# Patient Record
Sex: Female | Born: 1980
Health system: Southern US, Community
[De-identification: ages and names within clinical notes are randomized; demographics above are authoritative.]

## PROBLEM LIST (undated history)

## (undated) ENCOUNTER — Inpatient Hospital Stay (HOSPITAL_COMMUNITY): Payer: Self-pay

## (undated) DIAGNOSIS — F419 Anxiety disorder, unspecified: Secondary | ICD-10-CM

## (undated) DIAGNOSIS — R112 Nausea with vomiting, unspecified: Secondary | ICD-10-CM

## (undated) DIAGNOSIS — E669 Obesity, unspecified: Secondary | ICD-10-CM

## (undated) DIAGNOSIS — I499 Cardiac arrhythmia, unspecified: Secondary | ICD-10-CM

## (undated) DIAGNOSIS — B019 Varicella without complication: Secondary | ICD-10-CM

## (undated) DIAGNOSIS — Z9889 Other specified postprocedural states: Secondary | ICD-10-CM

## (undated) DIAGNOSIS — E785 Hyperlipidemia, unspecified: Secondary | ICD-10-CM

## (undated) DIAGNOSIS — J45909 Unspecified asthma, uncomplicated: Secondary | ICD-10-CM

## (undated) DIAGNOSIS — K219 Gastro-esophageal reflux disease without esophagitis: Secondary | ICD-10-CM

## (undated) DIAGNOSIS — R87629 Unspecified abnormal cytological findings in specimens from vagina: Secondary | ICD-10-CM

## (undated) HISTORY — DX: Unspecified asthma, uncomplicated: J45.909

## (undated) HISTORY — DX: Anxiety disorder, unspecified: F41.9

## (undated) HISTORY — DX: Varicella without complication: B01.9

## (undated) HISTORY — DX: Hyperlipidemia, unspecified: E78.5

## (undated) HISTORY — DX: Unspecified abnormal cytological findings in specimens from vagina: R87.629

## (undated) HISTORY — DX: Obesity, unspecified: E66.9

---
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
## 2002-09-02 ENCOUNTER — Emergency Department (HOSPITAL_COMMUNITY): Admission: EM | Admit: 2002-09-02 | Discharge: 2002-09-02 | Payer: Self-pay | Admitting: Emergency Medicine

## 2004-07-28 ENCOUNTER — Ambulatory Visit (HOSPITAL_COMMUNITY): Admission: RE | Admit: 2004-07-28 | Discharge: 2004-07-28 | Payer: Self-pay | Admitting: Obstetrics & Gynecology

## 2004-12-12 ENCOUNTER — Ambulatory Visit (HOSPITAL_COMMUNITY): Admission: RE | Admit: 2004-12-12 | Discharge: 2004-12-12 | Payer: Self-pay | Admitting: Obstetrics & Gynecology

## 2004-12-13 ENCOUNTER — Inpatient Hospital Stay (HOSPITAL_COMMUNITY): Admission: RE | Admit: 2004-12-13 | Discharge: 2004-12-17 | Payer: Self-pay | Admitting: Obstetrics

## 2005-12-17 HISTORY — PX: CHOLECYSTECTOMY: SHX55

## 2006-01-26 ENCOUNTER — Observation Stay (HOSPITAL_COMMUNITY): Admission: EM | Admit: 2006-01-26 | Discharge: 2006-01-27 | Payer: Self-pay | Admitting: Emergency Medicine

## 2012-08-11 ENCOUNTER — Ambulatory Visit (INDEPENDENT_AMBULATORY_CARE_PROVIDER_SITE_OTHER): Payer: Federal, State, Local not specified - PPO | Admitting: Family Medicine

## 2012-08-11 ENCOUNTER — Encounter: Payer: Self-pay | Admitting: Family Medicine

## 2012-08-11 ENCOUNTER — Other Ambulatory Visit (HOSPITAL_COMMUNITY)
Admission: RE | Admit: 2012-08-11 | Discharge: 2012-08-11 | Disposition: A | Discharge status: Home / Self Care | Payer: Federal, State, Local not specified - PPO | Source: Ambulatory Visit | Attending: Family Medicine | Admitting: Family Medicine

## 2012-08-11 VITALS — BP 118/78 | HR 81 | Temp 99.2°F | Ht 63.25 in | Wt 200.6 lb

## 2012-08-11 DIAGNOSIS — L309 Dermatitis, unspecified: Secondary | ICD-10-CM | POA: Insufficient documentation

## 2012-08-11 DIAGNOSIS — Z01419 Encounter for gynecological examination (general) (routine) without abnormal findings: Secondary | ICD-10-CM

## 2012-08-11 DIAGNOSIS — Z6829 Body mass index (BMI) 29.0-29.9, adult: Secondary | ICD-10-CM | POA: Insufficient documentation

## 2012-08-11 DIAGNOSIS — L259 Unspecified contact dermatitis, unspecified cause: Secondary | ICD-10-CM

## 2012-08-11 DIAGNOSIS — E669 Obesity, unspecified: Secondary | ICD-10-CM

## 2012-08-11 DIAGNOSIS — N644 Mastodynia: Secondary | ICD-10-CM

## 2012-08-11 DIAGNOSIS — Z124 Encounter for screening for malignant neoplasm of cervix: Secondary | ICD-10-CM

## 2012-08-11 LAB — CBC WITH DIFFERENTIAL/PLATELET
Basophils Absolute: 0 10*3/uL (ref 0.0–0.1)
Basophils Relative: 0.7 % (ref 0.0–3.0)
Eosinophils Absolute: 0.1 10*3/uL (ref 0.0–0.7)
Eosinophils Relative: 1.6 % (ref 0.0–5.0)
HCT: 36.7 % (ref 36.0–46.0)
Hemoglobin: 11.8 g/dL — ABNORMAL LOW (ref 12.0–15.0)
Lymphocytes Relative: 27.9 % (ref 12.0–46.0)
Lymphs Abs: 1.5 10*3/uL (ref 0.7–4.0)
MCHC: 32.1 g/dL (ref 30.0–36.0)
MCV: 83.9 fl (ref 78.0–100.0)
Monocytes Absolute: 0.5 10*3/uL (ref 0.1–1.0)
Monocytes Relative: 10 % (ref 3.0–12.0)
Neutro Abs: 3.3 10*3/uL (ref 1.4–7.7)
Neutrophils Relative %: 59.8 % (ref 43.0–77.0)
Platelets: 292 10*3/uL (ref 150.0–400.0)
RBC: 4.37 Mil/uL (ref 3.87–5.11)
RDW: 13.3 % (ref 11.5–14.6)
WBC: 5.4 10*3/uL (ref 4.5–10.5)

## 2012-08-11 LAB — LIPID PANEL
Cholesterol: 195 mg/dL (ref 0–200)
HDL: 45.8 mg/dL (ref >39.00)
LDL Cholesterol: 141 mg/dL — ABNORMAL HIGH (ref 0–99)
Total CHOL/HDL Ratio: 4
Triglycerides: 42 mg/dL (ref 0.0–149.0)
VLDL: 8.4 mg/dL (ref 0.0–40.0)

## 2012-08-11 LAB — BASIC METABOLIC PANEL
BUN: 8 mg/dL (ref 6–23)
CO2: 22 mEq/L (ref 19–32)
Calcium: 9 mg/dL (ref 8.4–10.5)
Chloride: 107 mEq/L (ref 96–112)
Creatinine, Ser: 0.6 mg/dL (ref 0.4–1.2)
GFR: 141.28 mL/min (ref >60.00)
Glucose, Bld: 85 mg/dL (ref 70–99)
Potassium: 3.7 mEq/L (ref 3.5–5.1)
Sodium: 139 mEq/L (ref 135–145)

## 2012-08-11 LAB — HEPATIC FUNCTION PANEL
ALT: 28 U/L (ref 0–35)
AST: 24 U/L (ref 0–37)
Albumin: 3.7 g/dL (ref 3.5–5.2)
Alkaline Phosphatase: 92 U/L (ref 39–117)
Bilirubin, Direct: 0.1 mg/dL (ref 0.0–0.3)
Total Bilirubin: 0.4 mg/dL (ref 0.3–1.2)
Total Protein: 7 g/dL (ref 6.0–8.3)

## 2012-08-11 LAB — TSH: TSH: 1.82 u[IU]/mL (ref 0.35–5.50)

## 2012-08-11 MED ORDER — TRIAMCINOLONE ACETONIDE 0.1 % EX OINT: TOPICAL_OINTMENT | Freq: Two times a day (BID) | CUTANEOUS | Status: DC

## 2012-08-11 NOTE — Assessment & Plan Note (Signed)
New to provider.  Chronic for pt.  Encouraged healthy diet, regular eerxerc

## 2012-08-11 NOTE — Assessment & Plan Note (Signed)
New.  Start triamcinolone prn.

## 2012-08-11 NOTE — Progress Notes (Signed)
  Subjective:    Patient ID: Savannah Henry, female    DOB: 16-Aug-1981, 31 y.o.   MRN: [id number]  HPI New to establish.  Previous MD- [facility] [place], [place]  Wants CPE.  Reports breast tenderness over the nipples bilaterally.  No discharge, no lumps, no skin changes.  MGM and maternal aunts w/ breast cancer- dx'd in 40-50s.  Review of Systems Patient reports no vision/ hearing changes, adenopathy,fever, weight change,  persistant/recurrent hoarseness , swallowing issues, chest pain, palpitations, edema, persistant/recurrent cough, hemoptysis, dyspnea (rest/exertional/paroxysmal nocturnal), gastrointestinal bleeding (melena, rectal bleeding), abdominal pain, significant heartburn, bowel changes, GU symptoms (dysuria, hematuria, incontinence), Gyn symptoms (abnormal  bleeding, pain),  syncope, focal weakness, memory loss, numbness & tingling, skin/hair/nail changes, abnormal bruising or bleeding, anxiety, or depression.     Objective:   Physical Exam  General Appearance:    Alert, cooperative, no distress, appears stated age  Head:    Normocephalic, without obvious abnormality, atraumatic  Eyes:    PERRL, conjunctiva/corneas clear, EOM's intact, fundi    benign, both eyes  Ears:    Normal TM's and external ear canals, both ears  Nose:   Nares normal, septum midline, mucosa normal, no drainage    or sinus tenderness  Throat:   Lips, mucosa, and tongue normal; teeth and gums normal  Neck:   Supple, symmetrical, trachea midline, no adenopathy;    Thyroid: no enlargement/tenderness/nodules  Back:     Symmetric, no curvature, ROM normal, no CVA tenderness  Lungs:     Clear to auscultation bilaterally, respirations unlabored  Chest Wall:    No tenderness or deformity   Heart:    Regular rate and rhythm, S1 and S2 normal, no murmur, rub   or gallop  Breast Exam:    No tenderness, masses, or nipple abnormality  Abdomen:     Soft, non-tender, bowel sounds active all four quadrants,    no masses, no  organomegaly  Genitalia:    External genitalia normal, cervix normal in appearance, no CMT, uterus in normal size and position, adnexa w/out mass or tenderness, mucosa pink and moist, no lesions or discharge present  Rectal:    Normal external appearance  Extremities:   Extremities normal, atraumatic, no cyanosis or edema  Pulses:   2+ and symmetric all extremities  Skin:   Skin color, texture, turgor normal, eczematous patch on R lateral lower leg  Lymph nodes:   Cervical, supraclavicular, and axillary nodes normal  Neurologic:   CNII-XII intact, normal strength, sensation and reflexes    throughout          Assessment & Plan:

## 2012-08-11 NOTE — Patient Instructions (Addendum)
We'll notify you of your lab results and make any changes if needed Someone will call you with your mammogram and nutrition appts Try and get regular exercise and make healthy food choices Call with any questions or concerns Welcome!  We're glad to have you!

## 2012-08-11 NOTE — Assessment & Plan Note (Signed)
New.  No abnormalities on PE but given family hx of breast cancer at young age will get diagnostic mammo.  Pt expressed understanding and is in agreement w/ plan.

## 2012-08-13 LAB — VITAMIN D 1,25 DIHYDROXY
Vitamin D 1, 25 (OH)2 Total: 78 pg/mL — ABNORMAL HIGH (ref 18–72)
Vitamin D2 1, 25 (OH)2: <8 pg/mL
Vitamin D3 1, 25 (OH)2: 78 pg/mL

## 2012-08-15 ENCOUNTER — Ambulatory Visit
Admission: RE | Admit: 2012-08-15 | Discharge: 2012-08-15 | Disposition: A | Discharge status: Home / Self Care | Payer: Federal, State, Local not specified - PPO | Source: Ambulatory Visit | Attending: Family Medicine | Admitting: Family Medicine

## 2012-08-15 ENCOUNTER — Encounter: Payer: Self-pay | Admitting: *Deleted

## 2012-08-15 DIAGNOSIS — N644 Mastodynia: Secondary | ICD-10-CM

## 2012-08-24 NOTE — Assessment & Plan Note (Signed)
Pap collected. 

## 2012-08-24 NOTE — Assessment & Plan Note (Signed)
New.  Pt's PE WNL w/ exception of breast tenderness.  Check labs.  Anticipatory guidance provided.

## 2012-09-25 ENCOUNTER — Encounter: Payer: Self-pay | Admitting: *Deleted

## 2012-09-25 ENCOUNTER — Encounter: Payer: Federal, State, Local not specified - PPO | Attending: Family Medicine | Admitting: *Deleted

## 2012-09-25 VITALS — Ht 62.75 in | Wt 203.0 lb

## 2012-09-25 DIAGNOSIS — E669 Obesity, unspecified: Secondary | ICD-10-CM

## 2012-09-25 DIAGNOSIS — Z713 Dietary counseling and surveillance: Secondary | ICD-10-CM | POA: Insufficient documentation

## 2012-09-25 NOTE — Patient Instructions (Signed)
Eat meals that you prepare for your daughter  With your daughter

## 2012-09-25 NOTE — Progress Notes (Signed)
  Medical Nutrition Therapy:  Appt start time: 1730 end time:  1830.   Assessment:  Primary concerns today: obesity.   MEDICATIONS: see list   DIETARY INTAKE:  Usual eating pattern includes 0-3 meals and multiple snacks per day.  Everyday foods include energy-dense snacks.  Avoided foods include none.    24-hr recall:  B ( AM): may skip; [place] breakfast biscuit with egg, cheese, bacon and hash brown and sweet tea  Snk ( AM): may grab doughnut or danish or other pastry  L ( PM): chips with cucumber salad and gyro meat with water; or may skip Snk ( PM): vending machine chips or fast food D ( PM): snacks while cooking and does eat dinner  Or may nibble child's dinner Snk ( PM): none Beverages: tea or water or may not drink anything  Usual physical activity: none  Estimated energy needs: 1500 calories 170 g carbohydrates 112 g protein 42 g fat  Progress Towards Goal(s):  In progress.   Nutritional Diagnosis:  [place]-3.3 Overweight/obesity As related to irregular meal pattern and consumption of enery-dense foods.  As evidenced by BMI of 36.2.    Intervention:  Nutrition counseling provided.  Savannah Henry is here because she is obese.  She admits to gaining about 20 pounds in 10 months due to job change and other personal stresses.  She admits to emotional eating and making energy-dense food choices when she is upset or emotional.  She has tried various fad diets and intense exercise routines in the past, but gained the weight back.  Explained to her the effects of restrictive diets and intense exercise routines: decreased caloric intake so low that metabolic rate slows down and fat storage capacity increases so that when she does eat again- goes off diet or stops exercising- she gains more weight and more weight as fat. Savannah Henry's current meal pattern is reminiscent of starvation.  She skips at least 2 meals a day and then binges when she's so hungry she can't help it.  She admits to low  energy level.  She has a young daughter, whom she takes excellent care of.  She makes sure that her daughter has a nutritious breakfast, lunch, and dinner.  She makes sure her daughter stays properly hydrated.  She takes the time for her daughter, but doesn't do the same thing for herself.  She will watch her daughter eat the meal she prepares, but won't eat with her.  Challenged her to take care of herself too.  Told Savannah Henry that she is just as important as her daughter and that in order to take good care of her daughter, she needs to be able to take good care of herself.  Asked the daughter (who was at the appointment today) would she please be willing to help her mother prepare meals so that Savannah Henry might have time to eat herself?  Savannah Henry already is preparing 3 meals a day, encouraged her to double the portions so that she may eat as well.  Encouraged her to plan ahead and prepare meals in advance, if needed, so that she will have nutritious meals ready and wont' have to deny or delay eating until she's so hungry she binges on energy-dense, nutrient-poor foods.  Reassured her that she is worth the effort, empowered her to make healthy meal choices and solicited daughter's help in encouraging Savannah Henry to eat at meal times.    Monitoring/Evaluation:  Dietary intake, exercise, and body weight prn.

## 2012-10-09 ENCOUNTER — Encounter: Payer: Self-pay | Admitting: Family Medicine

## 2012-10-09 ENCOUNTER — Ambulatory Visit (INDEPENDENT_AMBULATORY_CARE_PROVIDER_SITE_OTHER): Payer: Federal, State, Local not specified - PPO | Admitting: Family Medicine

## 2012-10-09 VITALS — BP 135/96 | HR 86 | Temp 98.4°F | Ht 63.5 in | Wt 200.6 lb

## 2012-10-09 DIAGNOSIS — F418 Other specified anxiety disorders: Secondary | ICD-10-CM | POA: Insufficient documentation

## 2012-10-09 DIAGNOSIS — F341 Dysthymic disorder: Secondary | ICD-10-CM

## 2012-10-09 MED ORDER — ESCITALOPRAM OXALATE 10 MG PO TABS: 10.0000 mg | ORAL_TABLET | Freq: Every day | ORAL | Status: DC

## 2012-10-09 MED ORDER — ALPRAZOLAM 0.5 MG PO TABS: 0.5000 mg | ORAL_TABLET | Freq: Three times a day (TID) | ORAL | Status: DC | PRN

## 2012-10-09 NOTE — Patient Instructions (Addendum)
Follow up in 1 month to recheck mood Start Lexapro daily to control depression/anxiety Use the Xanax as needed for those panic moments- 1/2 or whole tab Continue to exercise as a stress outlet Call with any questions or concerns Hang in there!!!

## 2012-10-09 NOTE — Progress Notes (Signed)
  Subjective:    Patient ID: Savannah Henry, female    DOB: 23-Aug-1981, 31 y.o.   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  Pt was working w/out getting paid.  Was supposed to get back pay on Monday but did not get full amount.  Started having palpitations, severe HA- 'it's just too much'.  Is living 'check to check' and lack of pay is huge strain.  Work is expecting more from pt.  'i can't stop crying' including at work.  Is feeling unprofessional.  Typically sleeping well but lately has been waking between 1-2 am.  Able to go back to sleep but not having restful sleep.  Pt w/ hx of anxiety/depression.  'i don't want to be that person that needs meds'.   Review of Systems     Objective:   Physical Exam  Vitals reviewed. Constitutional: She is oriented to person, place, and time. She appears well-developed and well-nourished.       tearful  HENT:  Head: Normocephalic and atraumatic.  Neck: Normal range of motion. Neck supple. No thyromegaly present.  Cardiovascular: Normal rate, regular rhythm and normal heart sounds.   Pulmonary/Chest: Effort normal and breath sounds normal. No respiratory distress. She has no wheezes. She has no rales.  Musculoskeletal: She exhibits no edema.  Neurological: She is alert and oriented to person, place, and time.  Psychiatric:       Anxious, tearful          Assessment & Plan:

## 2012-10-09 NOTE — Assessment & Plan Note (Signed)
New.  Severe.  Keeping pt from functioning at work.  Will start daily controller med and add benzo prn.  Will follow closely.  Pt expressed understanding and is in agreement w/ plan.

## 2012-12-11 ENCOUNTER — Encounter: Payer: Self-pay | Admitting: Family Medicine

## 2012-12-11 ENCOUNTER — Ambulatory Visit (INDEPENDENT_AMBULATORY_CARE_PROVIDER_SITE_OTHER): Payer: Federal, State, Local not specified - PPO | Admitting: Family Medicine

## 2012-12-11 VITALS — BP 108/70 | HR 82 | Temp 98.6°F | Ht 63.75 in | Wt 206.6 lb

## 2012-12-11 DIAGNOSIS — H9209 Otalgia, unspecified ear: Secondary | ICD-10-CM

## 2012-12-11 DIAGNOSIS — N912 Amenorrhea, unspecified: Secondary | ICD-10-CM

## 2012-12-11 LAB — POCT URINE PREGNANCY: Preg Test, Ur: POSITIVE

## 2012-12-11 NOTE — Progress Notes (Signed)
  Subjective:    Patient ID: Savannah Henry, female    DOB: [birth year], 31 y.o.   MRN: [id number]  HPI Ear pain- R ear pain, sxs started 1 week ago.  Some decreased hearing, swelling externally.  Felt a bump in EAC but this resolved.  Difficulty lying on side b/c ear feels like it's filling w/ fluid.  No drainage from ear.  No fevers.  No increase in nasal congestion or sinus drainage.  + sick contacts- daughter w/ ear infxn.  Amenorrhea- pt's last period was 10/28/12.  Not currently on birth control.  'maybe we better check a test'.   Review of Systems For ROS see HPI     Objective:   Physical Exam  Vitals reviewed. Constitutional: She is oriented to person, place, and time. She appears well-developed and well-nourished. No distress.  HENT:  Head: Normocephalic and atraumatic.  Right Ear: Tympanic membrane normal.  Left Ear: Tympanic membrane normal.  Nose: Mucosal edema and rhinorrhea present. Right sinus exhibits no maxillary sinus tenderness and no frontal sinus tenderness. Left sinus exhibits no maxillary sinus tenderness and no frontal sinus tenderness.  Mouth/Throat: Mucous membranes are normal. Posterior oropharyngeal erythema (w/ PND) present.       R ear w/ cerumen impaction- TM WNL s/p irrigation and curetting L TM WNL  Eyes: Conjunctivae normal and EOM are normal. Pupils are equal, round, and reactive to light.  Neck: Normal range of motion. Neck supple.  Cardiovascular: Normal rate, regular rhythm and normal heart sounds.   Pulmonary/Chest: Effort normal and breath sounds normal. No respiratory distress. She has no wheezes. She has no rales.  Lymphadenopathy:    She has no cervical adenopathy.  Neurological: She is alert and oriented to person, place, and time.  Psychiatric: She has a normal mood and affect. Her behavior is normal. Thought content normal.          Assessment & Plan:

## 2012-12-11 NOTE — Patient Instructions (Addendum)
The hearing loss and ear pain was b/c the wax was preventing the ear drum from moving the way it was supposed and could not regulate the pressure Drink plenty of fluids Tylenol for pain We'll call you with your GYN appt CONGRATS!!! Happy New Year!!!

## 2012-12-12 NOTE — Assessment & Plan Note (Signed)
New.  Pt's discomfort and muffled hearing improved w/ irrigation and removal of cerumen impaction.  Reviewed supportive care and red flags that should prompt return.  Pt expressed understanding and is in agreement w/ plan.

## 2012-12-12 NOTE — Assessment & Plan Note (Signed)
New.  Pt is pregnant.  No local OB.  Will refer.  Encouraged her to start daily OTC PNV.  Pt is shocked but pleased.

## 2012-12-15 ENCOUNTER — Telehealth: Payer: Self-pay | Admitting: Family Medicine

## 2012-12-15 NOTE — Telephone Encounter (Signed)
Estimated due date- Aug 04, 2013

## 2012-12-15 NOTE — Telephone Encounter (Signed)
Patient states she has made appt w/Dr. Tamela Oddi at [facility]. The physician's office is also asking for an estimated due date. Call patient back at [phone]

## 2012-12-16 NOTE — Telephone Encounter (Signed)
Discuss with patient  

## 2012-12-30 ENCOUNTER — Ambulatory Visit (INDEPENDENT_AMBULATORY_CARE_PROVIDER_SITE_OTHER): Payer: Federal, State, Local not specified - PPO

## 2012-12-30 DIAGNOSIS — Z23 Encounter for immunization: Secondary | ICD-10-CM

## 2013-01-12 LAB — OB RESULTS CONSOLE ABO/RH: RH Type: POSITIVE

## 2013-01-12 LAB — OB RESULTS CONSOLE PLATELET COUNT: Platelets: 297 10*3/uL

## 2013-01-12 LAB — OB RESULTS CONSOLE HIV ANTIBODY (ROUTINE TESTING): HIV: NONREACTIVE

## 2013-01-12 LAB — OB RESULTS CONSOLE GC/CHLAMYDIA
Chlamydia: NEGATIVE
Gonorrhea: NEGATIVE

## 2013-01-12 LAB — OB RESULTS CONSOLE HGB/HCT, BLOOD: HCT: 38 %

## 2013-01-12 LAB — OB RESULTS CONSOLE RPR: RPR: NONREACTIVE

## 2013-01-12 LAB — OB RESULTS CONSOLE RUBELLA ANTIBODY, IGM: Rubella: IMMUNE

## 2013-01-12 LAB — OB RESULTS CONSOLE HEPATITIS B SURFACE ANTIGEN: Hepatitis B Surface Ag: NEGATIVE

## 2013-02-23 ENCOUNTER — Other Ambulatory Visit: Payer: Self-pay | Admitting: Obstetrics & Gynecology

## 2013-02-23 DIAGNOSIS — Z3480 Encounter for supervision of other normal pregnancy, unspecified trimester: Secondary | ICD-10-CM

## 2013-03-03 ENCOUNTER — Ambulatory Visit (INDEPENDENT_AMBULATORY_CARE_PROVIDER_SITE_OTHER): Payer: BC Managed Care – PPO

## 2013-03-03 DIAGNOSIS — Z3480 Encounter for supervision of other normal pregnancy, unspecified trimester: Secondary | ICD-10-CM

## 2013-03-03 LAB — US OB DETAIL + 14 WK

## 2013-03-06 ENCOUNTER — Encounter: Payer: Self-pay | Admitting: Obstetrics & Gynecology

## 2013-03-06 DIAGNOSIS — Z3482 Encounter for supervision of other normal pregnancy, second trimester: Secondary | ICD-10-CM

## 2013-03-20 ENCOUNTER — Encounter (HOSPITAL_COMMUNITY): Payer: Self-pay | Admitting: *Deleted

## 2013-03-20 ENCOUNTER — Inpatient Hospital Stay (HOSPITAL_COMMUNITY)
Admission: AD | Admit: 2013-03-20 | Discharge: 2013-03-20 | Disposition: A | Discharge status: Home / Self Care | Payer: Federal, State, Local not specified - PPO | Source: Ambulatory Visit | Attending: Obstetrics | Admitting: Obstetrics

## 2013-03-20 DIAGNOSIS — R109 Unspecified abdominal pain: Secondary | ICD-10-CM | POA: Insufficient documentation

## 2013-03-20 DIAGNOSIS — B9689 Other specified bacterial agents as the cause of diseases classified elsewhere: Secondary | ICD-10-CM | POA: Insufficient documentation

## 2013-03-20 DIAGNOSIS — O2390 Unspecified genitourinary tract infection in pregnancy, unspecified trimester: Secondary | ICD-10-CM | POA: Insufficient documentation

## 2013-03-20 DIAGNOSIS — N760 Acute vaginitis: Secondary | ICD-10-CM | POA: Insufficient documentation

## 2013-03-20 DIAGNOSIS — A499 Bacterial infection, unspecified: Secondary | ICD-10-CM | POA: Insufficient documentation

## 2013-03-20 LAB — URINALYSIS, ROUTINE W REFLEX MICROSCOPIC
Bilirubin Urine: NEGATIVE
Glucose, UA: NEGATIVE mg/dL
Hgb urine dipstick: NEGATIVE
Ketones, ur: 15 mg/dL — AB
Leukocytes, UA: NEGATIVE
Nitrite: NEGATIVE
Protein, ur: NEGATIVE mg/dL
Specific Gravity, Urine: >1.03 — ABNORMAL HIGH (ref 1.005–1.030)
Urobilinogen, UA: 0.2 mg/dL (ref 0.0–1.0)
pH: 5.5 (ref 5.0–8.0)

## 2013-03-20 LAB — WET PREP, GENITAL
Trich, Wet Prep: NONE SEEN
Yeast Wet Prep HPF POC: NONE SEEN

## 2013-03-20 MED ORDER — ACETAMINOPHEN 500 MG PO TABS
1000.0000 mg | ORAL_TABLET | Freq: Once | ORAL | Status: AC
  Administered 2013-03-20: 1000 mg via ORAL
  Filled 2013-03-20: qty 2

## 2013-03-20 MED ORDER — METRONIDAZOLE 500 MG PO TABS: 500.0000 mg | ORAL_TABLET | Freq: Two times a day (BID) | ORAL | Status: DC

## 2013-03-20 NOTE — MAU Provider Note (Signed)
History     CSN: [id number]  Arrival date and time: 03/20/13 2003   First Provider Initiated Contact with Patient 03/20/13 2043      Chief Complaint  Patient presents with  . Flank Pain  . Abdominal Pain   HPI  Savannah Henry is a 32 y.o. G3P1011 at [redacted]w[redacted]d who presents today with abdominal pain X 48 hours. She states she took Tylenol today, and it did not help. She denies LOF and VB.   Past Medical History  Diagnosis Date  . Asthma   . Chicken pox   . Obesity   . Hyperlipidemia   . Anxiety     Past Surgical History  Procedure Laterality Date  . Cholecystectomy  2007  . Cesarean section  2005    Family History  Problem Relation Age of Onset  . Arthritis Mother   . ALS Father   . Breast cancer Maternal Aunt   . Lung cancer Maternal Uncle   . Diabetes Paternal Aunt   . Arthritis Maternal Grandmother   . Breast cancer Maternal Grandmother   . Heart disease Paternal Grandmother   . Hypertension Paternal Grandmother   . Diabetes Paternal Grandfather     History  Substance Use Topics  . Smoking status: Never Smoker   . Smokeless tobacco: Never Used  . Alcohol Use: No    Allergies: No Known Allergies  Prescriptions prior to admission  Medication Sig Dispense Refill  . acetaminophen (TYLENOL) 500 MG tablet Take 1,000 mg by mouth every 6 (six) hours as needed for pain.      . Prenatal Vit-Fe Fumarate-FA (PRENATAL MULTIVITAMIN) TABS Take 1 tablet by mouth daily at 12 noon.        Review of Systems  Constitutional: Negative for fever and chills.  Eyes: Negative for blurred vision.  Gastrointestinal: Positive for abdominal pain and constipation (last BM was yesterday). Negative for nausea, vomiting and diarrhea.  Genitourinary: Negative for dysuria, urgency and frequency.  Musculoskeletal: Negative for myalgias.  Neurological: Negative for dizziness and headaches.   Physical Exam   Blood pressure 116/64, pulse 82, temperature 98.4 F (36.9 C), resp. rate 20,  height 5' 2.5" (1.588 m), weight 88.179 kg (194 lb 6.4 oz), last menstrual period 10/28/2012, SpO2 100.00%.  Physical Exam  Nursing note and vitals reviewed. Constitutional: She is oriented to person, place, and time. She appears well-developed and well-nourished.  Cardiovascular: Normal rate.   Respiratory: Effort normal.  GI: Soft.  Neurological: She is alert and oriented to person, place, and time.  Skin: Skin is warm and dry.  Psychiatric: She has a normal mood and affect.    MAU Course  Procedures  Results for orders placed during the hospital encounter of 03/20/13 (from the past 24 hour(s))  URINALYSIS, ROUTINE W REFLEX MICROSCOPIC     Status: Abnormal   Collection Time    03/20/13  8:20 PM      Result Value Range   Color, Urine YELLOW  YELLOW   APPearance CLEAR  CLEAR   Specific Gravity, Urine >1.030 (*) 1.005 - 1.030   pH 5.5  5.0 - 8.0   Glucose, UA NEGATIVE  NEGATIVE mg/dL   Hgb urine dipstick NEGATIVE  NEGATIVE   Bilirubin Urine NEGATIVE  NEGATIVE   Ketones, ur 15 (*) NEGATIVE mg/dL   Protein, ur NEGATIVE  NEGATIVE mg/dL   Urobilinogen, UA 0.2  0.0 - 1.0 mg/dL   Nitrite NEGATIVE  NEGATIVE   Leukocytes, UA NEGATIVE  NEGATIVE  WET PREP,  GENITAL     Status: Abnormal   Collection Time    03/20/13  9:00 PM      Result Value Range   Yeast Wet Prep HPF POC NONE SEEN  NONE SEEN   Trich, Wet Prep NONE SEEN  NONE SEEN   Clue Cells Wet Prep HPF POC MODERATE (*) NONE SEEN   WBC, Wet Prep HPF POC MODERATE (*) NONE SEEN   2223: Spoke with Dr. Clearance Coots, treat BV and DC patient home.   Assessment and Plan   1. BV (bacterial vaginosis)    RX flagyl 500mg  po bid x 7 days FU with Dr. Clearance Coots as scheduled  Tawnya Crook 03/20/2013, 8:44 PM

## 2013-03-20 NOTE — MAU Note (Signed)
Pain in R flank that wraps around to R lower abdomen. Tylenol and heat not helping. Started last night.

## 2013-03-21 LAB — GC/CHLAMYDIA PROBE AMP
CT Probe RNA: NEGATIVE
GC Probe RNA: NEGATIVE

## 2013-04-01 ENCOUNTER — Ambulatory Visit (INDEPENDENT_AMBULATORY_CARE_PROVIDER_SITE_OTHER): Payer: BC Managed Care – PPO

## 2013-04-01 DIAGNOSIS — Z3482 Encounter for supervision of other normal pregnancy, second trimester: Secondary | ICD-10-CM

## 2013-04-01 DIAGNOSIS — Z3480 Encounter for supervision of other normal pregnancy, unspecified trimester: Secondary | ICD-10-CM

## 2013-04-01 LAB — US OB DETAIL + 14 WK

## 2013-04-02 ENCOUNTER — Encounter: Payer: Self-pay | Admitting: Obstetrics & Gynecology

## 2013-04-09 ENCOUNTER — Ambulatory Visit (INDEPENDENT_AMBULATORY_CARE_PROVIDER_SITE_OTHER): Payer: BC Managed Care – PPO | Admitting: Obstetrics & Gynecology

## 2013-04-09 ENCOUNTER — Encounter: Payer: Self-pay | Admitting: Obstetrics & Gynecology

## 2013-04-09 VITALS — BP 106/70 | Temp 97.8°F | Wt 196.0 lb

## 2013-04-09 DIAGNOSIS — B373 Candidiasis of vulva and vagina: Secondary | ICD-10-CM

## 2013-04-09 DIAGNOSIS — Z3482 Encounter for supervision of other normal pregnancy, second trimester: Secondary | ICD-10-CM

## 2013-04-09 DIAGNOSIS — Z98891 History of uterine scar from previous surgery: Secondary | ICD-10-CM

## 2013-04-09 DIAGNOSIS — E559 Vitamin D deficiency, unspecified: Secondary | ICD-10-CM

## 2013-04-09 DIAGNOSIS — Z3480 Encounter for supervision of other normal pregnancy, unspecified trimester: Secondary | ICD-10-CM

## 2013-04-09 DIAGNOSIS — Z9889 Other specified postprocedural states: Secondary | ICD-10-CM

## 2013-04-09 LAB — POCT URINALYSIS DIPSTICK
Bilirubin, UA: NEGATIVE
Blood, UA: NEGATIVE
Glucose, UA: NEGATIVE
Leukocytes, UA: NEGATIVE
Nitrite, UA: NEGATIVE
Spec Grav, UA: 1.02
Urobilinogen, UA: NEGATIVE
pH, UA: 5

## 2013-04-09 MED ORDER — TERCONAZOLE 0.4 % VA CREA: 1.0000 | TOPICAL_CREAM | Freq: Every day | VAGINAL | Status: DC

## 2013-04-09 NOTE — Progress Notes (Signed)
Pulse: 90

## 2013-04-09 NOTE — Patient Instructions (Signed)
Glucose Tolerance Test This is a test to see how your body processes carbohydrates. This test is often done to check patients for diabetes or the possibility of developing it. PREPARATION FOR TEST You should have nothing to eat or drink 12 hours before the test. You will be given a form of sugar (glucose) and then blood samples will be drawn from your vein to determine the level of sugar in your blood. Alternatively, blood may be drawn from your finger for testing. You should not smoke or exercise during the test. NORMAL FINDINGS  Fasting: 70-115 mg/dL  30 minutes: less than 200 mg/dL  1 hour: less than 200 mg/dL  2 hours: less than 140 mg/dL  3 hours: 70-115 mg/dL  4 hours: 70-115 mg/dL Ranges for normal findings may vary among different laboratories and hospitals. You should always check with your doctor after having lab work or other tests done to discuss the meaning of your test results and whether your values are considered within normal limits. MEANING OF TEST Your caregiver will go over the test results with you and discuss the importance and meaning of your results, as well as treatment options and the need for additional tests. OBTAINING THE TEST RESULTS It is your responsibility to obtain your test results. Ask the lab or department performing the test when and how you will get your results. Document Released: 12/26/2004 Document Revised: 02/25/2012 Document Reviewed: 11/13/2008 ExitCare Patient Information 2013 [employer].  

## 2013-04-09 NOTE — Progress Notes (Signed)
Likely candida vulvovaginitis. 

## 2013-04-14 ENCOUNTER — Encounter: Payer: Self-pay | Admitting: *Deleted

## 2013-05-01 ENCOUNTER — Ambulatory Visit (INDEPENDENT_AMBULATORY_CARE_PROVIDER_SITE_OTHER): Payer: BC Managed Care – PPO | Admitting: Obstetrics & Gynecology

## 2013-05-01 ENCOUNTER — Other Ambulatory Visit: Payer: BC Managed Care – PPO

## 2013-05-01 ENCOUNTER — Encounter: Payer: Self-pay | Admitting: Obstetrics & Gynecology

## 2013-05-01 VITALS — BP 109/62 | Temp 97.1°F | Wt 196.0 lb

## 2013-05-01 DIAGNOSIS — Z3480 Encounter for supervision of other normal pregnancy, unspecified trimester: Secondary | ICD-10-CM

## 2013-05-01 DIAGNOSIS — Z3482 Encounter for supervision of other normal pregnancy, second trimester: Secondary | ICD-10-CM

## 2013-05-01 LAB — POCT URINALYSIS DIPSTICK
Glucose, UA: NEGATIVE
Leukocytes, UA: NEGATIVE
Urobilinogen, UA: NEGATIVE

## 2013-05-01 LAB — CBC
MCH: 27.2 pg (ref 26.0–34.0)
MCV: 81 fL (ref 78.0–100.0)
Platelets: 275 10*3/uL (ref 150–400)
RDW: 14.6 % (ref 11.5–15.5)

## 2013-05-01 NOTE — Progress Notes (Signed)
Doing well 

## 2013-05-01 NOTE — Progress Notes (Signed)
Pulse 89- patient states she has no concerns

## 2013-05-01 NOTE — Patient Instructions (Signed)
Vaginal Birth After Cesarean Delivery  Vaginal birth after Cesarean delivery (VBAC) is giving birth vaginally after previously delivering a baby by a cesarean. In the past, if a woman had a Cesarean delivery, all births afterwards would be done by Cesarean delivery. This is no longer true. It can be safe for the mother to try a vaginal delivery after having a Cesarean. The final decision to have a VBAC or repeat Cesarean delivery should be between the patient and her caregiver. The risks and benefits can be discussed relative to the reason for, and the type of the previous Cesarean delivery.  WOMEN WHO PLAN TO HAVE A VBAC SHOULD CHECK WITH THEIR DOCTOR TO BE SURE THAT:  · The previous Cesarean was done with a low transverse uterine incision (not a vertical classical incision).  · The birth canal is big enough for the baby.  · There were no other operations on the uterus.  · They will have an electronic fetal monitor (EFM) on at all times during labor.  · An operating room would be available and ready in case an emergency Cesarean is needed.  · A doctor and surgical nursing staff would be available at all times during labor to be ready to do an emergency Cesarean if necessary.  · An anesthesiologist would be present in case an emergency Cesarean is needed.  · The nursery is prepared and has adequate personnel and necessary equipment available to care for the baby in case of an emergency Cesarean.  BENEFITS OF VBAC:  · Shorter stay in the hospital.  · Lower delivery, nursery and hospital costs.  · Less blood loss and need for blood transfusions.  · Less fever and discomfort from major surgery.  · Lower risk of blood clots.  · Lower risk of infection.  · Shorter recovery after going home.  · Lower risk of other surgical complications, such as opening of the incision or hernia in the incision.  · Decreased risk of injury to other organs.  · Decreased risk for having to remove the uterus (hysterectomy).  · Decreased risk  for the placenta to completely or partially cover the opening of the uterus (placenta previa) with a future pregnancy.  · Ability to have a larger family if desired.  RISKS OF A VBAC:  · Rupture of the uterus.  · Having to remove the uterus (hysterectomy) if it ruptures.  · All the complications of major surgery and/or injury to other organs.  · Excessive bleeding, blood clots and infection.  · Lower Apgar scores (method to evaluate the newborn based on appearance, pulse, grimace, activity, and respiration) and more risks to the baby.  · There is a higher risk of uterine rupture if you induce or augment labor.  · There is a higher risk of uterine rupture if you use medications to ripen the cervix.  VBAC SHOULD NOT BE DONE IF:  · The previous Cesarean was done with a vertical (classical) or T-shaped incision, or you do not know what kind of an incision was made.  · You had a ruptured uterus.  · You had surgery on your uterus.  · You have medical or obstetrical problems.  · There are problems with the baby.  · There were two previous Cesarean deliveries and no vaginal deliveries.  OTHER FACTS TO KNOW ABOUT VBAC:  · It is safe to have an epidural anesthetic with VBAC.  · It is safe to turn the baby from a breech position (attempt an external   cephalic version).  · It is safe to try a VBAC with twins.  · Pregnancies later than 40 weeks have not been successful with VBAC.  · There is an increased failure rate of a VBAC in obese pregnant women.  · There is an increased failure rate with VABC if the baby weighs 8.8 pounds (4000 grams) or more.  · There is an increased failure rate if the time between the Cesarean and VBAC is less than 19 months.  · There is an increased failure rate if pre-eclampsia is present (high blood pressure, protein in the urine and swelling of face and extremities).  · VBAC is very successful if there was a previous vaginal birth.  · VBAC is very successful when the labor starts spontaneously before  the due date.  · Delivery of VBAC is similar to having a normal spontaneous vaginal delivery.  It is important to discuss VBAC with your caregiver early in the pregnancy so you can understand the risks, benefits and options. It will give you time to decide what is best in your particular case relevant to the reason for your previous Cesarean delivery. It should be understood that medical changes in the mother or pregnancy may occur during the pregnancy, which make it necessary to change you or your caregiver's initial decision. The counseling, concerns and decisions should be documented in the medical record and signed by all parties.  Document Released: 05/26/2007 Document Revised: 02/25/2012 Document Reviewed: 01/14/2009  ExitCare® Patient Information ©2013 [employer].

## 2013-05-02 LAB — GLUCOSE TOLERANCE, 2 HOURS W/ 1HR
Glucose, 1 hour: 131 mg/dL (ref 70–170)
Glucose, Fasting: 69 mg/dL — ABNORMAL LOW (ref 70–99)

## 2013-05-04 ENCOUNTER — Other Ambulatory Visit: Payer: BC Managed Care – PPO

## 2013-05-04 ENCOUNTER — Encounter: Payer: BC Managed Care – PPO | Admitting: Obstetrics & Gynecology

## 2013-05-14 ENCOUNTER — Ambulatory Visit (INDEPENDENT_AMBULATORY_CARE_PROVIDER_SITE_OTHER): Payer: BC Managed Care – PPO | Admitting: Obstetrics

## 2013-05-14 VITALS — BP 112/74 | Temp 97.5°F | Wt 197.0 lb

## 2013-05-14 DIAGNOSIS — Z3480 Encounter for supervision of other normal pregnancy, unspecified trimester: Secondary | ICD-10-CM

## 2013-05-14 DIAGNOSIS — Z3482 Encounter for supervision of other normal pregnancy, second trimester: Secondary | ICD-10-CM

## 2013-05-14 LAB — POCT URINALYSIS DIPSTICK
Blood, UA: NEGATIVE
Ketones, UA: NEGATIVE
Spec Grav, UA: 1.02
pH, UA: 5

## 2013-05-14 NOTE — Progress Notes (Signed)
Pulse-91 Pt c/o bilateral hip pain x 1 week worse when resting.

## 2013-05-15 ENCOUNTER — Encounter: Payer: Self-pay | Admitting: Obstetrics

## 2013-05-25 ENCOUNTER — Encounter: Payer: Self-pay | Admitting: Obstetrics & Gynecology

## 2013-05-25 ENCOUNTER — Encounter: Payer: Self-pay | Admitting: *Deleted

## 2013-05-25 ENCOUNTER — Ambulatory Visit (INDEPENDENT_AMBULATORY_CARE_PROVIDER_SITE_OTHER): Payer: BC Managed Care – PPO | Admitting: Obstetrics & Gynecology

## 2013-05-25 VITALS — BP 113/74 | Temp 98.9°F | Wt 198.0 lb

## 2013-05-25 DIAGNOSIS — Z3480 Encounter for supervision of other normal pregnancy, unspecified trimester: Secondary | ICD-10-CM

## 2013-05-25 DIAGNOSIS — K219 Gastro-esophageal reflux disease without esophagitis: Secondary | ICD-10-CM

## 2013-05-25 DIAGNOSIS — Z3482 Encounter for supervision of other normal pregnancy, second trimester: Secondary | ICD-10-CM

## 2013-05-25 LAB — POCT URINALYSIS DIPSTICK
Bilirubin, UA: NEGATIVE
Blood, UA: NEGATIVE
Spec Grav, UA: 1.025
pH, UA: 5

## 2013-05-25 NOTE — Progress Notes (Signed)
P 91 Patient called today- c/o headache on R side- consistent for 2 hours and abdominal pain across front starting today

## 2013-05-25 NOTE — Progress Notes (Signed)
C/O heartburn.  Dyspepsia.

## 2013-05-25 NOTE — Patient Instructions (Addendum)
Heartburn During Pregnancy  Heartburn is a burning sensation in the chest caused by stomach acid backing up into the esophagus. Heartburn (also known as "reflux") is common in pregnancy because a certain hormone (progesterone) changes. The progesterone hormone may relax the valve that separates the esophagus from the stomach. This allows acid to go up into the esophagus, causing heartburn. Heartburn may also happen in pregnancy because the enlarging uterus pushes up on the stomach, which pushes more acid into the esophagus. This is especially true in the later stages of pregnancy. Heartburn problems usually go away after giving birth. CAUSES   The progesterone hormone.  Changing hormone levels.  The growing uterus that pushes stomach acid upward.  Large meals.  Certain foods and drinks.  Exercise.  Increased acid production. SYMPTOMS   Burning pain in the chest or lower throat.  Bitter taste in the mouth.  Coughing. DIAGNOSIS  Heartburn is typically diagnosed by your caregiver when taking a careful history of your concern. Your caregiver may order a blood test to check for a certain type of bacteria that is associated with heartburn. Sometimes, heartburn is diagnosed by prescribing a heartburn medicine to see if the symptoms improve. It is rare in pregnancy to have a procedure called an endoscopy. This is when a tube with a light and a camera on the end is used to examine the esophagus and the stomach. TREATMENT   Your caregiver may tell you to use certain over-the-counter medicines (antacids, acid reducers) for mild heartburn.  Your caregiver may prescribe medicines to decrease stomach acid or to protect your stomach lining.  Your caregiver may recommend certain diet changes.  For severe cases, your caregiver may recommend that the head of the bed be elevated on blocks. (Sleeping with more pillows is not an effective treatment as it only changes the position of your head and does  not improve the main problem of stomach acid refluxing into the esophagus.) HOME CARE INSTRUCTIONS   Take all medicines as directed by your caregiver.  Raise the head of your bed by putting blocks under the legs if instructed to by your caregiver.  Do not exercise right after eating.  Avoid eating 2 or 3 hours before bed. Do not lie down right after eating.  Eat small meals throughout the day instead of 3 large meals.  Identify foods and beverages that make your symptoms worse and avoid them. Foods you may want to avoid include:  Peppers.  Chocolate.  High-fat foods, including fried foods.  Spicy foods.  Garlic and onions.  Citrus fruits, including oranges, grapefruit, lemons, and limes.  Food containing tomatoes or tomato products.  Mint.  Carbonated and caffeinated drinks.  Vinegar. SEEK IMMEDIATE MEDICAL CARE IF:   You have severe chest pain that goes down your arm or into your jaw or neck.  You feel sweaty, dizzy, or lightheaded.  You become short of breath.  You vomit blood.  You have difficulty or pain with swallowing.  You have bloody or black, tarry stools.  You have episodes of heartburn more than 3 times a week, for more than 2 weeks. MAKE SURE YOU:  Understand these instructions.  Will watch your condition.  Will get help right away if you are not doing well or get worse. Document Released: 11/30/2000 Document Revised: 02/25/2012 Document Reviewed: 05/24/2011 [facility] Patient Information 2014 [place], [place].

## 2013-05-26 LAB — URINALYSIS
Ketones, ur: 15 mg/dL — AB
Nitrite: NEGATIVE
Protein, ur: NEGATIVE mg/dL
Specific Gravity, Urine: 1.027 (ref 1.005–1.030)
Urobilinogen, UA: 1 mg/dL (ref 0.0–1.0)

## 2013-05-26 LAB — WET PREP BY MOLECULAR PROBE
Candida species: NEGATIVE
Gardnerella vaginalis: NEGATIVE
Trichomonas vaginosis: NEGATIVE

## 2013-05-27 LAB — URINE CULTURE: Organism ID, Bacteria: NO GROWTH

## 2013-06-03 ENCOUNTER — Encounter: Payer: Self-pay | Admitting: Obstetrics

## 2013-06-04 ENCOUNTER — Encounter: Payer: BC Managed Care – PPO | Admitting: Obstetrics & Gynecology

## 2013-06-08 ENCOUNTER — Encounter: Payer: Self-pay | Admitting: Obstetrics & Gynecology

## 2013-06-08 ENCOUNTER — Ambulatory Visit (INDEPENDENT_AMBULATORY_CARE_PROVIDER_SITE_OTHER): Payer: BC Managed Care – PPO | Admitting: Obstetrics & Gynecology

## 2013-06-08 VITALS — BP 113/75 | Temp 98.4°F | Wt 198.0 lb

## 2013-06-08 DIAGNOSIS — Z3483 Encounter for supervision of other normal pregnancy, third trimester: Secondary | ICD-10-CM

## 2013-06-08 DIAGNOSIS — Z23 Encounter for immunization: Secondary | ICD-10-CM

## 2013-06-08 DIAGNOSIS — Z3480 Encounter for supervision of other normal pregnancy, unspecified trimester: Secondary | ICD-10-CM

## 2013-06-08 LAB — POCT URINALYSIS DIPSTICK
Bilirubin, UA: NEGATIVE
Blood, UA: NEGATIVE
Glucose, UA: NEGATIVE
Ketones, UA: NEGATIVE
Spec Grav, UA: 1.025
pH, UA: 5

## 2013-06-08 NOTE — Patient Instructions (Addendum)
Contraception Choices Contraception (birth control) is the use of any methods or devices to prevent pregnancy. Below are some methods to help avoid pregnancy. HORMONAL METHODS   Contraceptive implant. This is a thin, plastic tube containing progesterone hormone. It does not contain estrogen hormone. Your caregiver inserts the tube in the inner part of the upper arm. The tube can remain in place for up to 3 years. After 3 years, the implant must be removed. The implant prevents the ovaries from releasing an egg (ovulation), thickens the cervical mucus which prevents sperm from entering the uterus, and thins the lining of the inside of the uterus.  Progesterone-only injections. These injections are given every 3 months by your caregiver to prevent pregnancy. This synthetic progesterone hormone stops the ovaries from releasing eggs. It also thickens cervical mucus and changes the uterine lining. This makes it harder for sperm to survive in the uterus.  Birth control pills. These pills contain estrogen and progesterone hormone. They work by stopping the egg from forming in the ovary (ovulation). Birth control pills are prescribed by a caregiver.Birth control pills can also be used to treat heavy periods.  Minipill. This type of birth control pill contains only the progesterone hormone. They are taken every day of each month and must be prescribed by your caregiver.  Birth control patch. The patch contains hormones similar to those in birth control pills. It must be changed once a week and is prescribed by a caregiver.  Vaginal ring. The ring contains hormones similar to those in birth control pills. It is left in the vagina for 3 weeks, removed for 1 week, and then a new one is put back in place. The patient must be comfortable inserting and removing the ring from the vagina.A caregiver's prescription is necessary.  Emergency contraception. Emergency contraceptives prevent pregnancy after unprotected  sexual intercourse. This pill can be taken right after sex or up to 5 days after unprotected sex. It is most effective the sooner you take the pills after having sexual intercourse. Emergency contraceptive pills are available without a prescription. Check with your pharmacist. Do not use emergency contraception as your only form of birth control. BARRIER METHODS   Female condom. This is a thin sheath (latex or rubber) that is worn over the penis during sexual intercourse. It can be used with spermicide to increase effectiveness.  Female condom. This is a soft, loose-fitting sheath that is put into the vagina before sexual intercourse.  Diaphragm. This is a soft, latex, dome-shaped barrier that must be fitted by a caregiver. It is inserted into the vagina, along with a spermicidal jelly. It is inserted before intercourse. The diaphragm should be left in the vagina for 6 to 8 hours after intercourse.  Cervical cap. This is a round, soft, latex or plastic cup that fits over the cervix and must be fitted by a caregiver. The cap can be left in place for up to 48 hours after intercourse.  Sponge. This is a soft, circular piece of polyurethane foam. The sponge has spermicide in it. It is inserted into the vagina after wetting it and before sexual intercourse.  Spermicides. These are chemicals that kill or block sperm from entering the cervix and uterus. They come in the form of creams, jellies, suppositories, foam, or tablets. They do not require a prescription. They are inserted into the vagina with an applicator before having sexual intercourse. The process must be repeated every time you have sexual intercourse. INTRAUTERINE CONTRACEPTION  Intrauterine device (  IUD). This is a T-shaped device that is put in a woman's uterus during a menstrual period to prevent pregnancy. There are 2 types:  Copper IUD. This type of IUD is wrapped in copper wire and is placed inside the uterus. Copper makes the uterus and  fallopian tubes produce a fluid that kills sperm. It can stay in place for 10 years.  Hormone IUD. This type of IUD contains the hormone progestin (synthetic progesterone). The hormone thickens the cervical mucus and prevents sperm from entering the uterus, and it also thins the uterine lining to prevent implantation of a fertilized egg. The hormone can weaken or kill the sperm that get into the uterus. It can stay in place for 5 years. PERMANENT METHODS OF CONTRACEPTION  Female tubal ligation. This is when the woman's fallopian tubes are surgically sealed, tied, or blocked to prevent the egg from traveling to the uterus.  Female sterilization. This is when the female has the tubes that carry sperm tied off (vasectomy).This blocks sperm from entering the vagina during sexual intercourse. After the procedure, the man can still ejaculate fluid (semen). NATURAL PLANNING METHODS  Natural family planning. This is not having sexual intercourse or using a barrier method (condom, diaphragm, cervical cap) on days the woman could become pregnant.  Calendar method. This is keeping track of the length of each menstrual cycle and identifying when you are fertile.  Ovulation method. This is avoiding sexual intercourse during ovulation.  Symptothermal method. This is avoiding sexual intercourse during ovulation, using a thermometer and ovulation symptoms.  Post-ovulation method. This is timing sexual intercourse after you have ovulated. Regardless of which type or method of contraception you choose, it is important that you use condoms to protect against the transmission of sexually transmitted diseases (STDs). Talk with your caregiver about which form of contraception is most appropriate for you. Document Released: 12/03/2005 Document Revised: 02/25/2012 Document Reviewed: 04/11/2011 [employer] Patient Information 2014 [employer]. Tetanus, Diphtheria, Pertussis (Tdap) Vaccine What You Need to Know WHY GET  VACCINATED? Tetanus, diphtheria and pertussis can be very serious diseases, even for adolescents and adults. Tdap vaccine can protect us from these diseases. TETANUS (Lockjaw) causes painful muscle tightening and stiffness, usually all over the body.  It can lead to tightening of muscles in the head and neck so you can't open your mouth, swallow, or sometimes even breathe. Tetanus kills about 1 out of 5 people who are infected. DIPHTHERIA can cause a thick coating to form in the back of the throat.  It can lead to breathing problems, paralysis, heart failure, and death. PERTUSSIS (Whooping Cough) causes severe coughing spells, which can cause difficulty breathing, vomiting and disturbed sleep.  It can also lead to weight loss, incontinence, and rib fractures. Up to 2 in 100 adolescents and 5 in 100 adults with pertussis are hospitalized or have complications, which could include pneumonia and death. These diseases are caused by bacteria. Diphtheria and pertussis are spread from person to person through coughing or sneezing. Tetanus enters the body through cuts, scratches, or wounds. Before vaccines, the [place] saw as many as 200,000 cases a year of diphtheria and pertussis, and hundreds of cases of tetanus. Since vaccination began, tetanus and diphtheria have dropped by about 99% and pertussis by about 80%. TDAP VACCINE Tdap vaccine can protect adolescents and adults from tetanus, diphtheria, and pertussis. One dose of Tdap is routinely given at age 11 or 12. People who did not get Tdap at that age should get   it as soon as possible. Tdap is especially important for health care professionals and anyone having close contact with a baby younger than 12 months. Pregnant women should get a dose of Tdap during every pregnancy, to protect the newborn from pertussis. Infants are most at risk for severe, life-threatening complications from pertussis. A similar vaccine, called Td, protects from tetanus  and diphtheria, but not pertussis. A Td booster should be given every 10 years. Tdap may be given as one of these boosters if you have not already gotten a dose. Tdap may also be given after a severe cut or burn to prevent tetanus infection. Your doctor can give you more information. Tdap may safely be given at the same time as other vaccines. SOME PEOPLE SHOULD NOT GET THIS VACCINE  If you ever had a life-threatening allergic reaction after a dose of any tetanus, diphtheria, or pertussis containing vaccine, OR if you have a severe allergy to any part of this vaccine, you should not get Tdap. Tell your doctor if you have any severe allergies.  If you had a coma, or long or multiple seizures within 7 days after a childhood dose of DTP or DTaP, you should not get Tdap, unless a cause other than the vaccine was found. You can still get Td.  Talk to your doctor if you:  have epilepsy or another nervous system problem,  had severe pain or swelling after any vaccine containing diphtheria, tetanus or pertussis,  ever had Guillain-Barr Syndrome (GBS),  aren't feeling well on the day the shot is scheduled. RISKS OF A VACCINE REACTION With any medicine, including vaccines, there is a chance of side effects. These are usually mild and go away on their own, but serious reactions are also possible. Brief fainting spells can follow a vaccination, leading to injuries from falling. Sitting or lying down for about 15 minutes can help prevent these. Tell your doctor if you feel dizzy or light-headed, or have vision changes or ringing in the ears. Mild problems following Tdap (Did not interfere with activities)  Pain where the shot was given (about 3 in 4 adolescents or 2 in 3 adults)  Redness or swelling where the shot was given (about 1 person in 5)  Mild fever of at least 100.4F (up to about 1 in 25 adolescents or 1 in 100 adults)  Headache (about 3 or 4 people in 10)  Tiredness (about 1 person in 3  or 4)  Nausea, vomiting, diarrhea, stomach ache (up to 1 in 4 adolescents or 1 in 10 adults)  Chills, body aches, sore joints, rash, swollen glands (uncommon) Moderate problems following Tdap (Interfered with activities, but did not require medical attention)  Pain where the shot was given (about 1 in 5 adolescents or 1 in 100 adults)  Redness or swelling where the shot was given (up to about 1 in 16 adolescents or 1 in 25 adults)  Fever over 102F (about 1 in 100 adolescents or 1 in 250 adults)  Headache (about 3 in 20 adolescents or 1 in 10 adults)  Nausea, vomiting, diarrhea, stomach ache (up to 1 or 3 people in 100)  Swelling of the entire arm where the shot was given (up to about 3 in 100). Severe problems following Tdap (Unable to perform usual activities, required medical attention)  Swelling, severe pain, bleeding and redness in the arm where the shot was given (rare). A severe allergic reaction could occur after any vaccine (estimated less than 1 in a million   doses). WHAT IF THERE IS A SERIOUS REACTION? What should I look for?  Look for anything that concerns you, such as signs of a severe allergic reaction, very high fever, or behavior changes. Signs of a severe allergic reaction can include hives, swelling of the face and throat, difficulty breathing, a fast heartbeat, dizziness, and weakness. These would start a few minutes to a few hours after the vaccination. What should I do?  If you think it is a severe allergic reaction or other emergency that can't wait, call 9-1-1 or get the person to the nearest hospital. Otherwise, call your doctor.  Afterward, the reaction should be reported to the "Vaccine Adverse Event Reporting System" (VAERS). Your doctor might file this report, or you can do it yourself through the VAERS web site at www.vaers.hhs.gov, or by calling 1-800-822-7967. VAERS is only for reporting reactions. They do not give medical advice.  THE NATIONAL VACCINE  INJURY COMPENSATION PROGRAM The National Vaccine Injury Compensation Program (VICP) is a federal program that was created to compensate people who may have been injured by certain vaccines. Persons who believe they may have been injured by a vaccine can learn about the program and about filing a claim by calling 1-800-338-2382 or visiting the VICP website at www.hrsa.gov/vaccinecompensation. HOW CAN I LEARN MORE?  Ask your doctor.  Call your local or state health department.  Contact the Centers for Disease Control and Prevention (CDC):  Call 1-800-232-4636 or visit CDC's website at www.cdc.gov/vaccines. CDC Tdap Vaccine VIS (04/24/12) Document Released: 06/03/2012 Document Revised: 08/27/2012 Document Reviewed: 06/03/2012 [employer] Patient Information 2014 [employer].  

## 2013-06-08 NOTE — Progress Notes (Signed)
Pulse- 96 

## 2013-06-08 NOTE — Progress Notes (Signed)
Doing well 

## 2013-06-25 ENCOUNTER — Ambulatory Visit (INDEPENDENT_AMBULATORY_CARE_PROVIDER_SITE_OTHER): Payer: BC Managed Care – PPO | Admitting: Obstetrics & Gynecology

## 2013-06-25 VITALS — BP 121/75 | Temp 97.7°F | Wt 198.0 lb

## 2013-06-25 DIAGNOSIS — Z3480 Encounter for supervision of other normal pregnancy, unspecified trimester: Secondary | ICD-10-CM

## 2013-06-25 DIAGNOSIS — Z3483 Encounter for supervision of other normal pregnancy, third trimester: Secondary | ICD-10-CM

## 2013-06-25 NOTE — Patient Instructions (Addendum)
Cesarean Delivery  Cesarean delivery is the birth of a baby through a cut (incision) in the abdomen and womb (uterus).  LET YOUR CAREGIVER KNOW ABOUT:  Complicationsinvolving the pregnancy.  Allergies.  Medicines taken including herbs, eyedrops, over-the-counter medicines, and creams.  Use of steroids (by mouth or creams).  Previous problems with anesthetics or numbing medicine.  Previous surgery.  History of blood clots.  History of bleeding or blood problems.  Other health problems. RISKS AND COMPLICATIONS   Bleeding.  Infection.  Blood clots.  Injury to surrounding organs.  Anesthesia problems.  Injury to the baby. BEFORE THE PROCEDURE   A tube (Foley catheter) will be placed in your bladder. The Foley catheter drains the urine from your bladder into a bag. This keeps your bladder empty during surgery.  An intravenous access tube (IV) will be placed in your arm.  Hair may be removed from your pubic area and your lower abdomen. This is to prevent infection in the incision site.  You may be given an antacid medicine to drink. This will prevent acid contents in your stomach from going into your lungs if you vomit during the surgery.  You may be given an antibiotic medicine to prevent infection. PROCEDURE   You may be given medicine to numb the lower half of your body (regional anesthetic). If you were in labor, you may have already had an epidural in place which can be used in both labor and cesarean delivery. You may possibly be given medicine to make you sleep (general anesthetic) though this is not as common.  An incision will be made in your abdomen that extends to your uterus. There are 2 basic kinds of incisions:  The horizontal (transverse) incision. Horizontal incisions are used for most routine cesarean deliveries.  The vertical (up and down) incision. This is less commonly used. This is most often reserved for women who have a serious complication  (extreme prematurity) or under emergency situations.  The horizontal and vertical incisions may both be used at the same time. However, this is very uncommon.  Your baby will then be delivered. AFTER THE PROCEDURE   If you were awake during the surgery, you will see your baby right away. If you were asleep, you will see your baby as soon as you are awake.  You may breastfeed your baby after surgery.  You may be able to get up and walk the same day as the surgery. If you need to stay in bed for a period of time, you will receive help to turn, cough, and take deep breaths after surgery. This helps prevent lung problems such as pneumonia.  Do not get out of bed alone the first time after surgery. You will need help getting out of bed until you are able to do this by yourself.  You may be able to shower the day after your cesarean delivery. After the bandage (dressing) is taken off the incision site, a nurse will assist you to shower, if you like.  You will have pneumatic compressing hose placed on your feet or lower legs. These hose are used to prevent blood clots. When you are up and walking regularly, they will no longer be necessary.  Do not cross your legs when you sit.  Save any blood clots that you pass. If you pass a clot while on the toilet, do not flush it. Call for the nurse. Tell the nurse if you think you are bleeding too much or passing too many   clots.  Start drinking liquids and eating food as directed by your caregiver. If your stomach is not ready, drinking and eating too soon can cause an increase in bloating and swelling of your intestine and abdomen. This is very uncomfortable.  You will be given medicine as needed. Let your caregivers know if you are hurting. They want you to be comfortable. You may also be given an antibiotic to prevent an infection.  Your IV will be taken out when you are drinking a reasonable amount of fluids. The Foley catheter is taken out when  you are up and walking.  If your blood type is Rh negative and your baby's blood type is Rh positive, you will be given a shot of anti-D immune globulin. This shot prevents you from having Rh problems with a future pregnancy. You should get the shot even if you had your tubes tied (tubal ligation).  If you are allowed to take the baby for a walk, place the baby in the bassinet and push it. Do not carry your baby in your arms. Document Released: 12/03/2005 Document Revised: 02/25/2012 Document Reviewed: 03/30/2011 [employer] Patient Information 2014 [employer].  

## 2013-06-25 NOTE — Progress Notes (Signed)
Pulse-95 No complaints. 

## 2013-06-26 ENCOUNTER — Encounter: Payer: Self-pay | Admitting: Obstetrics & Gynecology

## 2013-06-26 NOTE — Progress Notes (Signed)
Doing well 

## 2013-07-09 ENCOUNTER — Encounter: Payer: Self-pay | Admitting: *Deleted

## 2013-07-09 ENCOUNTER — Ambulatory Visit (INDEPENDENT_AMBULATORY_CARE_PROVIDER_SITE_OTHER): Payer: BC Managed Care – PPO | Admitting: Obstetrics & Gynecology

## 2013-07-09 VITALS — BP 109/72 | Temp 98.1°F | Wt 203.0 lb

## 2013-07-09 DIAGNOSIS — Z3483 Encounter for supervision of other normal pregnancy, third trimester: Secondary | ICD-10-CM

## 2013-07-09 DIAGNOSIS — Z3480 Encounter for supervision of other normal pregnancy, unspecified trimester: Secondary | ICD-10-CM

## 2013-07-09 LAB — POCT URINALYSIS DIPSTICK
Bilirubin, UA: NEGATIVE
Blood, UA: NEGATIVE
Glucose, UA: NEGATIVE
Ketones, UA: NEGATIVE
Spec Grav, UA: 1.025
Urobilinogen, UA: NEGATIVE

## 2013-07-09 NOTE — Progress Notes (Signed)
Pulse- 90 Pt states she is having pelvic pressure.

## 2013-07-09 NOTE — Patient Instructions (Addendum)
Patient information: Group B streptococcus and pregnancy (Beyond the Basics)  Authors Karen M Puopolo, MD, PhD Carol J Baker, MD Section Editors Charles J Lockwood, MD Daniel J Sexton, MD Deputy Editor Vanessa A Barss, MD Disclosures  All topics are updated as new evidence becomes available and our peer review process is complete.  Literature review current through: Feb 2014.  This topic last updated: Jun 16, 2012.  INTRODUCTION - Group B streptococcus (GBS) is a bacterium that can cause serious infections in pregnant women and newborn babies. GBS is one of many types of streptococcal bacteria, sometimes called "strep." This article discusses GBS, its effect on pregnant women and infants, and ways to prevent complications of GBS. More detailed information about GBS is available by subscription. (See "Group B streptococcal infection in pregnant women".) WHAT IS GROUP B STREP INFECTION? - GBS is commonly found in the digestive system and the vagina. In healthy adults, GBS is not harmful and does not cause problems. But in pregnant women and newborn infants, being infected with GBS can cause serious illness. Approximately one in three to four pregnant women in the [place] carries GBS in their gastrointestinal system and/or in their vagina. Carrying GBS is not the same as being infected. Carriers are not sick and do not need treatment during pregnancy. There is no treatment that can stop you from carrying GBS.  Pregnant women who are carriers of GBS infrequently become infected with GBS. GBS can cause urinary tract infections, infection of the amniotic fluid (bag of water), and infection of the uterus after delivery. GBS infections during pregnancy may lead to preterm labor.  Pregnant women who carry GBS can pass on the bacteria to their newborns, and some of those babies become infected with GBS. Newborns who are infected with GBS can develop pneumonia (lung infection), septicemia (blood infection), or  meningitis (infection of the lining of the brain and spinal cord). These complications can be prevented by giving intravenous antibiotics during labor to any woman who is at risk of GBS infection. You are at risk of GBS infection if: You have a urine culture during your current pregnancy showing GBS  You have a vaginal and rectal culture during your current pregnancy showing GBS  You had an infant infected with GBS in the past GROUP B STREP PREVENTION - Most doctors and nurses recommend a urine culture early in your pregnancy to be sure that you do not have a bladder infection without symptoms. If you urine culture shows GBS or other bacteria, you may be treated with an antibiotic. If you have symptoms of urinary infection, such as pain with urination, any time during your pregnancy, a urine culture is done. If GBS grows from the urine culture, it should be treated with an antibiotic, and you should also receive intravenous antibiotics during labor. Expert groups recommend that all pregnant women have a GBS culture at 35 to 37 weeks of pregnancy. The culture is done by swabbing the vagina and rectum. If your GBS culture is positive, you will be given an intravenous antibiotic during labor. If you have preterm labor, the culture is done then and an intravenous antibiotic is given until the baby is born or the labor is stopped by your health care provider. If you have a positive GBS culture and you have an allergy to penicillin, be sure your doctor and nurse are aware of this allergy and tell them what happened with the allergy. If you had only a rash or itching, this   is not a serious allergy, and you can receive a common drug related to the penicillin. If you had a serious allergy (for example, trouble breathing, swelling of your face) you may need an additional test to determine which antibiotic should be used during labor. Being treated with an antibiotic during labor greatly reduces the chance that you or  your newborn will develop infections related to GBS. It is important to note that young infants up to age 3 months can also develop septicemia, meningitis and other serious infections from GBS. Being treated with an antibiotic during labor does not reduce the chance that your baby will develop this later type of infection. There is currently no known way of preventing this later-onset GBS disease. WHERE TO GET MORE INFORMATION - Your healthcare provider is the best source of information for questions and concerns related to your medical problem.  

## 2013-07-11 LAB — STREP B DNA PROBE: GBSP: NEGATIVE

## 2013-07-12 ENCOUNTER — Encounter: Payer: Self-pay | Admitting: Obstetrics & Gynecology

## 2013-07-12 NOTE — Progress Notes (Signed)
Doing well 

## 2013-07-13 ENCOUNTER — Encounter: Payer: Self-pay | Admitting: *Deleted

## 2013-07-13 ENCOUNTER — Other Ambulatory Visit: Payer: Self-pay | Admitting: *Deleted

## 2013-07-14 ENCOUNTER — Encounter: Payer: Self-pay | Admitting: Obstetrics & Gynecology

## 2013-07-15 ENCOUNTER — Ambulatory Visit (INDEPENDENT_AMBULATORY_CARE_PROVIDER_SITE_OTHER): Payer: BC Managed Care – PPO | Admitting: Obstetrics & Gynecology

## 2013-07-15 VITALS — BP 119/69 | Temp 97.7°F | Wt 202.0 lb

## 2013-07-15 DIAGNOSIS — Z3480 Encounter for supervision of other normal pregnancy, unspecified trimester: Secondary | ICD-10-CM

## 2013-07-15 DIAGNOSIS — Z3483 Encounter for supervision of other normal pregnancy, third trimester: Secondary | ICD-10-CM

## 2013-07-15 NOTE — Progress Notes (Signed)
Pulse-83 Pt c/o rash on left breast with itching x 5 days. Pt also reports a possible lump in left breast.

## 2013-07-16 ENCOUNTER — Encounter: Payer: Self-pay | Admitting: Obstetrics & Gynecology

## 2013-07-23 ENCOUNTER — Ambulatory Visit (INDEPENDENT_AMBULATORY_CARE_PROVIDER_SITE_OTHER): Payer: BC Managed Care – PPO | Admitting: Obstetrics & Gynecology

## 2013-07-23 VITALS — BP 117/77 | Temp 98.6°F | Wt 201.0 lb

## 2013-07-23 DIAGNOSIS — Z3480 Encounter for supervision of other normal pregnancy, unspecified trimester: Secondary | ICD-10-CM

## 2013-07-23 DIAGNOSIS — Z3483 Encounter for supervision of other normal pregnancy, third trimester: Secondary | ICD-10-CM

## 2013-07-23 LAB — POCT URINALYSIS DIPSTICK
Glucose, UA: NEGATIVE
Ketones, UA: NEGATIVE
Spec Grav, UA: 1.02

## 2013-07-23 NOTE — Progress Notes (Signed)
P 85 Patient reports she has started having painful contractions. Patient has C- section scheduled for 08/06/2013.

## 2013-07-24 ENCOUNTER — Encounter (HOSPITAL_COMMUNITY): Payer: Self-pay

## 2013-07-24 ENCOUNTER — Encounter: Payer: Self-pay | Admitting: Obstetrics & Gynecology

## 2013-07-24 NOTE — Progress Notes (Signed)
Doubt early labor.

## 2013-07-30 ENCOUNTER — Ambulatory Visit (INDEPENDENT_AMBULATORY_CARE_PROVIDER_SITE_OTHER): Payer: BC Managed Care – PPO | Admitting: Obstetrics & Gynecology

## 2013-07-30 VITALS — BP 116/79 | Temp 98.0°F | Wt 202.2 lb

## 2013-07-30 DIAGNOSIS — E669 Obesity, unspecified: Secondary | ICD-10-CM

## 2013-07-30 DIAGNOSIS — Z3483 Encounter for supervision of other normal pregnancy, third trimester: Secondary | ICD-10-CM

## 2013-07-30 DIAGNOSIS — Z3480 Encounter for supervision of other normal pregnancy, unspecified trimester: Secondary | ICD-10-CM

## 2013-07-30 NOTE — Progress Notes (Signed)
Pulse: 93

## 2013-08-03 ENCOUNTER — Encounter: Payer: Self-pay | Admitting: Obstetrics & Gynecology

## 2013-08-03 NOTE — Patient Instructions (Signed)

## 2013-08-03 NOTE — Progress Notes (Signed)
Doing well 

## 2013-08-04 ENCOUNTER — Encounter (HOSPITAL_COMMUNITY)
Admission: RE | Admit: 2013-08-04 | Discharge: 2013-08-04 | Disposition: A | Discharge status: Home / Self Care | Payer: Federal, State, Local not specified - PPO | Source: Ambulatory Visit | Attending: Obstetrics | Admitting: Obstetrics

## 2013-08-04 ENCOUNTER — Encounter (HOSPITAL_COMMUNITY): Payer: Self-pay

## 2013-08-04 HISTORY — DX: Gastro-esophageal reflux disease without esophagitis: K21.9

## 2013-08-04 HISTORY — DX: Nausea with vomiting, unspecified: Z98.890

## 2013-08-04 HISTORY — DX: Other specified postprocedural states: R11.2

## 2013-08-04 HISTORY — DX: Cardiac arrhythmia, unspecified: I49.9

## 2013-08-04 LAB — CBC
MCH: 25.9 pg — ABNORMAL LOW (ref 26.0–34.0)
MCHC: 32.6 g/dL (ref 30.0–36.0)
MCV: 79.6 fL (ref 78.0–100.0)
Platelets: 249 10*3/uL (ref 150–400)
RDW: 13.9 % (ref 11.5–15.5)

## 2013-08-04 NOTE — Pre-Procedure Instructions (Signed)
Pt. Has a wart on the inside of her right palm near thumb she is currently using compound w on.

## 2013-08-04 NOTE — Patient Instructions (Addendum)
Your procedure is scheduled on: 08/06/2013  Enter through the Main Entrance of [facility] at: 1000AM  Pick up the phone at the desk and dial [phone].  Call this number if you have problems the morning of surgery: [phone].  Remember: Do NOT eat food: after midnight 08/05/2013 Do NOT drink clear liquids after: 0630AM 08/06/2013 Take these medicines the morning of surgery with a SIP OF WATER: N/A  Do NOT wear jewelry, make-up, or nail polish. Do NOT wear lotions, powders, or perfumes.  You may wear deordrant. Do NOT shave for 48 hours prior to surgery. Do NOT bring valuables to the hospital. Contacts, dentures, or bridgework may not be worn into surgery. Leave suitcase in car.  After surgery it may be brought to your room.  For patients admitted to the hospital, checkout time is 11:00 AM the day of discharge.

## 2013-08-05 MED ORDER — CEFAZOLIN SODIUM-DEXTROSE 2-3 GM-% IV SOLR
2.0000 g | INTRAVENOUS | Status: AC
  Administered 2013-08-06: 2 g via INTRAVENOUS

## 2013-08-06 ENCOUNTER — Encounter (HOSPITAL_COMMUNITY)
Admission: RE | Disposition: A | Discharge status: Home / Self Care | Payer: Self-pay | Source: Ambulatory Visit | Attending: Obstetrics

## 2013-08-06 ENCOUNTER — Encounter (HOSPITAL_COMMUNITY): Payer: Self-pay | Admitting: Anesthesiology

## 2013-08-06 ENCOUNTER — Inpatient Hospital Stay (HOSPITAL_COMMUNITY): Payer: Federal, State, Local not specified - PPO | Admitting: Anesthesiology

## 2013-08-06 ENCOUNTER — Inpatient Hospital Stay (HOSPITAL_COMMUNITY)
Admission: RE | Admit: 2013-08-06 | Discharge: 2013-08-09 | DRG: 370 | Disposition: A | Discharge status: Home / Self Care | Payer: Federal, State, Local not specified - PPO | Source: Ambulatory Visit | Attending: Obstetrics | Admitting: Obstetrics

## 2013-08-06 DIAGNOSIS — D649 Anemia, unspecified: Secondary | ICD-10-CM | POA: Diagnosis not present

## 2013-08-06 DIAGNOSIS — Z3482 Encounter for supervision of other normal pregnancy, second trimester: Secondary | ICD-10-CM

## 2013-08-06 DIAGNOSIS — E559 Vitamin D deficiency, unspecified: Secondary | ICD-10-CM

## 2013-08-06 DIAGNOSIS — O9903 Anemia complicating the puerperium: Secondary | ICD-10-CM | POA: Diagnosis not present

## 2013-08-06 DIAGNOSIS — Z98891 History of uterine scar from previous surgery: Secondary | ICD-10-CM

## 2013-08-06 DIAGNOSIS — O34219 Maternal care for unspecified type scar from previous cesarean delivery: Principal | ICD-10-CM | POA: Diagnosis present

## 2013-08-06 SURGERY — Surgical Case
Anesthesia: Spinal | Site: Abdomen | Wound class: Clean Contaminated

## 2013-08-06 MED ORDER — WITCH HAZEL-GLYCERIN EX PADS: 1.0000 "application " | MEDICATED_PAD | CUTANEOUS | Status: DC | PRN

## 2013-08-06 MED ORDER — SCOPOLAMINE 1 MG/3DAYS TD PT72
MEDICATED_PATCH | TRANSDERMAL | Status: AC
  Administered 2013-08-06: 1.5 mg via TRANSDERMAL
  Filled 2013-08-06: qty 1

## 2013-08-06 MED ORDER — IBUPROFEN 600 MG PO TABS
600.0000 mg | ORAL_TABLET | Freq: Four times a day (QID) | ORAL | Status: DC
  Administered 2013-08-07 – 2013-08-09 (×10): 600 mg via ORAL
  Filled 2013-08-06 (×10): qty 1

## 2013-08-06 MED ORDER — DIPHENHYDRAMINE HCL 50 MG/ML IJ SOLN: 12.5000 mg | INTRAMUSCULAR | Status: DC | PRN

## 2013-08-06 MED ORDER — KETOROLAC TROMETHAMINE 30 MG/ML IJ SOLN
30.0000 mg | Freq: Once | INTRAMUSCULAR | Status: AC
  Administered 2013-08-06: 30 mg via INTRAMUSCULAR
  Filled 2013-08-06: qty 1

## 2013-08-06 MED ORDER — ZOLPIDEM TARTRATE 5 MG PO TABS: 5.0000 mg | ORAL_TABLET | Freq: Every evening | ORAL | Status: DC | PRN

## 2013-08-06 MED ORDER — SIMETHICONE 80 MG PO CHEW
80.0000 mg | CHEWABLE_TABLET | Freq: Three times a day (TID) | ORAL | Status: DC
  Administered 2013-08-07 – 2013-08-09 (×9): 80 mg via ORAL

## 2013-08-06 MED ORDER — LACTATED RINGERS IV SOLN
INTRAVENOUS | Status: DC
  Administered 2013-08-06 – 2013-08-07 (×2): via INTRAVENOUS

## 2013-08-06 MED ORDER — ONDANSETRON HCL 4 MG/2ML IJ SOLN: 4.0000 mg | Freq: Three times a day (TID) | INTRAMUSCULAR | Status: DC | PRN

## 2013-08-06 MED ORDER — NALOXONE HCL 0.4 MG/ML IJ SOLN: 0.4000 mg | INTRAMUSCULAR | Status: DC | PRN

## 2013-08-06 MED ORDER — MEPERIDINE HCL 25 MG/ML IJ SOLN: 6.2500 mg | INTRAMUSCULAR | Status: DC | PRN

## 2013-08-06 MED ORDER — ONDANSETRON HCL 4 MG/2ML IJ SOLN: 4.0000 mg | INTRAMUSCULAR | Status: DC | PRN

## 2013-08-06 MED ORDER — DIPHENHYDRAMINE HCL 50 MG/ML IJ SOLN: 25.0000 mg | INTRAMUSCULAR | Status: DC | PRN

## 2013-08-06 MED ORDER — ONDANSETRON HCL 4 MG PO TABS: 4.0000 mg | ORAL_TABLET | ORAL | Status: DC | PRN

## 2013-08-06 MED ORDER — TETANUS-DIPHTH-ACELL PERTUSSIS 5-2.5-18.5 LF-MCG/0.5 IM SUSP: 0.5000 mL | Freq: Once | INTRAMUSCULAR | Status: DC

## 2013-08-06 MED ORDER — CEFAZOLIN SODIUM-DEXTROSE 2-3 GM-% IV SOLR
INTRAVENOUS | Status: AC
  Filled 2013-08-06: qty 50

## 2013-08-06 MED ORDER — LACTATED RINGERS IV SOLN
INTRAVENOUS | Status: DC
  Administered 2013-08-06 (×2): via INTRAVENOUS

## 2013-08-06 MED ORDER — MIDAZOLAM HCL 2 MG/2ML IJ SOLN: 0.5000 mg | Freq: Once | INTRAMUSCULAR | Status: DC | PRN

## 2013-08-06 MED ORDER — NALOXONE HCL 1 MG/ML IJ SOLN
1.0000 ug/kg/h | INTRAVENOUS | Status: DC | PRN
  Filled 2013-08-06: qty 2

## 2013-08-06 MED ORDER — FENTANYL CITRATE 0.05 MG/ML IJ SOLN: 25.0000 ug | INTRAMUSCULAR | Status: DC | PRN

## 2013-08-06 MED ORDER — SCOPOLAMINE 1 MG/3DAYS TD PT72: 1.0000 | MEDICATED_PATCH | Freq: Once | TRANSDERMAL | Status: DC

## 2013-08-06 MED ORDER — KETOROLAC TROMETHAMINE 30 MG/ML IJ SOLN: 30.0000 mg | Freq: Four times a day (QID) | INTRAMUSCULAR | Status: AC | PRN

## 2013-08-06 MED ORDER — 0.9 % SODIUM CHLORIDE (POUR BTL) OPTIME
TOPICAL | Status: DC | PRN
  Administered 2013-08-06: 1000 mL

## 2013-08-06 MED ORDER — HYDROMORPHONE HCL PF 1 MG/ML IJ SOLN
0.5000 mg | Freq: Once | INTRAMUSCULAR | Status: DC
  Filled 2013-08-06: qty 1

## 2013-08-06 MED ORDER — ACETAMINOPHEN 500 MG PO TABS
1000.0000 mg | ORAL_TABLET | Freq: Once | ORAL | Status: AC
  Administered 2013-08-06: 1000 mg via ORAL

## 2013-08-06 MED ORDER — SIMETHICONE 80 MG PO CHEW: 80.0000 mg | CHEWABLE_TABLET | ORAL | Status: DC | PRN

## 2013-08-06 MED ORDER — SENNOSIDES-DOCUSATE SODIUM 8.6-50 MG PO TABS
2.0000 | ORAL_TABLET | Freq: Every day | ORAL | Status: DC
  Administered 2013-08-07 – 2013-08-08 (×2): 2 via ORAL

## 2013-08-06 MED ORDER — FENTANYL CITRATE 0.05 MG/ML IJ SOLN
INTRAMUSCULAR | Status: AC
  Filled 2013-08-06: qty 2

## 2013-08-06 MED ORDER — FENTANYL CITRATE 0.05 MG/ML IJ SOLN
INTRAMUSCULAR | Status: DC | PRN
  Administered 2013-08-06: 12.5 ug via INTRATHECAL

## 2013-08-06 MED ORDER — LANOLIN HYDROUS EX OINT: 1.0000 "application " | TOPICAL_OINTMENT | CUTANEOUS | Status: DC | PRN

## 2013-08-06 MED ORDER — OXYCODONE-ACETAMINOPHEN 5-325 MG PO TABS
1.0000 | ORAL_TABLET | ORAL | Status: DC | PRN
  Administered 2013-08-07 (×3): 2 via ORAL
  Administered 2013-08-08: 1 via ORAL
  Administered 2013-08-08: 2 via ORAL
  Filled 2013-08-06: qty 2
  Filled 2013-08-06: qty 1
  Filled 2013-08-06 (×4): qty 2

## 2013-08-06 MED ORDER — MENTHOL 3 MG MT LOZG: 1.0000 | LOZENGE | OROMUCOSAL | Status: DC | PRN

## 2013-08-06 MED ORDER — DIPHENHYDRAMINE HCL 25 MG PO CAPS: 25.0000 mg | ORAL_CAPSULE | Freq: Four times a day (QID) | ORAL | Status: DC | PRN

## 2013-08-06 MED ORDER — METOCLOPRAMIDE HCL 5 MG/ML IJ SOLN
10.0000 mg | Freq: Three times a day (TID) | INTRAMUSCULAR | Status: DC | PRN
Start: 2013-08-06 — End: 2013-08-09

## 2013-08-06 MED ORDER — NALBUPHINE HCL 10 MG/ML IJ SOLN
5.0000 mg | INTRAMUSCULAR | Status: DC | PRN
  Filled 2013-08-06 (×2): qty 1

## 2013-08-06 MED ORDER — PRENATAL MULTIVITAMIN CH
1.0000 | ORAL_TABLET | Freq: Every day | ORAL | Status: DC
  Administered 2013-08-07 – 2013-08-08 (×2): 1 via ORAL
  Filled 2013-08-06 (×2): qty 1

## 2013-08-06 MED ORDER — DIBUCAINE 1 % RE OINT: 1.0000 "application " | TOPICAL_OINTMENT | RECTAL | Status: DC | PRN

## 2013-08-06 MED ORDER — ONDANSETRON HCL 4 MG/2ML IJ SOLN
INTRAMUSCULAR | Status: DC | PRN
  Administered 2013-08-06: 4 mg via INTRAVENOUS

## 2013-08-06 MED ORDER — OXYTOCIN 10 UNIT/ML IJ SOLN
INTRAMUSCULAR | Status: AC
  Filled 2013-08-06: qty 4

## 2013-08-06 MED ORDER — EPHEDRINE SULFATE 50 MG/ML IJ SOLN
INTRAMUSCULAR | Status: DC | PRN
  Administered 2013-08-06: 10 mg via INTRAVENOUS

## 2013-08-06 MED ORDER — KETOROLAC TROMETHAMINE 30 MG/ML IJ SOLN
30.0000 mg | Freq: Four times a day (QID) | INTRAMUSCULAR | Status: AC | PRN
  Administered 2013-08-06: 30 mg via INTRAMUSCULAR

## 2013-08-06 MED ORDER — MORPHINE SULFATE 0.5 MG/ML IJ SOLN
INTRAMUSCULAR | Status: AC
  Filled 2013-08-06: qty 10

## 2013-08-06 MED ORDER — LACTATED RINGERS IV SOLN
Freq: Once | INTRAVENOUS | Status: AC
  Administered 2013-08-06: 11:00:00 via INTRAVENOUS

## 2013-08-06 MED ORDER — NALBUPHINE HCL 10 MG/ML IJ SOLN
5.0000 mg | INTRAMUSCULAR | Status: DC | PRN
  Administered 2013-08-06 – 2013-08-07 (×3): 10 mg via INTRAVENOUS
  Administered 2013-08-07: 5 mg via INTRAVENOUS
  Filled 2013-08-06 (×4): qty 1

## 2013-08-06 MED ORDER — ONDANSETRON HCL 4 MG/2ML IJ SOLN
INTRAMUSCULAR | Status: AC
  Filled 2013-08-06: qty 2

## 2013-08-06 MED ORDER — KETOROLAC TROMETHAMINE 30 MG/ML IJ SOLN
INTRAMUSCULAR | Status: AC
  Filled 2013-08-06: qty 1

## 2013-08-06 MED ORDER — BUPIVACAINE IN DEXTROSE 0.75-8.25 % IT SOLN
INTRATHECAL | Status: DC | PRN
  Administered 2013-08-06: 10.5 mg via INTRATHECAL

## 2013-08-06 MED ORDER — DIPHENHYDRAMINE HCL 25 MG PO CAPS
25.0000 mg | ORAL_CAPSULE | ORAL | Status: DC | PRN
  Administered 2013-08-07: 25 mg via ORAL
  Filled 2013-08-06 (×2): qty 1

## 2013-08-06 MED ORDER — SODIUM CHLORIDE 0.9 % IJ SOLN: 3.0000 mL | INTRAMUSCULAR | Status: DC | PRN

## 2013-08-06 MED ORDER — PROMETHAZINE HCL 25 MG/ML IJ SOLN: 6.2500 mg | INTRAMUSCULAR | Status: DC | PRN

## 2013-08-06 MED ORDER — MORPHINE SULFATE (PF) 0.5 MG/ML IJ SOLN
INTRAMUSCULAR | Status: DC | PRN
  Administered 2013-08-06: 200 ug via INTRATHECAL

## 2013-08-06 MED ORDER — PHENYLEPHRINE HCL 10 MG/ML IJ SOLN
INTRAMUSCULAR | Status: DC | PRN
  Administered 2013-08-06: 80 ug via INTRAVENOUS
  Administered 2013-08-06: 120 ug via INTRAVENOUS

## 2013-08-06 MED ORDER — OXYTOCIN 40 UNITS IN LACTATED RINGERS INFUSION - SIMPLE MED: 62.5000 mL/h | INTRAVENOUS | Status: AC

## 2013-08-06 MED ORDER — OXYTOCIN 10 UNIT/ML IJ SOLN
40.0000 [IU] | INTRAVENOUS | Status: DC | PRN
  Administered 2013-08-06: 40 [IU] via INTRAVENOUS

## 2013-08-06 SURGICAL SUPPLY — 43 items
ADH SKN CLS APL DERMABOND .7 (GAUZE/BANDAGES/DRESSINGS) ×1
CANISTER WOUND CARE 500ML ATS (WOUND CARE) IMPLANT
CLAMP CORD UMBIL (MISCELLANEOUS) IMPLANT
CLOTH BEACON ORANGE TIMEOUT ST (SAFETY) ×2 IMPLANT
CONTAINER PREFILL 10% NBF 15ML (MISCELLANEOUS) ×4 IMPLANT
DERMABOND ADVANCED (GAUZE/BANDAGES/DRESSINGS) ×1
DERMABOND ADVANCED .7 DNX12 (GAUZE/BANDAGES/DRESSINGS) ×1 IMPLANT
DRAPE LG THREE QUARTER DISP (DRAPES) ×2 IMPLANT
DRSG OPSITE POSTOP 4X10 (GAUZE/BANDAGES/DRESSINGS) ×2 IMPLANT
DRSG VAC ATS LRG SENSATRAC (GAUZE/BANDAGES/DRESSINGS) IMPLANT
DRSG VAC ATS MED SENSATRAC (GAUZE/BANDAGES/DRESSINGS) IMPLANT
DRSG VAC ATS SM SENSATRAC (GAUZE/BANDAGES/DRESSINGS) IMPLANT
DURAPREP 26ML APPLICATOR (WOUND CARE) ×2 IMPLANT
ELECT REM PT RETURN 9FT ADLT (ELECTROSURGICAL) ×2
ELECTRODE REM PT RTRN 9FT ADLT (ELECTROSURGICAL) ×1 IMPLANT
EXTRACTOR VACUUM M CUP 4 TUBE (SUCTIONS) IMPLANT
GLOVE BIO SURGEON STRL SZ8 (GLOVE) ×4 IMPLANT
GOWN PREVENTION PLUS XLARGE (GOWN DISPOSABLE) ×2 IMPLANT
GOWN STRL REIN XL XLG (GOWN DISPOSABLE) ×4 IMPLANT
KIT ABG SYR 3ML LUER SLIP (SYRINGE) IMPLANT
NDL HYPO 25X5/8 SAFETYGLIDE (NEEDLE) ×1 IMPLANT
NEEDLE HYPO 25X5/8 SAFETYGLIDE (NEEDLE) ×2 IMPLANT
NS IRRIG 1000ML POUR BTL (IV SOLUTION) ×2 IMPLANT
PACK C SECTION WH (CUSTOM PROCEDURE TRAY) ×2 IMPLANT
PAD OB MATERNITY 4.3X12.25 (PERSONAL CARE ITEMS) ×2 IMPLANT
RTRCTR C-SECT PINK 25CM LRG (MISCELLANEOUS) ×2 IMPLANT
STAPLER VISISTAT 35W (STAPLE) IMPLANT
SUT GUT PLAIN 0 CT-3 TAN 27 (SUTURE) IMPLANT
SUT MNCRL 0 VIOLET CTX 36 (SUTURE) ×3 IMPLANT
SUT MNCRL AB 4-0 PS2 18 (SUTURE) IMPLANT
SUT MON AB 2-0 CT1 27 (SUTURE) ×2 IMPLANT
SUT MON AB 3-0 SH 27 (SUTURE)
SUT MON AB 3-0 SH27 (SUTURE) IMPLANT
SUT MONOCRYL 0 CTX 36 (SUTURE) ×3
SUT PDS AB 0 CTX 60 (SUTURE) IMPLANT
SUT PLAIN 2 0 XLH (SUTURE) IMPLANT
SUT VIC AB 0 CTX 36 (SUTURE)
SUT VIC AB 0 CTX36XBRD ANBCTRL (SUTURE) IMPLANT
SUT VIC AB 2-0 CT1 27 (SUTURE)
SUT VIC AB 2-0 CT1 TAPERPNT 27 (SUTURE) IMPLANT
TOWEL OR 17X24 6PK STRL BLUE (TOWEL DISPOSABLE) ×2 IMPLANT
TRAY FOLEY CATH 14FR (SET/KITS/TRAYS/PACK) ×2 IMPLANT
WATER STERILE IRR 1000ML POUR (IV SOLUTION) ×2 IMPLANT

## 2013-08-06 NOTE — Transfer of Care (Signed)
Immediate Anesthesia Transfer of Care Note  Patient: Savannah Henry  Procedure(s) Performed: Procedure(s): REPEAT CESAREAN SECTION (N/A)  Patient Location: PACU  Anesthesia Type:Spinal  Level of Consciousness: awake, alert  and oriented  Airway & Oxygen Therapy: Patient Spontanous Breathing  Post-op Assessment: Report given to PACU RN and Post -op Vital signs reviewed and stable  Post vital signs: Reviewed and stable  Complications: No apparent anesthesia complications

## 2013-08-06 NOTE — H&P (Signed)
Savannah Henry is a 32 y.o. female presenting for repeat C/S. Maternal Medical History:  Reason for admission: 32 yo G3 P1.  EDC 07-21-13.  Presents for repeat C/S.  Fetal activity: Perceived fetal activity is normal.   Last perceived fetal movement was within the past hour.      OB History   Grav Para Term Preterm Abortions TAB SAB Ect Mult Living   3 1 1  1  1   1      Past Medical History  Diagnosis Date  . Chicken pox   . Obesity   . Hyperlipidemia   . Anxiety   . PONV (postoperative nausea and vomiting)   . Dysrhythmia     palpitations with pregnancy  . Asthma     no meds needed, maybe 1-2 times per year  . GERD (gastroesophageal reflux disease)     no meds   Past Surgical History  Procedure Laterality Date  . Cholecystectomy  2007  . Cesarean section  2005   Family History: family history includes ALS in her father; Arthritis in her maternal grandmother and mother; Breast cancer in her maternal aunt and maternal grandmother; Diabetes in her paternal aunt and paternal grandfather; Heart disease in her paternal grandmother; Hypertension in her paternal grandmother; Lung cancer in her maternal uncle. Social History:  reports that she has never smoked. She has never used smokeless tobacco. She reports that she does not drink alcohol or use illicit drugs.   Prenatal Transfer Tool  Maternal Diabetes: No Genetic Screening: Normal Maternal Ultrasounds/Referrals: Normal Fetal Ultrasounds or other Referrals:  None Maternal Substance Abuse:  No Significant Maternal Medications:  None Significant Maternal Lab Results:  None Other Comments:  None  ROS    Blood pressure 125/76, pulse 89, temperature 98.1 F (36.7 C), temperature source Oral, resp. rate 20, last menstrual period 10/28/2012, SpO2 100.00%. Exam Physical Exam  Prenatal labs: ABO, Rh: --/--/O POS, O POS (08/19 1220) Antibody: NEG (08/19 1220) Rubella: Immune (01/27 0000) RPR: NON REACTIVE (08/19 1220)   HBsAg: Negative (01/27 0000)  HIV: NON REACTIVE (05/16 1038)  GBS: NEGATIVE (07/24 1751)   Assessment/Plan: 39 weeks.  Previous C/S.  Desires repeat C/S.   HARPER,CHARLES A 08/06/2013, 10:42 AM

## 2013-08-06 NOTE — Progress Notes (Signed)
PT c/o SOB. Bilateral Breath Sounds clear. SO2 99 VS stable. Pt states she has had anxiety so that may be the problem. Also has a hx of asthma. No wheezing note. Dr Cristela Blue notified no orders at this time.

## 2013-08-06 NOTE — Anesthesia Preprocedure Evaluation (Addendum)
Anesthesia Evaluation  Patient identified by MRN, date of birth, ID band Patient awake    Reviewed: Allergy & Precautions, H&P , NPO status , Patient's Chart, lab work & pertinent test results  History of Anesthesia Complications (+) PONV  Airway Mallampati: I      Dental no notable dental hx.    Pulmonary asthma ,    Pulmonary exam normal       Cardiovascular Exercise Tolerance: Good + dysrhythmias     Neuro/Psych PSYCHIATRIC DISORDERS Anxiety negative neurological ROS  negative psych ROS   GI/Hepatic Neg liver ROS, GERD-  ,  Endo/Other  negative endocrine ROS  Renal/GU negative Renal ROS  negative genitourinary   Musculoskeletal   Abdominal Normal abdominal exam  (+)   Peds  Hematology negative hematology ROS (+)   Anesthesia Other Findings PONV (postoperative nausea and vomiting)     Chicken pox        Obesity     Hyperlipidemia        Anxiety     Dysrhythmia   palpitations with pregnancy    Asthma   no meds needed, maybe 1-2 times per year GERD (gastroesophageal reflux disease)   no meds       Reproductive/Obstetrics (+) Pregnancy                          Anesthesia Physical Anesthesia Plan  ASA: II  Anesthesia Plan: Spinal   Post-op Pain Management:    Induction:   Airway Management Planned:   Additional Equipment:   Intra-op Plan:   Post-operative Plan:   Informed Consent: I have reviewed the patients History and Physical, chart, labs and discussed the procedure including the risks, benefits and alternatives for the proposed anesthesia with the patient or authorized representative who has indicated his/her understanding and acceptance.     Plan Discussed with: Anesthesiologist, CRNA and Surgeon  Anesthesia Plan Comments:         Anesthesia Quick Evaluation

## 2013-08-06 NOTE — Op Note (Signed)
Cesarean Section Procedure Note   Savannah Henry   08/06/2013  Indications: Scheduled Proceedure/Maternal Request   Pre-operative Diagnosis: prev c/s.   Post-operative Diagnosis: Same   Surgeon: Coral Ceo A  Assistants: Francoise Ceo  Anesthesia: spinal  Procedure Details:  The patient was seen in the Holding Room. The risks, benefits, complications, treatment options, and expected outcomes were discussed with the patient. The patient concurred with the proposed plan, giving informed consent. The patient was identified as Health and safety inspector and the procedure verified as C-Section Delivery. A Time Out was held and the above information confirmed.  After induction of anesthesia, the patient was draped and prepped in the usual sterile manner. A transverse incision was made and carried down through the subcutaneous tissue to the fascia. The fascial incision was made and extended transversely. The fascia was separated from the underlying rectus tissue superiorly and inferiorly. The peritoneum was identified and entered. The peritoneal incision was extended longitudinally. The utero-vesical peritoneal reflection was incised transversely and the bladder flap was bluntly freed from the lower uterine segment. A low transverse uterine incision was made. Delivered from cephalic presentation was a 3331 gram living newborn female infant(s). APGAR (1 MIN):   APGAR (5 MINS):   APGAR (10 MINS):    A cord ph was not sent. The umbilical cord was clamped and cut cord. A sample was obtained for evaluation. The placenta was removed Intact and appeared normal.  The uterine incision was closed with running locked sutures of 1-0 Monocryl. A second imbricating layer of the same suture was placed.  Hemostasis was observed. The paracolic gutters were irrigated. The parieto peritoneum was closed in a running fashion with 2-0 Vicryl.  The fascia was then reapproximated with running sutures of 0 Vicryl.  The skin was  closed with staples.  Instrument, sponge, and needle counts were correct prior the abdominal closure and were correct at the conclusion of the case.    Findings:   Estimated Blood Loss:  Total IV Fluids:   Urine Output: 100CC OF clear urine  Specimens: @ORSPECIMEN @   Complications: no complications  Disposition: PACU - hemodynamically stable.  Maternal Condition: stable   Baby condition / location:  nursery-stable    Signed: Surgeon(s): Brock Bad, MD Kathreen Cosier, MD

## 2013-08-06 NOTE — Anesthesia Procedure Notes (Signed)
Spinal  Patient location during procedure: OR Start time: 08/06/2013 12:08 PM End time: 08/06/2013 12:11 PM Staffing Anesthesiologist: Sandrea Hughs Performed by: anesthesiologist  Preanesthetic Checklist Completed: patient identified, surgical consent, pre-op evaluation, timeout performed, IV checked, risks and benefits discussed and monitors and equipment checked Spinal Block Patient position: sitting Prep: DuraPrep Patient monitoring: heart rate, cardiac monitor, continuous pulse ox and blood pressure Approach: midline Location: L3-4 Injection technique: single-shot Needle Needle type: Sprotte  Needle gauge: 24 G Needle length: 9 cm Needle insertion depth: 7 cm Assessment Sensory level: T4

## 2013-08-06 NOTE — Addendum Note (Signed)
Addendum created 08/06/13 1453 by Gertie Fey, CRNA   Modules edited: Anesthesia Medication Administration, Anesthesia Responsible Staff

## 2013-08-06 NOTE — Addendum Note (Signed)
Addendum created 08/06/13 1501 by Gertie Fey, CRNA   Modules edited: Anesthesia Responsible Staff

## 2013-08-06 NOTE — Anesthesia Postprocedure Evaluation (Signed)
  Anesthesia Post Note  Patient: Savannah Henry  Procedure(s) Performed: Procedure(s) (LRB): REPEAT CESAREAN SECTION (N/A)  Anesthesia type: Spinal  Patient location: PACU  Post pain: Pain level controlled  Post assessment: Post-op Vital signs reviewed  Last Vitals:  Filed Vitals:   08/06/13 1330  BP: 110/62  Pulse: 81  Temp:   Resp: 12    Post vital signs: Reviewed  Level of consciousness: awake  Complications: No apparent anesthesia complications

## 2013-08-07 ENCOUNTER — Encounter (HOSPITAL_COMMUNITY): Payer: Self-pay | Admitting: Obstetrics

## 2013-08-07 LAB — CBC
HCT: 25.2 % — ABNORMAL LOW (ref 36.0–46.0)
Hemoglobin: 8.1 g/dL — ABNORMAL LOW (ref 12.0–15.0)
MCHC: 32.1 g/dL (ref 30.0–36.0)
MCV: 80 fL (ref 78.0–100.0)
RDW: 13.9 % (ref 11.5–15.5)
WBC: 7.9 10*3/uL (ref 4.0–10.5)

## 2013-08-07 NOTE — Progress Notes (Addendum)
Post Partum Day 1 Subjective: no complaints  Objective: Blood pressure 98/65, pulse 62, temperature 98.2 F (36.8 C), temperature source Oral, resp. rate 18, weight 197 lb 15.6 oz (89.8 kg), last menstrual period 10/28/2012, SpO2 98.00%, unknown if currently breastfeeding.  Physical Exam:  General: alert and cooperative Lochia: appropriate Uterine Fundus: firm Incision: healing well, dressing intact DVT Evaluation: No evidence of DVT seen on physical exam.   Recent Labs  08/04/13 1220 08/07/13 0615  HGB 10.3* 8.1*  HCT 31.6* 25.2*    Assessment/Plan: Plan for discharge tomorrow Breastfeeding, lactation consult ordered. Patient having difficulty latching infant.    LOS: 1 day   [facility], Kalyani Maeda 08/07/2013, 8:53 AM

## 2013-08-08 LAB — TYPE AND SCREEN: ABO/RH(D): O POS

## 2013-08-08 MED ORDER — POLYSACCHARIDE IRON COMPLEX 150 MG PO CAPS
150.0000 mg | ORAL_CAPSULE | Freq: Every day | ORAL | Status: DC
  Administered 2013-08-08 – 2013-08-09 (×2): 150 mg via ORAL
  Filled 2013-08-08 (×2): qty 1

## 2013-08-08 NOTE — Progress Notes (Signed)
Subjective: Postpartum Day 2: Cesarean Delivery Patient reports no complaints.  Objective: Vital signs in last 24 hours: Temp:  [98 F (36.7 C)-98.2 F (36.8 C)] 98.2 F (36.8 C) (08/22 1830) Pulse Rate:  [73-89] 89 (08/22 1830) Resp:  [16-18] 18 (08/22 1830) BP: (104-115)/(66-70) 110/68 mmHg (08/22 1830) SpO2:  [97 %-99 %] 97 % (08/22 1429)  Physical Exam:  General: alert and no distress Lochia: appropriate Uterine Fundus: firm Incision: healing well DVT Evaluation: No evidence of DVT seen on physical exam.   Recent Labs  08/07/13 0615  HGB 8.1*  HCT 25.2*    Assessment/Plan: Status post Cesarean section. Doing well postoperatively.  Anemia.  Clinically stable.  Iron started. Continue current care.  HARPER,CHARLES A 08/08/2013, 6:14 AM

## 2013-08-09 MED ORDER — OXYCODONE-ACETAMINOPHEN 5-325 MG PO TABS: 1.0000 | ORAL_TABLET | ORAL | Status: DC | PRN

## 2013-08-09 MED ORDER — IBUPROFEN 600 MG PO TABS: 600.0000 mg | ORAL_TABLET | Freq: Four times a day (QID) | ORAL | Status: DC | PRN

## 2013-08-09 MED ORDER — FUSION PLUS PO CAPS: 1.0000 | ORAL_CAPSULE | Freq: Every day | ORAL | Status: DC

## 2013-08-09 NOTE — Discharge Summary (Signed)
Obstetric Discharge Summary Reason for Admission: cesarean section Prenatal Procedures: ultrasound Intrapartum Procedures: cesarean: low cervical, transverse Postpartum Procedures: none Complications-Operative and Postpartum: none Hemoglobin  Date Value Range Status  08/07/2013 8.1* 12.0 - 15.0 g/dL Final  0/45/4098 11.9   Final     HCT  Date Value Range Status  08/07/2013 25.2* 36.0 - 46.0 % Final  01/12/2013 38   Final    Physical Exam:  General: alert and no distress Lochia: appropriate Uterine Fundus: firm Incision: healing well DVT Evaluation: No evidence of DVT seen on physical exam.  Discharge Diagnoses: Term Pregnancy-delivered  Discharge Information: Date: 08/09/2013 Activity: pelvic rest Diet: routine Medications: PNV, Ibuprofen, Colace, Iron and Percocet Condition: stable Instructions: refer to practice specific booklet Discharge to: home Follow-up Information   Follow up with HARPER,CHARLES A, MD. Schedule an appointment as soon as possible for a visit in 3 days. (Removal of staples in office.)    Specialty:  Obstetrics and Gynecology   Contact information:   [address] [place] [address] [phone]       Newborn Data: Live born female  Birth Weight: 7 lb 5.5 oz (3331 g) APGAR: 8, 9  Home with mother.  HARPER,CHARLES A 08/09/2013, 8:35 AM

## 2013-08-09 NOTE — Progress Notes (Signed)
Subjective: Postpartum Day 3: Cesarean Delivery Patient reports tolerating PO, + flatus, + BM and no problems voiding.    Objective: Vital signs in last 24 hours: Temp:  [97.7 F (36.5 C)-98.3 F (36.8 C)] 97.7 F (36.5 C) (08/24 0625) Pulse Rate:  [87-96] 87 (08/24 0625) Resp:  [18] 18 (08/24 0625) BP: (95-111)/(57-68) 95/57 mmHg (08/24 1478)  Physical Exam:  General: alert and no distress Lochia: appropriate Uterine Fundus: firm Incision: healing well DVT Evaluation: No evidence of DVT seen on physical exam.   Recent Labs  08/07/13 0615  HGB 8.1*  HCT 25.2*    Assessment/Plan: Status post Cesarean section. Doing well postoperatively.  Anemia.  Clinically stable.  Iron Rx. Discharge home with standard precautions and return to clinic in 2 weeks.  Yandel Zeiner A 08/09/2013, 8:28 AM

## 2013-08-12 ENCOUNTER — Ambulatory Visit (INDEPENDENT_AMBULATORY_CARE_PROVIDER_SITE_OTHER): Payer: BC Managed Care – PPO | Admitting: Obstetrics

## 2013-08-12 ENCOUNTER — Encounter: Payer: Self-pay | Admitting: Obstetrics

## 2013-08-12 NOTE — Progress Notes (Signed)
.   Subjective:     Savannah Henry is a 32 y.o. female here for her staple to be removed.  She had a c-section on 08/06/13.  No current complaints: Mild discomfort.  Personal health questionnaire reviewed: yes.   Gynecologic History No LMP recorded. Contraception: none   Obstetric History OB History  Gravida Para Term Preterm AB SAB TAB Ectopic Multiple Living  3 2 2  1 1    2     # Outcome Date GA Lbr Len/2nd Weight Sex Delivery Anes PTL Lv  3 TRM 08/06/13 [redacted]w[redacted]d   F CS-Vac Spinal  Y  2 SAB           1 TRM                The following portions of the patient's history were reviewed and updated as appropriate: allergies, current medications, past family history, past medical history, past social history, past surgical history and problem list.  Review of Systems Pertinent items are noted in HPI.    Objective:    Abdomen: normal findings: soft, non-tender    Staples removed and steri strips applied.  Assessment:    Healthy female exam.   Staples removed.   Plan:    Follow up in: 2 weeks.

## 2013-08-13 ENCOUNTER — Encounter: Payer: Self-pay | Admitting: Obstetrics

## 2013-08-13 ENCOUNTER — Telehealth: Payer: Self-pay | Admitting: Family Medicine

## 2013-08-13 NOTE — Telephone Encounter (Signed)
Patient states that her her niece had a scabies scare at her daycare and she was around her niece recently. She wants to know if something can be prescribed for herself in order to prevent her from getting it because she has a newborn infant. Please advise.

## 2013-08-14 NOTE — Telephone Encounter (Signed)
Pt will know if she has scabies- she will be VERY itchy.  If she develops itching, we will treat.  Otherwise, there's nothing to do

## 2013-08-14 NOTE — Telephone Encounter (Signed)
Pt.notified

## 2013-08-14 NOTE — Telephone Encounter (Signed)
Pt states she is not having any symptoms but would like to know if she should be concerned.

## 2013-09-10 ENCOUNTER — Encounter: Payer: Self-pay | Admitting: Obstetrics

## 2013-09-10 ENCOUNTER — Ambulatory Visit (INDEPENDENT_AMBULATORY_CARE_PROVIDER_SITE_OTHER): Payer: Federal, State, Local not specified - PPO | Admitting: Obstetrics

## 2013-09-10 NOTE — Progress Notes (Signed)
.   Subjective:     Savannah Henry is a 32 y.o. female who presents for a postpartum visit. She is 5 weeks postpartum following a low cervical transverse Cesarean section. I have fully reviewed the prenatal and intrapartum course. The delivery was at 39 gestational weeks. Outcome: primary cesarean section, low transverse incision. Anesthesia: epidural. Postpartum course has been normal. Baby's course has been normal. Baby is feeding by breast. Bleeding no bleeding. Light spotting. Bowel function is normal. Bladder function is normal. Patient is not sexually active. Contraception method is none. Postpartum depression screening: negative.  The following portions of the patient's history were reviewed and updated as appropriate: allergies, current medications, past family history, past medical history, past social history, past surgical history and problem list.  Review of Systems Pertinent items are noted in HPI.   Objective:    Temp(Src) 98.2 F (36.8 C) (Oral)  Ht 5\' 3"  (1.6 m)  Wt 176 lb (79.833 kg)  BMI 31.18 kg/m2  Breastfeeding? Yes  General:  alert and no distress   Breasts:  inspection negative, no nipple discharge or bleeding, no masses or nodularity palpable Abdomen:  Soft, NT.  Incision C, D, I. Pelvic:  Uterus NSSC, NT.   Assessment:     Normal postpartum exam. Pap smear not done at today's visit.   Plan:    1. Contraception: condoms 2. Contraceptive options discussed. 3. Follow up in: a few months or as needed.

## 2013-09-18 ENCOUNTER — Encounter: Payer: Self-pay | Admitting: Family Medicine

## 2013-09-18 ENCOUNTER — Ambulatory Visit (INDEPENDENT_AMBULATORY_CARE_PROVIDER_SITE_OTHER): Payer: Federal, State, Local not specified - PPO | Admitting: Family Medicine

## 2013-09-18 ENCOUNTER — Encounter (HOSPITAL_BASED_OUTPATIENT_CLINIC_OR_DEPARTMENT_OTHER): Payer: Self-pay | Admitting: *Deleted

## 2013-09-18 ENCOUNTER — Other Ambulatory Visit: Payer: Self-pay | Admitting: Orthopedic Surgery

## 2013-09-18 VITALS — BP 120/76 | HR 87 | Temp 98.4°F | Resp 16 | Wt 177.2 lb

## 2013-09-18 DIAGNOSIS — L980 Pyogenic granuloma: Secondary | ICD-10-CM

## 2013-09-18 MED ORDER — CEPHALEXIN 500 MG PO CAPS: 500.0000 mg | ORAL_CAPSULE | Freq: Two times a day (BID) | ORAL | Status: AC

## 2013-09-18 NOTE — Progress Notes (Signed)
  Subjective:    Patient ID: Savannah Henry, female    DOB: 03/20/1981, 32 y.o.   MRN: [id number]  HPI Thumb mass- base of R thumb w/ large, pedunculated ulcerated mass.  Very TTP.  First noted 3 months ago and was told it was a wart and told to tx w/ Compound W.  Has been using regularly and despite this, area has been enlarging, is more painful, now oozing and has bad odor.  Also now w/ surrounding redness of palmar skin.   Review of Systems For ROS see HPI     Objective:   Physical Exam  Vitals reviewed. Constitutional: She appears well-developed and well-nourished. No distress.  Skin: Skin is warm and dry. There is erythema.  Pedunculated pyogenic granuloma at base of R thumb w/ ulceration, surrounding erythema and induration of palmar skin consistent w/ cellulitis.          Assessment & Plan:

## 2013-09-18 NOTE — Assessment & Plan Note (Signed)
New.  Infected.  Needs to be removed but due to size and location (base of thumb joint), will refer to Hand Specialist for definitive tx.  Start abx.  Reviewed supportive care and red flags that should prompt return.  Pt expressed understanding and is in agreement w/ plan.

## 2013-09-18 NOTE — Progress Notes (Signed)
Pt had c-sec 8/14-mass on hand started drg-on antibiotics now Breast feeding

## 2013-09-18 NOTE — Patient Instructions (Addendum)
Start the Keflex twice daily We'll notify you of your hand specialist appt CONGRATS ON BABY!

## 2013-09-21 ENCOUNTER — Ambulatory Visit (HOSPITAL_BASED_OUTPATIENT_CLINIC_OR_DEPARTMENT_OTHER)
Admission: RE | Admit: 2013-09-21 | Discharge: 2013-09-21 | Disposition: A | Discharge status: Home / Self Care | Payer: Federal, State, Local not specified - PPO | Source: Ambulatory Visit | Attending: Orthopedic Surgery | Admitting: Orthopedic Surgery

## 2013-09-21 ENCOUNTER — Encounter (HOSPITAL_BASED_OUTPATIENT_CLINIC_OR_DEPARTMENT_OTHER): Payer: Self-pay | Admitting: Certified Registered Nurse Anesthetist

## 2013-09-21 ENCOUNTER — Encounter (HOSPITAL_BASED_OUTPATIENT_CLINIC_OR_DEPARTMENT_OTHER): Payer: Self-pay

## 2013-09-21 ENCOUNTER — Encounter (HOSPITAL_BASED_OUTPATIENT_CLINIC_OR_DEPARTMENT_OTHER)
Admission: RE | Disposition: A | Discharge status: Home / Self Care | Payer: Self-pay | Source: Ambulatory Visit | Attending: Orthopedic Surgery

## 2013-09-21 ENCOUNTER — Ambulatory Visit (HOSPITAL_BASED_OUTPATIENT_CLINIC_OR_DEPARTMENT_OTHER): Payer: Federal, State, Local not specified - PPO | Admitting: Certified Registered Nurse Anesthetist

## 2013-09-21 DIAGNOSIS — K219 Gastro-esophageal reflux disease without esophagitis: Secondary | ICD-10-CM | POA: Insufficient documentation

## 2013-09-21 DIAGNOSIS — E785 Hyperlipidemia, unspecified: Secondary | ICD-10-CM | POA: Insufficient documentation

## 2013-09-21 DIAGNOSIS — D1801 Hemangioma of skin and subcutaneous tissue: Secondary | ICD-10-CM | POA: Insufficient documentation

## 2013-09-21 HISTORY — PX: EXCISION METACARPAL MASS: SHX6372

## 2013-09-21 LAB — POCT HEMOGLOBIN-HEMACUE: Hemoglobin: 10.6 g/dL — ABNORMAL LOW (ref 12.0–15.0)

## 2013-09-21 SURGERY — EXCISION METACARPAL MASS
Anesthesia: General | Site: Hand | Laterality: Right | Wound class: Clean

## 2013-09-21 MED ORDER — ONDANSETRON HCL 4 MG/2ML IJ SOLN
INTRAMUSCULAR | Status: DC | PRN
  Administered 2013-09-21: 4 mg via INTRAMUSCULAR

## 2013-09-21 MED ORDER — OXYCODONE HCL 5 MG/5ML PO SOLN: 5.0000 mg | Freq: Once | ORAL | Status: DC | PRN

## 2013-09-21 MED ORDER — OXYCODONE-ACETAMINOPHEN 5-325 MG PO TABS: ORAL_TABLET | ORAL | Status: DC

## 2013-09-21 MED ORDER — PROPOFOL 10 MG/ML IV BOLUS
INTRAVENOUS | Status: DC | PRN
  Administered 2013-09-21: 200 mg via INTRAVENOUS

## 2013-09-21 MED ORDER — DEXAMETHASONE SODIUM PHOSPHATE 10 MG/ML IJ SOLN
INTRAMUSCULAR | Status: DC | PRN
  Administered 2013-09-21: 4 mg via INTRAVENOUS

## 2013-09-21 MED ORDER — CHLORHEXIDINE GLUCONATE 4 % EX LIQD: 60.0000 mL | Freq: Once | CUTANEOUS | Status: DC

## 2013-09-21 MED ORDER — HYDROMORPHONE HCL PF 1 MG/ML IJ SOLN
0.2500 mg | INTRAMUSCULAR | Status: DC | PRN
  Administered 2013-09-21 (×3): 0.5 mg via INTRAVENOUS

## 2013-09-21 MED ORDER — LACTATED RINGERS IV SOLN
INTRAVENOUS | Status: DC
  Administered 2013-09-21: 20 mL/h via INTRAVENOUS
  Administered 2013-09-21: 10:00:00 via INTRAVENOUS

## 2013-09-21 MED ORDER — BUPIVACAINE HCL (PF) 0.25 % IJ SOLN
INTRAMUSCULAR | Status: DC | PRN
  Administered 2013-09-21: 6 mL

## 2013-09-21 MED ORDER — PROMETHAZINE HCL 25 MG/ML IJ SOLN: 6.2500 mg | INTRAMUSCULAR | Status: DC | PRN

## 2013-09-21 MED ORDER — FENTANYL CITRATE 0.05 MG/ML IJ SOLN: 50.0000 ug | INTRAMUSCULAR | Status: DC | PRN

## 2013-09-21 MED ORDER — CEFAZOLIN SODIUM-DEXTROSE 2-3 GM-% IV SOLR
2.0000 g | INTRAVENOUS | Status: AC
  Administered 2013-09-21: 2 g via INTRAVENOUS

## 2013-09-21 MED ORDER — FENTANYL CITRATE 0.05 MG/ML IJ SOLN
INTRAMUSCULAR | Status: DC | PRN
  Administered 2013-09-21: 25 ug via INTRAVENOUS
  Administered 2013-09-21: 50 ug via INTRAVENOUS

## 2013-09-21 MED ORDER — LIDOCAINE HCL (CARDIAC) 20 MG/ML IV SOLN
INTRAVENOUS | Status: DC | PRN
  Administered 2013-09-21: 30 mg via INTRAVENOUS

## 2013-09-21 MED ORDER — MIDAZOLAM HCL 2 MG/2ML IJ SOLN: 1.0000 mg | INTRAMUSCULAR | Status: DC | PRN

## 2013-09-21 MED ORDER — OXYCODONE HCL 5 MG PO TABS: 5.0000 mg | ORAL_TABLET | Freq: Once | ORAL | Status: DC | PRN

## 2013-09-21 SURGICAL SUPPLY — 53 items
APL SKNCLS STERI-STRIP NONHPOA (GAUZE/BANDAGES/DRESSINGS)
BANDAGE COBAN STERILE 2 (GAUZE/BANDAGES/DRESSINGS) IMPLANT
BANDAGE CONFORM 2  STR LF (GAUZE/BANDAGES/DRESSINGS) IMPLANT
BANDAGE ELASTIC 3 VELCRO ST LF (GAUZE/BANDAGES/DRESSINGS) IMPLANT
BANDAGE GAUZE ELAST BULKY 4 IN (GAUZE/BANDAGES/DRESSINGS) IMPLANT
BANDAGE GAUZE STRT 1 STR LF (GAUZE/BANDAGES/DRESSINGS) IMPLANT
BENZOIN TINCTURE PRP APPL 2/3 (GAUZE/BANDAGES/DRESSINGS) IMPLANT
BLADE MINI RND TIP GREEN BEAV (BLADE) IMPLANT
BLADE SURG 15 STRL LF DISP TIS (BLADE) ×2 IMPLANT
BLADE SURG 15 STRL SS (BLADE) ×4
BNDG CMPR 9X4 STRL LF SNTH (GAUZE/BANDAGES/DRESSINGS)
BNDG CMPR MD 5X2 ELC HKLP STRL (GAUZE/BANDAGES/DRESSINGS)
BNDG COHESIVE 1X5 TAN STRL LF (GAUZE/BANDAGES/DRESSINGS) IMPLANT
BNDG ELASTIC 2 VLCR STRL LF (GAUZE/BANDAGES/DRESSINGS) IMPLANT
BNDG ESMARK 4X9 LF (GAUZE/BANDAGES/DRESSINGS) IMPLANT
BNDG PLASTER X FAST 3X3 WHT LF (CAST SUPPLIES) IMPLANT
BNDG PLSTR 9X3 FST ST WHT (CAST SUPPLIES)
CHLORAPREP W/TINT 26ML (MISCELLANEOUS) ×2 IMPLANT
CLOTH BEACON ORANGE TIMEOUT ST (SAFETY) ×2 IMPLANT
CORDS BIPOLAR (ELECTRODE) ×2 IMPLANT
COVER MAYO STAND STRL (DRAPES) ×2 IMPLANT
COVER TABLE BACK 60X90 (DRAPES) ×2 IMPLANT
CUFF TOURNIQUET SINGLE 18IN (TOURNIQUET CUFF) ×2 IMPLANT
DRAPE EXTREMITY T 121X128X90 (DRAPE) ×2 IMPLANT
DRAPE SURG 17X23 STRL (DRAPES) ×2 IMPLANT
GAUZE XEROFORM 1X8 LF (GAUZE/BANDAGES/DRESSINGS) ×2 IMPLANT
GLOVE BIO SURGEON STRL SZ 6.5 (GLOVE) ×1 IMPLANT
GLOVE BIO SURGEON STRL SZ7.5 (GLOVE) ×2 IMPLANT
GLOVE BIOGEL PI IND STRL 7.0 (GLOVE) IMPLANT
GLOVE BIOGEL PI IND STRL 8 (GLOVE) ×1 IMPLANT
GLOVE BIOGEL PI INDICATOR 7.0 (GLOVE) ×2
GLOVE BIOGEL PI INDICATOR 8 (GLOVE) ×1
GOWN BRE IMP PREV XXLGXLNG (GOWN DISPOSABLE) ×2 IMPLANT
GOWN PREVENTION PLUS XLARGE (GOWN DISPOSABLE) ×2 IMPLANT
NDL HYPO 25X1 1.5 SAFETY (NEEDLE) ×1 IMPLANT
NEEDLE HYPO 25X1 1.5 SAFETY (NEEDLE) ×2 IMPLANT
NS IRRIG 1000ML POUR BTL (IV SOLUTION) ×2 IMPLANT
PACK BASIN DAY SURGERY FS (CUSTOM PROCEDURE TRAY) ×2 IMPLANT
PAD CAST 3X4 CTTN HI CHSV (CAST SUPPLIES) IMPLANT
PAD CAST 4YDX4 CTTN HI CHSV (CAST SUPPLIES) IMPLANT
PADDING CAST ABS 4INX4YD NS (CAST SUPPLIES) ×1
PADDING CAST ABS COTTON 4X4 ST (CAST SUPPLIES) ×1 IMPLANT
PADDING CAST COTTON 3X4 STRL (CAST SUPPLIES)
PADDING CAST COTTON 4X4 STRL (CAST SUPPLIES)
SPONGE GAUZE 4X4 12PLY (GAUZE/BANDAGES/DRESSINGS) ×2 IMPLANT
STOCKINETTE 4X48 STRL (DRAPES) ×2 IMPLANT
STRIP CLOSURE SKIN 1/2X4 (GAUZE/BANDAGES/DRESSINGS) IMPLANT
SUT ETHILON 3 0 PS 1 (SUTURE) IMPLANT
SUT ETHILON 4 0 PS 2 18 (SUTURE) ×2 IMPLANT
SYR BULB 3OZ (MISCELLANEOUS) ×2 IMPLANT
SYR CONTROL 10ML LL (SYRINGE) ×2 IMPLANT
TOWEL OR 17X24 6PK STRL BLUE (TOWEL DISPOSABLE) ×4 IMPLANT
UNDERPAD 30X30 INCONTINENT (UNDERPADS AND DIAPERS) ×2 IMPLANT

## 2013-09-21 NOTE — Anesthesia Procedure Notes (Signed)
Procedure Name: LMA Insertion Date/Time: 09/21/2013 11:03 AM Performed by: Jovonta Levit D Pre-anesthesia Checklist: Patient identified, Emergency Drugs available, Suction available and Patient being monitored Patient Re-evaluated:Patient Re-evaluated prior to inductionOxygen Delivery Method: Circle System Utilized Preoxygenation: Pre-oxygenation with 100% oxygen Intubation Type: IV induction Ventilation: Mask ventilation without difficulty LMA: LMA inserted LMA Size: 4.0 Number of attempts: 1 Airway Equipment and Method: bite block Placement Confirmation: positive ETCO2 Tube secured with: Tape Dental Injury: Teeth and Oropharynx as per pre-operative assessment

## 2013-09-21 NOTE — Op Note (Signed)
[id number] 

## 2013-09-21 NOTE — Anesthesia Postprocedure Evaluation (Signed)
Anesthesia Post Note  Patient: Health and safety inspector  Procedure(s) Performed: Procedure(s) (LRB): RIGHT PALM EXCISION MASS (Right)  Anesthesia type: general  Patient location: PACU  Post pain: Pain level controlled  Post assessment: Patient's Cardiovascular Status Stable  Last Vitals:  Filed Vitals:   09/21/13 1245  BP: 128/87  Pulse: 66  Temp:   Resp: 18    Post vital signs: Reviewed and stable  Level of consciousness: sedated  Complications: No apparent anesthesia complications

## 2013-09-21 NOTE — Anesthesia Preprocedure Evaluation (Addendum)
Anesthesia Evaluation   Patient awake    Reviewed: Allergy & Precautions, H&P , NPO status , Patient's Chart, lab work & pertinent test results  History of Anesthesia Complications (+) PONV  Airway       Dental   Pulmonary asthma ,          Cardiovascular negative cardio ROS      Neuro/Psych PSYCHIATRIC DISORDERS Anxiety negative neurological ROS     GI/Hepatic Neg liver ROS, GERD-  Medicated,  Endo/Other  negative endocrine ROS  Renal/GU negative Renal ROS     Musculoskeletal   Abdominal   Peds  Hematology negative hematology ROS (+)   Anesthesia Other Findings   Reproductive/Obstetrics                          Anesthesia Physical Anesthesia Plan  ASA: II  Anesthesia Plan: General   Post-op Pain Management:    Induction: Intravenous  Airway Management Planned: LMA  Additional Equipment:   Intra-op Plan:   Post-operative Plan: Extubation in OR  Informed Consent: I have reviewed the patients History and Physical, chart, labs and discussed the procedure including the risks, benefits and alternatives for the proposed anesthesia with the patient or authorized representative who has indicated his/her understanding and acceptance.   Dental advisory given  Plan Discussed with: CRNA, Anesthesiologist and Surgeon  Anesthesia Plan Comments:        Anesthesia Quick Evaluation

## 2013-09-21 NOTE — Brief Op Note (Signed)
09/21/2013  11:36 AM  PATIENT:  Savannah Henry  32 y.o. female  PRE-OPERATIVE DIAGNOSIS:  RIGHT PALM PYOGENIC GRANULOMA  POST-OPERATIVE DIAGNOSIS:  right palm pyogenic granuloma  PROCEDURE:  Procedure(s): RIGHT PALM EXCISION MASS  SURGEON:  Surgeon(s): Tami Ribas, MD  PHYSICIAN ASSISTANT:   ASSISTANTS: none   ANESTHESIA:   general  EBL:     DRAINS: none   LOCAL MEDICATIONS USED:  MARCAINE     SPECIMEN:  Source of Specimen:  right palm  DISPOSITION OF SPECIMEN:  PATHOLOGY  COUNTS:  YES  TOURNIQUET:   Total Tourniquet Time Documented: Upper Arm (Right) - 23 minutes Total: Upper Arm (Right) - 23 minutes   DICTATION: .Other Dictation: Dictation Number [id number]  PLAN OF CARE: Discharge to home after PACU

## 2013-09-21 NOTE — H&P (Signed)
Savannah Henry is an 32 y.o. female.   Chief Complaint: right hand mass HPI: 32 yo female with growth on right thenar eminence x 3-4 months.  She states it has enlarged over that time.  Has been treating with compound w.  Notes no injury to area and no previous problems.  No fevers, chills, night sweats.    Past Medical History  Diagnosis Date  . Chicken pox   . Obesity   . Hyperlipidemia   . Anxiety   . PONV (postoperative nausea and vomiting)   . Dysrhythmia     palpitations with pregnancy  . Asthma     no meds needed, maybe 1-2 times per year  . GERD (gastroesophageal reflux disease)     no meds    Past Surgical History  Procedure Laterality Date  . Cholecystectomy  2007  . Cesarean section  2005  . Cesarean section N/A 08/06/2013    Procedure: REPEAT CESAREAN SECTION;  Surgeon: Brock Bad, MD;  Location: [facility];  Service: Obstetrics;  Laterality: N/A;    Family History  Problem Relation Age of Onset  . Arthritis Mother   . ALS Father   . Breast cancer Maternal Aunt   . Lung cancer Maternal Uncle   . Diabetes Paternal Aunt   . Arthritis Maternal Grandmother   . Breast cancer Maternal Grandmother   . Heart disease Paternal Grandmother   . Hypertension Paternal Grandmother   . Diabetes Paternal Grandfather    Social History:  reports that she has never smoked. She has never used smokeless tobacco. She reports that she does not drink alcohol or use illicit drugs.  Allergies: No Known Allergies  Medications Prior to Admission  Medication Sig Dispense Refill  . cephALEXin (KEFLEX) 500 MG capsule Take 1 capsule (500 mg total) by mouth 2 (two) times daily.  14 capsule  0  . ibuprofen (ADVIL,MOTRIN) 600 MG tablet Take 1 tablet (600 mg total) by mouth every 6 (six) hours as needed for pain.  30 tablet  5  . Misc. Devices (AMEDA PURELY YOURS BREAST PUMP) MISC       . Prenatal Vit-Fe Fumarate-FA (PRENATAL MULTIVITAMIN) TABS Take 1 tablet by mouth daily at 12 noon.         No results found for this or any previous visit (from the past 48 hour(s)).  No results found.   A comprehensive review of systems was negative except for: Ears, nose, mouth, throat, and face: positive for hearing loss and tinnitus Respiratory: positive for asthma  Height 5\' 3"  (1.6 m), weight 177 lb (80.287 kg), last menstrual period 10/19/2012.  General appearance: alert, cooperative and appears stated age Head: Normocephalic, without obvious abnormality, atraumatic Neck: supple, symmetrical, trachea midline Resp: clear to auscultation bilaterally Cardio: regular rate and rhythm GI: non tender Extremities: intact sensation and capillary refill all digits. +epl/fpl/io.  ttp at mass.  reddened growth coming through skin at right thenar eminence.  no fluctuance or erythema. Pulses: 2+ and symmetric Skin: as above Neurologic: Grossly normal Incision/Wound: As above  Assessment/Plan Right hand pyogenic granuloma.  Non operative and operative treatment options were discussed with the patient and patient wishes to proceed with operative treatment. Risks, benefits, and alternatives of surgery were discussed and the patient agrees with the plan of care.   Josue Falconi R 09/21/2013, 9:16 AM

## 2013-09-21 NOTE — Transfer of Care (Signed)
Immediate Anesthesia Transfer of Care Note  Patient: Savannah Henry  Procedure(s) Performed: Procedure(s): RIGHT PALM EXCISION MASS (Right)  Patient Location: PACU  Anesthesia Type:General  Level of Consciousness: awake, alert , oriented and patient cooperative  Airway & Oxygen Therapy: Patient Spontanous Breathing and Patient connected to face mask oxygen  Post-op Assessment: Report given to PACU RN and Post -op Vital signs reviewed and stable  Post vital signs: Reviewed and stable  Complications: No apparent anesthesia complications

## 2013-09-22 ENCOUNTER — Encounter (HOSPITAL_BASED_OUTPATIENT_CLINIC_OR_DEPARTMENT_OTHER): Payer: Self-pay | Admitting: Orthopedic Surgery

## 2013-09-22 NOTE — Op Note (Signed)
Savannah Henry, FULLAM              ACCOUNT NO.:  [id number]  MEDICAL RECORD NO.:  [id number]  LOCATION:                                 FACILITY:  PHYSICIAN:  Betha Loa, MD        DATE OF BIRTH:  Apr 25, 1981  DATE OF PROCEDURE:  09/21/2013 DATE OF DISCHARGE:                              OPERATIVE REPORT   PREOPERATIVE DIAGNOSIS:  Right palm pyogenic granuloma.  POSTOPERATIVE DIAGNOSIS:  Right palm pyogenic granuloma.  PROCEDURE:  Excision of mass, right palm, 1 cm in size with 1-mm margins.  SURGEON:  Betha Loa, M.D.  ASSISTANT:  None.  ANESTHESIA:  General.  IV FLUIDS:  Per Anesthesia flow sheet.  ESTIMATED BLOOD LOSS:  Minimal.  COMPLICATIONS:  None.  SPECIMENS:  Right palm mass to Pathology.  TOURNIQUET TIME:  23 minutes.  DISPOSITION:  Stable to PACU.  INDICATIONS:  Ms. [name] is a 32 year old female who has noted 3 to 4 months of mass on her right palm.  She has been treating this with compound W.  She was referred to me last week.  It is painful and bothersome to her.  She wished to have it excised.  Risks, benefits, and alternatives of surgery were discussed including the risk of blood loss; infection; damage to nerves, vessels, tendons, ligaments, bone; failure of surgery; need for additional surgery; complications with wound healing; recurrence of mass.  She voiced understanding of these risks and elected to proceed.  OPERATIVE COURSE:  After being identified preoperatively by myself, the patient and I agreed upon the procedure and site of the procedure. Surgical site was marked.  Risks, benefits, and alternatives of surgery were reviewed and she wished to proceed.  Surgical consent had been signed.  She was transferred to the operating room and placed on the operating room table in supine position with the right upper extremity on an arm board.  General anesthesia was induced by anesthesiologist. Antibiotics were held for potential cultures.  Right  upper extremity was prepped and draped in normal sterile orthopedic fashion.  Surgical pause was performed between surgeons, Anesthesia, operating staff, and all were in agreement as to the patient, procedure, and site of procedure. Tourniquet at the proximal aspect of the extremity was inflated to 250 mmHg after exsanguination of the hand by grabbing the forearm by Esmarch bandage.  A small incision was made proximal to the mass.  This was carried into subcutaneous tissues by spreading technique.  The mass was then easily freed up from a surrounding skin attachments.  A 1-mm cuff of margin was taken.  There was no deep adherence.  There was no purulence.  There was no further mass in the subcutaneous tissues.  The radial and ulnar digital nerve to the thumb were identified and were intact.  The wound was copiously irrigated with sterile saline.  It was closed with 4-0 nylon in a horizontal mattress fashion.  The wound was injected with 6 mL of 0.25% plain Marcaine to aid in postoperative analgesia.  The mass was sent to Pathology for examination.  The wound was then dressed with sterile Xeroform, 4x4s, and wrapped with a Kerlix and a Coban dressing lightly.  Tourniquet was deflated at 23 minutes. Fingertips were pink with brisk capillary refill after deflation of tourniquet.  The operative drapes were broken down.  The patient was awoken from anesthesia safely.  She was transferred back to the stretcher and taken to the PACU in stable condition.  I will see her back in the office in 1 week for postoperative followup.  I will give her Percocet 5/325, 1 to 2 p.o. q.6 hours p.r.n. pain, dispensed #30. She is going to bottle feed if she is requiring any narcotic pain medication.  If she is able to tolerate her discomfort with ibuprofen only, that will be adequate.     Betha Loa, MD     KK/MEDQ  D:  09/21/2013  T:  09/22/2013  Job:  [id number]

## 2013-10-22 ENCOUNTER — Encounter: Payer: Self-pay | Admitting: Obstetrics

## 2013-10-22 ENCOUNTER — Other Ambulatory Visit: Payer: Self-pay

## 2013-10-22 ENCOUNTER — Ambulatory Visit (INDEPENDENT_AMBULATORY_CARE_PROVIDER_SITE_OTHER): Payer: Federal, State, Local not specified - PPO | Admitting: Obstetrics

## 2013-10-22 VITALS — BP 130/85 | HR 77 | Temp 99.0°F | Ht 63.0 in | Wt 185.0 lb

## 2013-10-22 DIAGNOSIS — Z01419 Encounter for gynecological examination (general) (routine) without abnormal findings: Secondary | ICD-10-CM

## 2013-10-22 DIAGNOSIS — A499 Bacterial infection, unspecified: Secondary | ICD-10-CM

## 2013-10-22 DIAGNOSIS — B373 Candidiasis of vulva and vagina: Secondary | ICD-10-CM

## 2013-10-22 DIAGNOSIS — N760 Acute vaginitis: Secondary | ICD-10-CM

## 2013-10-22 DIAGNOSIS — B9689 Other specified bacterial agents as the cause of diseases classified elsewhere: Secondary | ICD-10-CM

## 2013-10-22 MED ORDER — METRONIDAZOLE 500 MG PO TABS: 500.0000 mg | ORAL_TABLET | Freq: Two times a day (BID) | ORAL | Status: DC

## 2013-10-22 NOTE — Progress Notes (Signed)
Subjective:     Savannah Henry is a 32 y.o. female who presents for an annual exam and pap smear. She is 11 weeks postpartum following a cesarean section. I have fully reviewed the prenatal and intrapartum course. The delivery was at 39 gestational weeks. Outcome: cesarean section. Anesthesia: epidural. Postpartum course has been nromal. Baby's course has been normal. Baby is feeding by breast feeding and bottle feeding with breast milk. Bleeding no bleeding. Bowel function is still having some constipation. Bladder function is normal. Patient is not sexually active. Contraception method is abstinence. Postpartum depression screening: negative. States she is having vaginal irritation with greyish discharge denies odor.  The following portions of the patient's history were reviewed and updated as appropriate: allergies, current medications, past family history, past medical history, past social history, past surgical history and problem list.  Review of Systems Pertinent items are noted in HPI.   Objective:    BP 130/85  Pulse 77  Temp(Src) 99 F (37.2 C)  Ht 5\' 3"  (1.6 m)  Wt 185 lb (83.915 kg)  BMI 32.78 kg/m2  LMP 10/15/2013  Breastfeeding? Yes  General:  alert and no distress   Breasts:  inspection negative, no nipple discharge or bleeding, no masses or nodularity palpable Abdomen - soft, NT.  Incision C, D, I. Pelvic:  NEFG.  Vagina and cervix normal.              Uterus NSSC, NT.  Adnexa negative.   Assessment:    Annual exam.  Doing well.  Plan:    1. Contraception: oral progesterone-only contraceptive 2. Flagyl and Diflucan Rx 3. Follow up in: 1 year or as needed.

## 2013-10-23 ENCOUNTER — Encounter: Payer: Self-pay | Admitting: Obstetrics

## 2013-10-23 DIAGNOSIS — B9689 Other specified bacterial agents as the cause of diseases classified elsewhere: Secondary | ICD-10-CM | POA: Insufficient documentation

## 2013-10-23 DIAGNOSIS — B373 Candidiasis of vulva and vagina: Secondary | ICD-10-CM | POA: Insufficient documentation

## 2013-10-24 LAB — WET PREP BY MOLECULAR PROBE: Trichomonas vaginosis: NEGATIVE

## 2013-10-24 LAB — GC/CHLAMYDIA PROBE AMP
CT Probe RNA: NEGATIVE
GC Probe RNA: NEGATIVE

## 2013-10-26 LAB — PAP IG W/ RFLX HPV ASCU

## 2013-12-31 ENCOUNTER — Telehealth: Payer: Self-pay | Admitting: *Deleted

## 2013-12-31 MED ORDER — SERTRALINE HCL 50 MG PO TABS: ORAL_TABLET | ORAL | Status: DC

## 2013-12-31 NOTE — Telephone Encounter (Signed)
Patient called and stated that she would like something for anxiety that is safe with breast feeding. Patient states that she was aon something for anxiety before but had to quit because she got pregnant. Please advise. SW

## 2013-12-31 NOTE — Telephone Encounter (Signed)
Rx filled and follow up appointment scheduled

## 2013-12-31 NOTE — Telephone Encounter (Signed)
Zoloft is the only medication approved for use while breastfeeding.  Zoloft 50mg  tabs, start w/ 1/2 tab x2 weeks and then increase to 1 tab daily.  #30, 1 refill.  Needs OV in 1 month to make sure meds are working

## 2014-02-15 ENCOUNTER — Ambulatory Visit: Payer: Federal, State, Local not specified - PPO | Admitting: Family Medicine

## 2014-02-26 ENCOUNTER — Encounter: Payer: Self-pay | Admitting: Family Medicine

## 2014-02-26 ENCOUNTER — Ambulatory Visit (INDEPENDENT_AMBULATORY_CARE_PROVIDER_SITE_OTHER): Payer: Federal, State, Local not specified - PPO | Admitting: Family Medicine

## 2014-02-26 VITALS — BP 112/78 | HR 105 | Temp 98.4°F | Resp 16 | Wt 194.0 lb

## 2014-02-26 DIAGNOSIS — F341 Dysthymic disorder: Secondary | ICD-10-CM

## 2014-02-26 DIAGNOSIS — F418 Other specified anxiety disorders: Secondary | ICD-10-CM

## 2014-02-26 NOTE — Patient Instructions (Signed)
Follow up in 4-6 weeks to recheck mood Start taking the Zoloft every day- it will help See if you can get another counselor If push comes to shove- decide what's best for you Call with any questions or concerns Hang in there!!!

## 2014-02-26 NOTE — Progress Notes (Signed)
   Subjective:    Patient ID: Savannah Henry, female    DOB: [birth year], 33 y.o.   MRN: [id number]  HPI Depression- was doing very well post-partum and then returned to work and 'there was drama'.  Was started on Zoloft in January but pt is not taking regularly.  Pt has met w/ EAP but doesn't feel this has been helpful.  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  Pt has been missing work and taking time w/o pay b/c she has no days remaining after maternity leave.   Review of Systems For ROS see HPI     Objective:   Physical Exam  Vitals reviewed. Constitutional: She is oriented to person, place, and time. She appears well-developed and well-nourished. No distress.  Neurological: She is alert and oriented to person, place, and time.  Skin: Skin is warm and dry.  Psychiatric: She has a normal mood and affect. Her behavior is normal. Thought content normal.          Assessment & Plan:

## 2014-02-26 NOTE — Progress Notes (Signed)
Pre visit review using our clinic review tool, if applicable. No additional management support is needed unless otherwise documented below in the visit note. 

## 2014-02-27 NOTE — Assessment & Plan Note (Signed)
Deteriorated.  Started on Zoloft in Jan but has not been taking regularly.  Encouraged daily use.  Pt to ask for different EAP counselor as her's is not a good fit.  Will follow closely.

## 2014-03-01 ENCOUNTER — Telehealth: Payer: Self-pay | Admitting: Family Medicine
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
## 2014-03-01 NOTE — Telephone Encounter (Signed)
Ok to write pt out of work for the week.  Now that pt has physical proof of work place Education officer, [employer], she needs to hold onto the note and consider consulting an attorney for work place harrassment/intimidation

## 2014-03-01 NOTE — Telephone Encounter (Signed)
Patient called and stated that she was seen on Friday by dr Birdie Riddle regarding anxiety. Patient stated that an incident happened at work this morning and patient wanted to discuss it with a nurse or Dr Birdie Riddle.  Please advise

## 2014-03-01 NOTE — Telephone Encounter (Signed)
Letter printed and placed at front desk, ready for pick up.  Patient made aware.

## 2014-03-04 ENCOUNTER — Telehealth: Payer: Self-pay | Admitting: Family Medicine

## 2014-03-04 NOTE — Telephone Encounter (Signed)
Patient called and stated that her note for work needed to be changed. Patient states that it needs to have on it due to work related stress and anxiety patient will be out of work from 02/26/2014-03/08/2014. Please advise

## 2014-03-04 NOTE — Telephone Encounter (Signed)
Letter has been corrected as requested.  Letter printed and placed up front for pick up.  Patient made aware.

## 2014-03-12 NOTE — Telephone Encounter (Signed)
Patient continues to experience work related stress and anxiety.  States that after speaking with her supervisor, she was told that she needs a letter that will allow her to be out of work periodically.  Patient was informed that she may need intermittent FMLA to cover her and advised her to obtain these forms from HR and have them sent here.  An appt was also scheduled for 03/18/14 @ 2pm.

## 2014-03-12 NOTE — Telephone Encounter (Addendum)
Patient called and stated that yesterday she spoke with someone about her letter for work but no one returned her phone call. Patient would like for a nurse to call her back so she can explain what her letter needs to say. Please advise.

## 2014-03-18 ENCOUNTER — Encounter: Payer: Self-pay | Admitting: Family Medicine

## 2014-03-18 ENCOUNTER — Other Ambulatory Visit: Payer: Self-pay | Admitting: General Practice

## 2014-03-18 ENCOUNTER — Ambulatory Visit (INDEPENDENT_AMBULATORY_CARE_PROVIDER_SITE_OTHER): Payer: Federal, State, Local not specified - PPO | Admitting: Family Medicine

## 2014-03-18 VITALS — BP 124/80 | HR 85 | Temp 98.0°F | Resp 16 | Wt 194.0 lb

## 2014-03-18 DIAGNOSIS — F418 Other specified anxiety disorders: Secondary | ICD-10-CM

## 2014-03-18 DIAGNOSIS — F341 Dysthymic disorder: Secondary | ICD-10-CM

## 2014-03-18 MED ORDER — SERTRALINE HCL 50 MG PO TABS
50.0000 mg | ORAL_TABLET | Freq: Every day | ORAL | Status: DC
Start: 2014-03-18 — End: 2015-01-10

## 2014-03-18 NOTE — Assessment & Plan Note (Signed)
Deteriorated.  Pt now leaving work early and inability to attend work at all due to stressful work environment.  Note provided for pt to intermittently miss work.  Names and #s of therapists provided.  Pt STRONGLY encouraged to seek counseling b/c I will not be able to continue to write her out of work w/o augmenting her current tx plan.  Will follow closely.
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
## 2014-03-18 NOTE — Patient Instructions (Signed)
Follow up in 3-4 weeks to recheck mood Please call and schedule counseling ASAP Continue the Zoloft daily Call with any questions or concerns Hang in there!

## 2014-03-18 NOTE — Progress Notes (Signed)
Pre visit review using our clinic review tool, if applicable. No additional management support is needed unless otherwise documented below in the visit note. 

## 2014-10-18 ENCOUNTER — Encounter: Payer: Self-pay | Admitting: Family Medicine

## 2015-01-10 ENCOUNTER — Ambulatory Visit (INDEPENDENT_AMBULATORY_CARE_PROVIDER_SITE_OTHER): Payer: Federal, State, Local not specified - PPO | Admitting: Family Medicine

## 2015-01-10 ENCOUNTER — Encounter: Payer: Self-pay | Admitting: Family Medicine

## 2015-01-10 VITALS — BP 124/84 | HR 95 | Temp 98.1°F | Resp 16 | Wt 203.5 lb

## 2015-01-10 DIAGNOSIS — F418 Other specified anxiety disorders: Secondary | ICD-10-CM

## 2015-01-10 MED ORDER — SERTRALINE HCL 50 MG PO TABS: 50.0000 mg | ORAL_TABLET | Freq: Every day | ORAL | Status: DC

## 2015-01-10 MED ORDER — ALPRAZOLAM 0.5 MG PO TABS: 0.5000 mg | ORAL_TABLET | Freq: Two times a day (BID) | ORAL | Status: DC | PRN

## 2015-01-10 NOTE — Progress Notes (Signed)
Pre visit review using our clinic review tool, if applicable. No additional management support is needed unless otherwise documented below in the visit note. 

## 2015-01-10 NOTE — Patient Instructions (Signed)
Follow up in 1 month to recheck mood Restart the Zoloft daily Please speak to an HR rep or hire an employment lawyer Call and schedule an appt w/ counselor- this is very important We cannot do Xanax b/c you are breastfeeding Call with any questions or concerns Hang in there!!!
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
## 2015-01-25 ENCOUNTER — Telehealth: Payer: Self-pay | Admitting: Family Medicine

## 2015-01-25 NOTE — Telephone Encounter (Signed)
Yes, can provide note for missed work when she arrives for appt tomorrow

## 2015-01-25 NOTE — Telephone Encounter (Signed)
Letter typed and printed for provider to ok and sign.

## 2015-01-25 NOTE — Telephone Encounter (Signed)
Caller name: Macil Relation to pt: self Call back number: [phone] Pharmacy:  Reason for call:   Patient doesn't think anxiety meds are working. Appointment scheduled for 01/27/15. Patient wants to know if Dr. Birdie Riddle will write her a work note for today?

## 2015-01-27 ENCOUNTER — Ambulatory Visit (INDEPENDENT_AMBULATORY_CARE_PROVIDER_SITE_OTHER): Payer: Federal, State, Local not specified - PPO | Admitting: Family Medicine

## 2015-01-27 ENCOUNTER — Encounter: Payer: Self-pay | Admitting: Family Medicine

## 2015-01-27 VITALS — BP 134/86 | HR 77 | Temp 98.3°F | Resp 18 | Wt 201.6 lb

## 2015-01-27 DIAGNOSIS — F418 Other specified anxiety disorders: Secondary | ICD-10-CM

## 2015-01-27 DIAGNOSIS — N912 Amenorrhea, unspecified: Secondary | ICD-10-CM

## 2015-01-27 LAB — POCT URINE PREGNANCY: PREG TEST UR: NEGATIVE

## 2015-01-27 MED ORDER — SERTRALINE HCL 100 MG PO TABS: 100.0000 mg | ORAL_TABLET | Freq: Every day | ORAL | Status: DC
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
## 2015-01-27 NOTE — Assessment & Plan Note (Signed)
New.  Pt reports she has not had her usual period.  Upreg is negative.  Suspect her period is late due to extreme emotional stress.  Discussed w/ pt.

## 2015-01-27 NOTE — Progress Notes (Signed)
   Subjective:    Patient ID: Savannah Henry, female    DOB: [birth year], 34 y.o.   MRN: [id number]  HPI Depression/anxiety- 'i'm just having a hard time'.  Difficulty going to work.  Pt is going through an internal investigation at work.  Pt has a binder full of information regarding her harrassment at work.  Pt is seeing EAP later today.  Has appt w/ private counselor next week.  Amenorrhea- pt has not had period x1 month.  Fearful of pregnancy.    Review of Systems For ROS see HPI     Objective:   Physical Exam  Constitutional: She is oriented to person, place, and time. She appears well-developed and well-nourished. No distress.  Neurological: She is alert and oriented to person, place, and time.  Skin: Skin is warm and dry.  Psychiatric:  Anxious, psychomotor agitation  Vitals reviewed.         Assessment & Plan:

## 2015-01-27 NOTE — Patient Instructions (Signed)
Follow up in 4-6 weeks to recheck mood Increase the Zoloft to 100mg  daily- 2 tabs of your current meds, 1 of the new prescription YOU ARE NOT PREGNANT Call with any questions or concerns Hang in there!! Happy Early Rudene Anda!!!

## 2015-01-27 NOTE — Progress Notes (Signed)
Pre visit review using our clinic review tool, if applicable. No additional management support is needed unless otherwise documented below in the visit note. 

## 2015-02-04 ENCOUNTER — Ambulatory Visit: Payer: Federal, State, Local not specified - PPO | Admitting: Psychology

## 2015-02-11 ENCOUNTER — Ambulatory Visit: Payer: Federal, State, Local not specified - PPO | Admitting: Family Medicine

## 2015-02-25 ENCOUNTER — Ambulatory Visit (INDEPENDENT_AMBULATORY_CARE_PROVIDER_SITE_OTHER): Payer: Federal, State, Local not specified - PPO | Admitting: Psychology

## 2015-02-25 DIAGNOSIS — F4323 Adjustment disorder with mixed anxiety and depressed mood: Secondary | ICD-10-CM

## 2015-03-18 ENCOUNTER — Ambulatory Visit (INDEPENDENT_AMBULATORY_CARE_PROVIDER_SITE_OTHER): Payer: Federal, State, Local not specified - PPO | Admitting: Family Medicine

## 2015-03-18 ENCOUNTER — Encounter: Payer: Self-pay | Admitting: Family Medicine

## 2015-03-18 VITALS — BP 126/80 | HR 107 | Temp 98.3°F | Resp 16 | Wt 202.5 lb

## 2015-03-18 DIAGNOSIS — F418 Other specified anxiety disorders: Secondary | ICD-10-CM

## 2015-03-18 DIAGNOSIS — H9192 Unspecified hearing loss, left ear: Secondary | ICD-10-CM

## 2015-03-18 MED ORDER — CITALOPRAM HYDROBROMIDE 40 MG PO TABS: 40.0000 mg | ORAL_TABLET | Freq: Every day | ORAL | Status: DC

## 2015-03-18 NOTE — Progress Notes (Signed)
Pre visit review using our clinic review tool, if applicable. No additional management support is needed unless otherwise documented below in the visit note. 

## 2015-03-18 NOTE — Assessment & Plan Note (Signed)
Improved.  Pt's mood and anxiety are better but the Zoloft is causing GI side effects- cramping, bloating, diarrhea.  Based on this, will switch to Celexa and see if this is better tolerated.  Pt expressed understanding and is in agreement w/ plan.

## 2015-03-18 NOTE — Patient Instructions (Addendum)
Schedule your complete physical in 3-4 months STOP the Zoloft START the Celexa daily Keep up the good work on the counseling Call with any questions or concerns Hang in there!!!

## 2015-03-18 NOTE — Progress Notes (Signed)
   Subjective:    Patient ID: Savannah Henry, female    DOB: 1981/04/02, 34 y.o.   MRN: [id number]  HPI Depression/anxiety- pt has been off work for a week while on vacation and reports feeling 'fine'.  Things are not better at work.  There is now a harrassment investigation on pt's behalf.  Supervisor has been transferred to [place].  Pt reports Zoloft is helping but causing diarrhea.  Pt is now seeing Terri.  Had GI sxs at both 50 and 100mg  doses.     Review of Systems For ROS see HPI     Objective:   Physical Exam  Constitutional: She is oriented to person, place, and time. She appears well-developed and well-nourished. No distress.  HENT:  Head: Normocephalic and atraumatic.  Neurological: She is alert and oriented to person, place, and time.  Skin: Skin is warm and dry.  Psychiatric: She has a normal mood and affect. Her behavior is normal. Thought content normal.  Vitals reviewed.         Assessment & Plan:

## 2015-03-25 ENCOUNTER — Ambulatory Visit (INDEPENDENT_AMBULATORY_CARE_PROVIDER_SITE_OTHER): Payer: Federal, State, Local not specified - PPO | Admitting: Psychology

## 2015-03-25 DIAGNOSIS — F4323 Adjustment disorder with mixed anxiety and depressed mood: Secondary | ICD-10-CM | POA: Diagnosis not present

## 2015-04-13 ENCOUNTER — Ambulatory Visit: Payer: Federal, State, Local not specified - PPO | Admitting: Psychology

## 2015-04-29 ENCOUNTER — Ambulatory Visit: Payer: Federal, State, Local not specified - PPO | Admitting: Psychology

## 2015-07-19 ENCOUNTER — Telehealth: Payer: Self-pay | Admitting: Family Medicine

## 2015-07-19 NOTE — Telephone Encounter (Signed)
pre visit letter mailed 07/06/15

## 2015-07-26 ENCOUNTER — Telehealth: Payer: Self-pay | Admitting: *Deleted

## 2015-07-26 NOTE — Telephone Encounter (Signed)
Unable to reach patient at time of Pre-Visit Call.  Left message for patient to return call when available.    

## 2015-07-27 ENCOUNTER — Encounter: Payer: Self-pay | Admitting: Family Medicine

## 2015-07-27 ENCOUNTER — Ambulatory Visit (INDEPENDENT_AMBULATORY_CARE_PROVIDER_SITE_OTHER): Payer: Federal, State, Local not specified - PPO | Admitting: Family Medicine

## 2015-07-27 VITALS — BP 126/78 | HR 107 | Temp 98.0°F | Resp 16 | Ht 63.0 in | Wt 206.2 lb

## 2015-07-27 DIAGNOSIS — Z01419 Encounter for gynecological examination (general) (routine) without abnormal findings: Secondary | ICD-10-CM

## 2015-07-27 DIAGNOSIS — Z0000 Encounter for general adult medical examination without abnormal findings: Secondary | ICD-10-CM | POA: Diagnosis not present

## 2015-07-27 NOTE — Assessment & Plan Note (Signed)
Pt's PE WNL w/ exception of obesity.  Check labs.  Due for pap next year.  Anticipatory guidance provided.

## 2015-07-27 NOTE — Patient Instructions (Signed)
Follow up in 1 year or as needed We'll notify you of your lab results and make any changes if needed Continue to work on healthy diet and regular exercise- you can do it!!! Call with any questions or concerns Enjoy the rest of your summer!!! 

## 2015-07-27 NOTE — Progress Notes (Signed)
   Subjective:    Patient ID: Savannah Henry, female    DOB: 1981-04-17, 34 y.o.   MRN: [id number]  HPI CPE- UTD on pap, due next year.   Review of Systems Patient reports no vision/ hearing changes, adenopathy,fever, weight change,  persistant/recurrent hoarseness , swallowing issues, chest pain, palpitations, edema, persistant/recurrent cough, hemoptysis, dyspnea (rest/exertional/paroxysmal nocturnal), gastrointestinal bleeding (melena, rectal bleeding), abdominal pain, significant heartburn, bowel changes, GU symptoms (dysuria, hematuria, incontinence), Gyn symptoms (abnormal  bleeding, pain),  syncope, focal weakness, memory loss, numbness & tingling, skin/hair/nail changes, abnormal bruising or bleeding, anxiety, or depression.     Objective:   Physical Exam General Appearance:    Alert, cooperative, no distress, appears stated age, obese  Head:    Normocephalic, without obvious abnormality, atraumatic  Eyes:    PERRL, conjunctiva/corneas clear, EOM's intact, fundi    benign, both eyes  Ears:    Normal TM's and external ear canals, both ears  Nose:   Nares normal, septum midline, mucosa normal, no drainage    or sinus tenderness  Throat:   Lips, mucosa, and tongue normal; teeth and gums normal  Neck:   Supple, symmetrical, trachea midline, no adenopathy;    Thyroid: no enlargement/tenderness/nodules  Back:     Symmetric, no curvature, ROM normal, no CVA tenderness  Lungs:     Clear to auscultation bilaterally, respirations unlabored  Chest Wall:    No tenderness or deformity   Heart:    Regular rate and rhythm, S1 and S2 normal, no murmur, rub   or gallop  Breast Exam:    Deferred  Abdomen:     Soft, non-tender, bowel sounds active all four quadrants,    no masses, no organomegaly  Genitalia:    Deferred  Rectal:    Extremities:   Extremities normal, atraumatic, no cyanosis or edema  Pulses:   2+ and symmetric all extremities  Skin:   Skin color, texture, turgor normal, no  rashes or lesions  Lymph nodes:   Cervical, supraclavicular, and axillary nodes normal  Neurologic:   CNII-XII intact, normal strength, sensation and reflexes    throughout          Assessment & Plan:

## 2015-07-27 NOTE — Progress Notes (Signed)
Pre visit review using our clinic review tool, if applicable. No additional management support is needed unless otherwise documented below in the visit note. 

## 2015-07-28 ENCOUNTER — Other Ambulatory Visit: Payer: Self-pay | Admitting: General Practice

## 2015-07-28 LAB — LIPID PANEL
CHOL/HDL RATIO: 5
CHOLESTEROL: 190 mg/dL (ref 0–200)
HDL: 41.8 mg/dL (ref >39.00)
LDL CALC: 137 mg/dL — AB (ref 0–99)
NonHDL: 147.77
Triglycerides: 53 mg/dL (ref 0.0–149.0)
VLDL: 10.6 mg/dL (ref 0.0–40.0)

## 2015-07-28 LAB — CBC WITH DIFFERENTIAL/PLATELET
BASOS PCT: 0.4 % (ref 0.0–3.0)
Basophils Absolute: 0 10*3/uL (ref 0.0–0.1)
Eosinophils Absolute: 0.1 10*3/uL (ref 0.0–0.7)
Eosinophils Relative: 1.4 % (ref 0.0–5.0)
HCT: 36.5 % (ref 36.0–46.0)
Hemoglobin: 11.8 g/dL — ABNORMAL LOW (ref 12.0–15.0)
Lymphocytes Relative: 27.5 % (ref 12.0–46.0)
Lymphs Abs: 2.4 10*3/uL (ref 0.7–4.0)
MCHC: 32.4 g/dL (ref 30.0–36.0)
MCV: 79.7 fl (ref 78.0–100.0)
MONO ABS: 0.6 10*3/uL (ref 0.1–1.0)
Monocytes Relative: 7.2 % (ref 3.0–12.0)
NEUTROS PCT: 63.5 % (ref 43.0–77.0)
Neutro Abs: 5.4 10*3/uL (ref 1.4–7.7)
PLATELETS: 344 10*3/uL (ref 150.0–400.0)
RBC: 4.58 Mil/uL (ref 3.87–5.11)
RDW: 14.1 % (ref 11.5–15.5)
WBC: 8.6 10*3/uL (ref 4.0–10.5)

## 2015-07-28 LAB — HEPATIC FUNCTION PANEL
ALK PHOS: 119 U/L — AB (ref 39–117)
ALT: 15 U/L (ref 0–35)
AST: 13 U/L (ref 0–37)
Albumin: 4.2 g/dL (ref 3.5–5.2)
BILIRUBIN DIRECT: 0 mg/dL (ref 0.0–0.3)
BILIRUBIN TOTAL: 0.2 mg/dL (ref 0.2–1.2)
TOTAL PROTEIN: 7.8 g/dL (ref 6.0–8.3)

## 2015-07-28 LAB — BASIC METABOLIC PANEL
BUN: 9 mg/dL (ref 6–23)
CHLORIDE: 105 meq/L (ref 96–112)
CO2: 26 mEq/L (ref 19–32)
Calcium: 9.7 mg/dL (ref 8.4–10.5)
Creatinine, Ser: 0.64 mg/dL (ref 0.40–1.20)
GFR: 136.23 mL/min (ref >60.00)
Glucose, Bld: 94 mg/dL (ref 70–99)
Potassium: 3.8 mEq/L (ref 3.5–5.1)
Sodium: 139 mEq/L (ref 135–145)

## 2015-07-28 LAB — VITAMIN D 25 HYDROXY (VIT D DEFICIENCY, FRACTURES): VITD: 9.23 ng/mL — ABNORMAL LOW (ref 30.00–100.00)

## 2015-07-28 LAB — TSH: TSH: 1.91 u[IU]/mL (ref 0.35–4.50)

## 2015-07-28 MED ORDER — VITAMIN D (ERGOCALCIFEROL) 1.25 MG (50000 UNIT) PO CAPS: 50000.0000 [IU] | ORAL_CAPSULE | ORAL | Status: DC

## 2015-10-10 ENCOUNTER — Ambulatory Visit (INDEPENDENT_AMBULATORY_CARE_PROVIDER_SITE_OTHER): Payer: Federal, State, Local not specified - PPO | Admitting: Physician Assistant

## 2015-10-10 ENCOUNTER — Ambulatory Visit (HOSPITAL_BASED_OUTPATIENT_CLINIC_OR_DEPARTMENT_OTHER)
Admission: RE | Admit: 2015-10-10 | Discharge: 2015-10-10 | Disposition: A | Discharge status: Home / Self Care | Payer: Federal, State, Local not specified - PPO | Source: Ambulatory Visit | Attending: Physician Assistant | Admitting: Physician Assistant

## 2015-10-10 ENCOUNTER — Ambulatory Visit: Payer: Self-pay | Admitting: Family Medicine

## 2015-10-10 ENCOUNTER — Encounter: Payer: Self-pay | Admitting: Physician Assistant

## 2015-10-10 VITALS — BP 118/78 | HR 100 | Temp 98.8°F | Resp 16 | Ht 63.0 in | Wt 204.8 lb

## 2015-10-10 DIAGNOSIS — R0989 Other specified symptoms and signs involving the circulatory and respiratory systems: Secondary | ICD-10-CM | POA: Insufficient documentation

## 2015-10-10 DIAGNOSIS — J208 Acute bronchitis due to other specified organisms: Secondary | ICD-10-CM

## 2015-10-10 DIAGNOSIS — R918 Other nonspecific abnormal finding of lung field: Secondary | ICD-10-CM | POA: Diagnosis not present

## 2015-10-10 DIAGNOSIS — J00 Acute nasopharyngitis [common cold]: Secondary | ICD-10-CM

## 2015-10-10 DIAGNOSIS — B9689 Other specified bacterial agents as the cause of diseases classified elsewhere: Secondary | ICD-10-CM

## 2015-10-10 DIAGNOSIS — R05 Cough: Secondary | ICD-10-CM | POA: Diagnosis present

## 2015-10-10 DIAGNOSIS — R509 Fever, unspecified: Secondary | ICD-10-CM | POA: Insufficient documentation

## 2015-10-10 MED ORDER — BENZONATATE 100 MG PO CAPS: 100.0000 mg | ORAL_CAPSULE | Freq: Two times a day (BID) | ORAL | Status: DC | PRN

## 2015-10-10 MED ORDER — ALBUTEROL SULFATE HFA 108 (90 BASE) MCG/ACT IN AERS: 2.0000 | INHALATION_SPRAY | Freq: Four times a day (QID) | RESPIRATORY_TRACT | Status: DC | PRN

## 2015-10-10 MED ORDER — IPRATROPIUM-ALBUTEROL 0.5-2.5 (3) MG/3ML IN SOLN
3.0000 mL | Freq: Once | RESPIRATORY_TRACT | Status: AC
  Administered 2015-10-10: 3 mL via RESPIRATORY_TRACT

## 2015-10-10 MED ORDER — AZITHROMYCIN 250 MG PO TABS: ORAL_TABLET | ORAL | Status: DC

## 2015-10-10 MED ORDER — IPRATROPIUM-ALBUTEROL 0.5-2.5 (3) MG/3ML IN SOLN: 3.0000 mL | Freq: Four times a day (QID) | RESPIRATORY_TRACT | Status: DC

## 2015-10-10 NOTE — Progress Notes (Signed)
Pre visit review using our clinic review tool, if applicable. No additional management support is needed unless otherwise documented below in the visit note/SLS  

## 2015-10-10 NOTE — Addendum Note (Signed)
Addended by: Rockwell Germany on: 10/10/2015 04:56 PM   Modules accepted: Orders

## 2015-10-10 NOTE — Addendum Note (Signed)
Addended by: Tasia Catchings on: 10/10/2015 04:32 PM   Modules accepted: Orders

## 2015-10-10 NOTE — Patient Instructions (Signed)
Please go downstairs for chest x-ray. I will call you with your results.  Take antibiotic (Azithromycin) as directed.  Increase fluids.  Get plenty of rest. Use Mucinex for congestion. Use Tessalon Perles and albuterol as directed. Take a daily probiotic (I recommend Align or Culturelle, but even Activia Yogurt may be beneficial).  A humidifier placed in the bedroom may offer some relief for a dry, scratchy throat of nasal irritation.  Read information below on acute bronchitis. Please call or return to clinic if symptoms are not improving.  Acute Bronchitis Bronchitis is when the airways that extend from the windpipe into the lungs get red, puffy, and painful (inflamed). Bronchitis often causes thick spit (mucus) to develop. This leads to a cough. A cough is the most common symptom of bronchitis. In acute bronchitis, the condition usually begins suddenly and goes away over time (usually in 2 weeks). Smoking, allergies, and asthma can make bronchitis worse. Repeated episodes of bronchitis may cause more lung problems.  HOME CARE  Rest.  Drink enough fluids to keep your pee (urine) clear or pale yellow (unless you need to limit fluids as told by your doctor).  Only take over-the-counter or prescription medicines as told by your doctor.  Avoid smoking and secondhand smoke. These can make bronchitis worse. If you are a smoker, think about using nicotine gum or skin patches. Quitting smoking will help your lungs heal faster.  Reduce the chance of getting bronchitis again by:  Washing your hands often.  Avoiding people with cold symptoms.  Trying not to touch your hands to your mouth, nose, or eyes.  Follow up with your doctor as told.  GET HELP IF: Your symptoms do not improve after 1 week of treatment. Symptoms include:  Cough.  Fever.  Coughing up thick spit.  Body aches.  Chest congestion.  Chills.  Shortness of breath.  Sore throat.  GET HELP RIGHT AWAY IF:   You have  an increased fever.  You have chills.  You have severe shortness of breath.  You have bloody thick spit (sputum).  You throw up (vomit) often.  You lose too much body fluid (dehydration).  You have a severe headache.  You faint.  MAKE SURE YOU:   Understand these instructions.  Will watch your condition.  Will get help right away if you are not doing well or get worse. Document Released: 05/21/2008 Document Revised: 08/05/2013 Document Reviewed: 05/26/2013 [facility] Patient Information 2015 [place], [place]. This information is not intended to replace advice given to you by your health care provider. Make sure you discuss any questions you have with your health care provider.

## 2015-10-10 NOTE — Progress Notes (Signed)
Patient presents to clinic today c/o fever, chills, sore throat and yellow cough over the past week, worsening acutely over the past 3 days. Endorses chest tenderness with cough. Endorses some chest tightness. Denies recent travel or sick contact.  Past Medical History  Diagnosis Date  . Chicken pox   . Obesity   . Hyperlipidemia   . Anxiety   . PONV (postoperative nausea and vomiting)   . Dysrhythmia     palpitations with pregnancy  . Asthma     no meds needed, maybe 1-2 times per year  . GERD (gastroesophageal reflux disease)     no meds    Current Outpatient Prescriptions on File Prior to Visit  Medication Sig Dispense Refill  . Vitamin D, Ergocalciferol, (DRISDOL) 50000 UNITS CAPS capsule Take 1 capsule (50,000 Units total) by mouth every 7 (seven) days. 4 capsule 2   No current facility-administered medications on file prior to visit.    No Known Allergies  Family History  Problem Relation Age of Onset  . Arthritis Mother   . ALS Father   . Breast cancer Maternal Aunt   . Lung cancer Maternal Uncle   . Diabetes Paternal Aunt   . Arthritis Maternal Grandmother   . Breast cancer Maternal Grandmother   . Heart disease Paternal Grandmother   . Hypertension Paternal Grandmother   . Diabetes Paternal Grandfather     Social History   Social History  . Marital Status: Single    Spouse Name: N/A  . Number of Children: N/A  . Years of Education: N/A   Social History Main Topics  . Smoking status: Never Smoker   . Smokeless tobacco: Never Used  . Alcohol Use: No  . Drug Use: No  . Sexual Activity: No   Other Topics Concern  . None   Social History Narrative   Review of Systems - See HPI.  All other ROS are negative.  BP 118/78 mmHg  Pulse 100  Temp(Src) 98.8 F (37.1 C) (Oral)  Resp 16  Ht 5\' 3"  (1.6 m)  Wt 204 lb 12.8 oz (92.897 kg)  BMI 36.29 kg/m2  SpO2 97%  LMP 09/26/2015  Physical Exam  Constitutional: She is oriented to person, place,  and time and well-developed, well-nourished, and in no distress.  HENT:  Head: Normocephalic and atraumatic.  Right Ear: External ear normal.  Left Ear: External ear normal.  Nose: Nose normal.  Mouth/Throat: Oropharynx is clear and moist. No oropharyngeal exudate.  TM within normal limits bilaterally.  Eyes: Conjunctivae are normal.  Neck: Neck supple.  Cardiovascular: Normal rate, regular rhythm, normal heart sounds and intact distal pulses.   Pulmonary/Chest: Effort normal. No respiratory distress. She has wheezes. She has no rales. She exhibits no tenderness.  Neurological: She is alert and oriented to person, place, and time.  Skin: Skin is warm and dry. No rash noted.  Psychiatric: Affect normal.  Vitals reviewed.  Recent Results (from the past 2160 hour(s))  Lipid panel     Status: Abnormal   Collection Time: 07/27/15  4:16 PM  Result Value Ref Range   Cholesterol 190 0 - 200 mg/dL    Comment: ATP III Classification       Desirable:  < 200 mg/dL               Borderline High:  200 - 239 mg/dL          High:  > = 240 mg/dL   Triglycerides 53.0 0.0 -  149.0 mg/dL    Comment: Normal:  <150 mg/dLBorderline High:  150 - 199 mg/dL   HDL 41.80 >39.00 mg/dL   VLDL 10.6 0.0 - 40.0 mg/dL   LDL Cholesterol 137 (H) 0 - 99 mg/dL   Total CHOL/HDL Ratio 5     Comment:                Men          Women1/2 Average Risk     3.4          3.3Average Risk          5.0          4.42X Average Risk          9.6          7.13X Average Risk          15.0          11.0                       NonHDL 147.77     Comment: NOTE:  Non-HDL goal should be 30 mg/dL higher than patient's LDL goal (i.e. LDL goal of < 70 mg/dL, would have non-HDL goal of < 100 mg/dL)  Basic metabolic panel     Status: None   Collection Time: 07/27/15  4:16 PM  Result Value Ref Range   Sodium 139 135 - 145 mEq/L   Potassium 3.8 3.5 - 5.1 mEq/L   Chloride 105 96 - 112 mEq/L   CO2 26 19 - 32 mEq/L   Glucose, Bld 94 70 - 99 mg/dL     BUN 9 6 - 23 mg/dL   Creatinine, Ser 0.64 0.40 - 1.20 mg/dL   Calcium 9.7 8.4 - 10.5 mg/dL   GFR 136.23 >60.00 mL/min  TSH     Status: None   Collection Time: 07/27/15  4:16 PM  Result Value Ref Range   TSH 1.91 0.35 - 4.50 uIU/mL  Hepatic function panel     Status: Abnormal   Collection Time: 07/27/15  4:16 PM  Result Value Ref Range   Total Bilirubin 0.2 0.2 - 1.2 mg/dL   Bilirubin, Direct 0.0 0.0 - 0.3 mg/dL   Alkaline Phosphatase 119 (H) 39 - 117 U/L   AST 13 0 - 37 U/L   ALT 15 0 - 35 U/L   Total Protein 7.8 6.0 - 8.3 g/dL   Albumin 4.2 3.5 - 5.2 g/dL  CBC with Differential/Platelet     Status: Abnormal   Collection Time: 07/27/15  4:16 PM  Result Value Ref Range   WBC 8.6 4.0 - 10.5 K/uL   RBC 4.58 3.87 - 5.11 Mil/uL   Hemoglobin 11.8 (L) 12.0 - 15.0 g/dL   HCT 36.5 36.0 - 46.0 %   MCV 79.7 78.0 - 100.0 fl   MCHC 32.4 30.0 - 36.0 g/dL   RDW 14.1 11.5 - 15.5 %   Platelets 344.0 150.0 - 400.0 K/uL   Neutrophils Relative % 63.5 43.0 - 77.0 %   Lymphocytes Relative 27.5 12.0 - 46.0 %   Monocytes Relative 7.2 3.0 - 12.0 %   Eosinophils Relative 1.4 0.0 - 5.0 %   Basophils Relative 0.4 0.0 - 3.0 %   Neutro Abs 5.4 1.4 - 7.7 K/uL   Lymphs Abs 2.4 0.7 - 4.0 K/uL   Monocytes Absolute 0.6 0.1 - 1.0 K/uL   Eosinophils Absolute 0.1 0.0 - 0.7 K/uL   Basophils Absolute  0.0 0.0 - 0.1 K/uL  Vit D  25 hydroxy (rtn osteoporosis monitoring)     Status: Abnormal   Collection Time: 07/27/15  4:16 PM  Result Value Ref Range   VITD 9.23 (L) 30.00 - 100.00 ng/mL   Assessment/Plan: Acute bacterial bronchitis Duoneb given in office with improvement in wheezing. Will obtain CXR today. Rx Azithromycin.  Increase fluids.  Rest.  Saline nasal spray.  Probiotic.  Mucinex as directed.  Humidifier in bedroom. Tessalon and albuterol Rx given.  Call or return to clinic if symptoms are not improving.

## 2015-10-10 NOTE — Assessment & Plan Note (Signed)
Duoneb given in office with improvement in wheezing. Will obtain CXR today. Rx Azithromycin.  Increase fluids.  Rest.  Saline nasal spray.  Probiotic.  Mucinex as directed.  Humidifier in bedroom. Tessalon and albuterol Rx given.  Call or return to clinic if symptoms are not improving.

## 2015-10-14 ENCOUNTER — Telehealth: Payer: Self-pay | Admitting: Family Medicine

## 2015-10-14 ENCOUNTER — Encounter: Payer: Self-pay | Admitting: Physician Assistant

## 2015-10-14 NOTE — Telephone Encounter (Signed)
Caller name: Savannah Henry   Relationship to patient: Self   Can be reached: [phone]  Reason for call: Pt would like to know if she can have a work note for being out with pneumonia for the week. Pt says that she was out from Monday until today (Friday). She also have a fu appt with Tabori on Monday 10/31 at 2:00, she would like to know that can be included in the note to cover her getting off early to come in to her appt.

## 2015-10-14 NOTE — Telephone Encounter (Signed)
All done. She can come pick up.

## 2015-10-14 NOTE — Telephone Encounter (Signed)
Sorry, I meant to include that. She would like to come in and pick it up today, if possible .   Thanks.

## 2015-10-14 NOTE — Telephone Encounter (Signed)
Will write note for the week and to cover her leaving early on Monday for appointment. Would she like to pick it up, have [place] fax it or send it to her MyChart?

## 2015-10-14 NOTE — Telephone Encounter (Signed)
Called pt for pick up, she will be in today.     Thanks.

## 2015-10-17 ENCOUNTER — Encounter: Payer: Self-pay | Admitting: Family Medicine

## 2015-10-17 ENCOUNTER — Ambulatory Visit (INDEPENDENT_AMBULATORY_CARE_PROVIDER_SITE_OTHER): Payer: Federal, State, Local not specified - PPO | Admitting: Family Medicine

## 2015-10-17 VITALS — BP 120/80 | HR 96 | Temp 98.6°F | Resp 16 | Ht 63.0 in | Wt 205.5 lb

## 2015-10-17 DIAGNOSIS — J189 Pneumonia, unspecified organism: Secondary | ICD-10-CM | POA: Diagnosis not present

## 2015-10-17 MED ORDER — PROMETHAZINE-DM 6.25-15 MG/5ML PO SYRP: 5.0000 mL | ORAL_SOLUTION | Freq: Four times a day (QID) | ORAL | Status: DC | PRN

## 2015-10-17 MED ORDER — AMOXICILLIN-POT CLAVULANATE 875-125 MG PO TABS
1.0000 | ORAL_TABLET | Freq: Two times a day (BID) | ORAL | Status: DC
Start: 2015-10-17 — End: 2016-05-23

## 2015-10-17 NOTE — Progress Notes (Signed)
   Subjective:    Patient ID: Savannah Henry, female    DOB: 09/27/1981, 34 y.o.   MRN: [id number]  HPI PNA- pt was seen 10/24 and dx'd w/ PNA after CXR.  tx'd w/ Zpack.  Pt continues to have night sweats despite finishing abx.  + cough.  No ear pain.  + fatigue.  + sick contacts.   Review of Systems For ROS see HPI     Objective:   Physical Exam  Constitutional: She appears well-developed and well-nourished. No distress.  HENT:  Head: Normocephalic and atraumatic.  TMs normal bilaterally Mild nasal congestion Throat w/out erythema, edema, or exudate  Eyes: Conjunctivae and EOM are normal. Pupils are equal, round, and reactive to light.  Neck: Normal range of motion. Neck supple.  Cardiovascular: Normal rate, regular rhythm, normal heart sounds and intact distal pulses.   No murmur heard. Pulmonary/Chest: Effort normal and breath sounds normal. No respiratory distress. She has no wheezes.  + hacking cough  Lymphadenopathy:    She has no cervical adenopathy.  Vitals reviewed.         Assessment & Plan:

## 2015-10-17 NOTE — Patient Instructions (Addendum)
Follow up as needed Drink plenty of fluids REST! Start the Augmentin twice daily- take w/ food Use the cough syrup for nights/weekends- breastfeed first and then take the medication Mucinex DM for daytime cough Call with any questions or concerns Hang in there!!!

## 2015-10-17 NOTE — Progress Notes (Signed)
Pre visit review using our clinic review tool, if applicable. No additional management support is needed unless otherwise documented below in the visit note. 

## 2015-10-18 NOTE — Assessment & Plan Note (Signed)
New to provider.  Pt completed Zpack but continues to have night sweats indicative of fever.  Too early to repeat cxr to assess for radiologic resolution- will change abx and cough meds due to concern for persistent PNA.  Reviewed supportive care and red flags that should prompt return.  Pt expressed understanding and is in agreement w/ plan.

## 2015-12-18 HISTORY — PX: DILATION AND CURETTAGE OF UTERUS: SHX78

## 2015-12-20 ENCOUNTER — Other Ambulatory Visit: Payer: Self-pay | Admitting: Obstetrics and Gynecology

## 2016-05-23 ENCOUNTER — Encounter: Payer: Self-pay | Admitting: Medical

## 2016-05-23 ENCOUNTER — Ambulatory Visit (INDEPENDENT_AMBULATORY_CARE_PROVIDER_SITE_OTHER): Payer: Federal, State, Local not specified - PPO | Admitting: Medical

## 2016-05-23 VITALS — BP 126/80 | HR 88 | Temp 98.2°F | Resp 16 | Ht 63.0 in | Wt 213.8 lb

## 2016-05-23 DIAGNOSIS — R002 Palpitations: Secondary | ICD-10-CM

## 2016-05-23 DIAGNOSIS — R21 Rash and other nonspecific skin eruption: Secondary | ICD-10-CM | POA: Diagnosis not present

## 2016-05-23 DIAGNOSIS — R5383 Other fatigue: Secondary | ICD-10-CM

## 2016-05-23 DIAGNOSIS — M25522 Pain in left elbow: Secondary | ICD-10-CM

## 2016-05-23 DIAGNOSIS — R42 Dizziness and giddiness: Secondary | ICD-10-CM

## 2016-05-23 LAB — COMPLETE METABOLIC PANEL WITH GFR
ALBUMIN: 4.2 g/dL (ref 3.6–5.1)
ALT: 20 U/L (ref 6–29)
AST: 19 U/L (ref 10–30)
Alkaline Phosphatase: 95 U/L (ref 33–115)
BILIRUBIN TOTAL: 0.3 mg/dL (ref 0.2–1.2)
BUN: 7 mg/dL (ref 7–25)
CALCIUM: 8.9 mg/dL (ref 8.6–10.2)
CO2: 23 mmol/L (ref 20–31)
CREATININE: 0.58 mg/dL (ref 0.50–1.10)
Chloride: 103 mmol/L (ref 98–110)
GFR, Est African American: >89 mL/min (ref >=60)
GFR, Est Non African American: >89 mL/min (ref >=60)
GLUCOSE: 100 mg/dL — AB (ref 65–99)
Potassium: 4.4 mmol/L (ref 3.5–5.3)
Sodium: 138 mmol/L (ref 135–146)
Total Protein: 6.8 g/dL (ref 6.1–8.1)

## 2016-05-23 MED ORDER — TRIAMCINOLONE ACETONIDE 0.1 % EX CREA: 1.0000 "application " | TOPICAL_CREAM | Freq: Two times a day (BID) | CUTANEOUS | Status: DC

## 2016-05-23 MED ORDER — MECLIZINE HCL 12.5 MG PO TABS: 12.5000 mg | ORAL_TABLET | Freq: Three times a day (TID) | ORAL | Status: DC | PRN

## 2016-05-23 NOTE — Patient Instructions (Addendum)
For your palpitations we did ekg today to get baseline ekg. No symptoms recently but if you do have recurrent type symptoms then can compare to study done today. But if you have severe palpitations or cardiac symptoms then ED evaluation.  You mention mild fatigue. Will get labs to check if anemia, thyroid abnormality or electrolyte abnormality.  For elbow pain will get xray of elbow, uric acid and set rate.   For rash will rx triamcinolone and advise use moisturizer as well twice daily. If any  redness or increasing warmth notify us.  For light headed sensation will rx low dose meclizine. Use just for one day. Also will give work note for one day.  If any ha with other neurologic signs or symptoms then ED evaluation. If by late Friday morning labs negative and light head symptoms same may consider ct imaging study of head. By neurologic exam today and lack of associated signs and symptoms presently don't think imaging indicated.  Follow up by Monday am by phone or as needed. We will call you on labs and advise on official follow up in office follow up  visit

## 2016-05-23 NOTE — Progress Notes (Signed)
Pre visit review using our clinic review tool, if applicable. No additional management support is needed unless otherwise documented below in the visit note. 

## 2016-05-23 NOTE — Progress Notes (Signed)
Subjective:    Patient ID: Savannah Henry, female    DOB: Oct 04, 1981, 35 y.o.   MRN: [id number]  HPI  Pt in reporting couple of hours ago. She felt light headed sensation. Pt feels slight stuffy head sensation. But denies nasal congestion. No sneezing or cough. Pt states when it first came on was worse. Only describes fuzzy sensation to head. Very weird sensation. No ha. No nausea, no vomiting. No vision changes. No confusion. Pt bp is good. LMP- Pt lmp 3 wks ago. Was normal and came on when she thought it should. No chills sweats or fever. No chest pain or palpitations.  Pt had days of elbow pain. When she moved elbow more pain increased. Today/this am no pain in elbow but got rash to elbow this am. Rash does not itch. Pt thought area of elbow felt warm today but no pain. No pain in upper arm.   Palpitations- she states 3 weeks ago felt like she could not breath. She was at work. Coworkers were about to call 911 and she declined. She left work. Went home and got home she rested and symptoms resolved. Pt resting heart rate with coworker apple device was 115.   No leg pain.    Review of Systems  Constitutional: Positive for fatigue. Negative for fever and chills.  Skin: Negative for rash.  Neurological: Negative for dizziness, numbness and headaches.       Light headed sensation.  Psychiatric/Behavioral: Negative for suicidal ideas, hallucinations, behavioral problems, confusion, decreased concentration and agitation. The patient is not nervous/anxious.        Pt states not stressed. None at all per her report.     Past Medical History  Diagnosis Date  . Chicken pox   . Obesity   . Hyperlipidemia   . Anxiety   . PONV (postoperative nausea and vomiting)   . Dysrhythmia     palpitations with pregnancy  . Asthma     no meds needed, maybe 1-2 times per year  . GERD (gastroesophageal reflux disease)     no meds     Social History   Social History  . Marital Status: Single   Spouse Name: N/A  . Number of Children: N/A  . Years of Education: N/A   Occupational History  . Not on file.   Social History Main Topics  . Smoking status: Never Smoker   . Smokeless tobacco: Never Used  . Alcohol Use: No  . Drug Use: No  . Sexual Activity: No   Other Topics Concern  . Not on file   Social History Narrative    Past Surgical History  Procedure Laterality Date  . Cholecystectomy  2007  . Cesarean section  2005  . Cesarean section N/A 08/06/2013    Procedure: REPEAT CESAREAN SECTION;  Surgeon: Shelly Bombard, MD;  Location: [facility];  Service: Obstetrics;  Laterality: N/A;  . Excision metacarpal mass Right 09/21/2013    Procedure: RIGHT PALM EXCISION MASS;  Surgeon: Tennis Must, MD;  Location: [facility];  Service: Orthopedics;  Laterality: Right;    Family History  Problem Relation Age of Onset  . Arthritis Mother   . ALS Father   . Breast cancer Maternal Aunt   . Lung cancer Maternal Uncle   . Diabetes Paternal Aunt   . Arthritis Maternal Grandmother   . Breast cancer Maternal Grandmother   . Heart disease Paternal Grandmother   . Hypertension Paternal Grandmother   . Diabetes  Paternal Grandfather     No Known Allergies  Current Outpatient Prescriptions on File Prior to Visit  Medication Sig Dispense Refill  . albuterol (PROVENTIL HFA;VENTOLIN HFA) 108 (90 BASE) MCG/ACT inhaler Inhale 2 puffs into the lungs every 6 (six) hours as needed for wheezing or shortness of breath. 1 Inhaler 0   No current facility-administered medications on file prior to visit.    BP 126/80 mmHg  Pulse 88  Temp(Src) 98.2 F (36.8 C) (Oral)  Resp 16  Ht 5\' 3"  (1.6 m)  Wt 213 lb 12.8 oz (96.979 kg)  BMI 37.88 kg/m2  SpO2 98%  LMP 04/30/2016       Objective:   Physical Exam  General Mental Status- Alert. General Appearance- Not in acute distress.   Skin General: Color- Normal Color. Moisture- Normal Moisture.  Neck Carotid Arteries-  Normal color. Moisture- Normal Moisture. No carotid bruits. No JVD.  Chest and Lung Exam Auscultation: Breath Sounds:-Normal.  Cardiovascular Auscultation:Rythm- Regular. Murmurs & Other Heart Sounds:Auscultation of the heart reveals- No Murmurs.  Abdomen Inspection:-Inspeection Normal. Palpation/Percussion:Note:No mass. Palpation and Percussion of the abdomen reveal- Non Tender, Non Distended + BS, no rebound or guarding.   Neurologic Cranial Nerve exam:- CN III-XII intact(No nystagmus), symmetric smile. Drift Test:- No drift. Romberg Exam:- Negative.  Heal to Toe Gait exam:-Normal. Finger to Nose:- Normal/Intact Strength:- 5/5 equal and symmetric strength both upper and lower extremities.  Left upper ext- no swelling of arm. No bruits heard brachial artery region. No pain on palpation. Left elbow- good rom. No pain on palpation. No warmth. Skin- left elbow faint scattered papular rash. No vesicles seen.  Lower ext- no pedal edema, calfs symmetric, neg homans signs.     Assessment & Plan:  ekg nsr  For your palpitations we did ekg today to get baseline ekg. No symptoms recently but if you do have recurrent type symptoms then can compare to study done today. But if you have severe palpitations or cardiac symptoms then ED evaluation.  You mention mild fatigue. Will get labs to check if anemia, thyroid abnormality or electrolyte abnormality.  For elbow pain will get xray of elbow, uric acid and set rate.   For rash will rx triamcinolone and advise use moisturizer as well twice daily. If any  redness or increasing warmth notify us.  For light headed sensation will rx low dose meclizine. Use just for one day. Also will give work note for one day.  If any ha with other neurologic signs or symptoms then ED evaluation. If by late Friday morning labs negative and light head symptoms same may consider ct imaging study of head. By neurologic exam today and lack of associated signs  and symptoms presently don't think imaging indicated.  Follow up by Monday am by phone or as needed. We will call you on labs and advise on official follow up in office follow up  visit

## 2016-05-24 LAB — CBC WITH DIFFERENTIAL/PLATELET
BASOS PCT: 0.5 % (ref 0.0–3.0)
Basophils Absolute: 0 10*3/uL (ref 0.0–0.1)
EOS PCT: 1.6 % (ref 0.0–5.0)
Eosinophils Absolute: 0.1 10*3/uL (ref 0.0–0.7)
HCT: 36.4 % (ref 36.0–46.0)
HEMOGLOBIN: 11.7 g/dL — AB (ref 12.0–15.0)
Lymphocytes Relative: 24.3 % (ref 12.0–46.0)
Lymphs Abs: 2.1 10*3/uL (ref 0.7–4.0)
MCHC: 32 g/dL (ref 30.0–36.0)
MCV: 79.9 fl (ref 78.0–100.0)
MONO ABS: 0.6 10*3/uL (ref 0.1–1.0)
Monocytes Relative: 7.3 % (ref 3.0–12.0)
NEUTROS ABS: 5.7 10*3/uL (ref 1.4–7.7)
Neutrophils Relative %: 66.3 % (ref 43.0–77.0)
PLATELETS: 330 10*3/uL (ref 150.0–400.0)
RBC: 4.56 Mil/uL (ref 3.87–5.11)
RDW: 14.5 % (ref 11.5–15.5)
WBC: 8.7 10*3/uL (ref 4.0–10.5)

## 2016-05-24 LAB — SEDIMENTATION RATE: Sed Rate: 13 mm/hr (ref 0–20)

## 2016-05-24 LAB — URIC ACID: Uric Acid, Serum: 6 mg/dL (ref 2.4–7.0)

## 2016-05-24 LAB — TSH: TSH: 1.13 u[IU]/mL (ref 0.35–4.50)

## 2016-05-24 NOTE — Progress Notes (Signed)
Quick Note:  Pt has seen results on MyChart and message also sent for patient to call back if any questions. ______ 

## 2016-10-29 ENCOUNTER — Encounter: Payer: Self-pay | Admitting: Family Medicine

## 2016-10-29 ENCOUNTER — Ambulatory Visit (INDEPENDENT_AMBULATORY_CARE_PROVIDER_SITE_OTHER): Payer: Federal, State, Local not specified - PPO | Admitting: Family Medicine

## 2016-10-29 VITALS — BP 110/62 | HR 113 | Temp 98.9°F | Ht 63.0 in | Wt 210.4 lb

## 2016-10-29 DIAGNOSIS — J069 Acute upper respiratory infection, unspecified: Secondary | ICD-10-CM | POA: Diagnosis not present

## 2016-10-29 DIAGNOSIS — N3001 Acute cystitis with hematuria: Secondary | ICD-10-CM

## 2016-10-29 DIAGNOSIS — H6123 Impacted cerumen, bilateral: Secondary | ICD-10-CM

## 2016-10-29 LAB — POC URINALSYSI DIPSTICK (AUTOMATED)
BILIRUBIN UA: NEGATIVE
GLUCOSE UA: NEGATIVE
KETONES UA: NEGATIVE
Nitrite, UA: NEGATIVE
Urobilinogen, UA: NEGATIVE
pH, UA: 5.5

## 2016-10-29 MED ORDER — SULFAMETHOXAZOLE-TRIMETHOPRIM 800-160 MG PO TABS: 1.0000 | ORAL_TABLET | Freq: Two times a day (BID) | ORAL | 0 refills | Status: DC

## 2016-10-29 NOTE — Addendum Note (Signed)
Addended by: [place]ellar on: 10/29/2016 04:28 PM   Modules accepted: Orders

## 2016-10-29 NOTE — Patient Instructions (Signed)
Claritin (loratadine), Allegra (fexofenadine), Zyrtec (cetirizine); these are listed in order from weakest to strongest. Generic, and therefore cheaper, options are in the parentheses.   Flonase (fluticasone); nasal spray that is over the counter. 2 sprays each nostril, once daily. Aim towards the same side eye when you spray.  There are available OTC, and the generic versions, which may be cheaper, are in parentheses. Show this to a pharmacist if you have trouble finding any of these items.  No sex over the next 3 days.

## 2016-10-29 NOTE — Progress Notes (Signed)
Chief Complaint  Patient presents with  . Urinary Tract Infection    Pt states she has felt burning sensation after urination and dark urine  . Nasal Congestion    Pt states she has had cough congestion and sore throat    Savannah Henry is a 35 y.o. female here for possible UTI.  Duration: 1 week. Symptoms: hematuria, pain and increased frequency Denies: fevers, abdominal pain, vaginal discharge Hx of recurrent UTI? No Denies new sexual partners.  Nasal congestion, runny nose, sore throat and cough for past 2 days as well. No sick contacts, therapies to date or fevers. Ears are painful as well.  ROS:  Constitutional: denies fever GU: As noted in HPI MSK: Denies back pain Abd: Denies constipation or abdominal pain  Past Medical History:  Diagnosis Date  . Anxiety   . Asthma    no meds needed, maybe 1-2 times per year  . Chicken pox   . Dysrhythmia    palpitations with pregnancy  . GERD (gastroesophageal reflux disease)    no meds  . Hyperlipidemia   . Obesity   . PONV (postoperative nausea and vomiting)    Family History  Problem Relation Age of Onset  . Arthritis Mother   . ALS Father   . Breast cancer Maternal Aunt   . Lung cancer Maternal Uncle   . Diabetes Paternal Aunt   . Arthritis Maternal Grandmother   . Breast cancer Maternal Grandmother   . Heart disease Paternal Grandmother   . Hypertension Paternal Grandmother   . Diabetes Paternal Grandfather    Social History   Social History  . Marital status: Single   Social History Main Topics  . Smoking status: Never Smoker  . Smokeless tobacco: Never Used  . Alcohol use No  . Drug use: No  . Sexual activity: No    BP 110/62 (BP Location: Left Arm, Patient Position: Sitting, Cuff Size: Large)   Pulse (!) 113   Temp 98.9 F (37.2 C) (Oral)   Ht 5\' 3"  (1.6 m)   Wt 210 lb 6.4 oz (95.4 kg)   LMP 10/09/2016   SpO2 98%   BMI 37.27 kg/m  General: Awake, alert, appears stated age 87: MMM, pharynx  without erythema or exudate, no sinus tenderness, b/l turbinates very swollen, ears 100% occluded with cerumen b/l Heart: RRR, no murmurs Lungs: CTAB, normal respiratory effort, no accessory muscle usage Abd: BS+, soft, NT, ND, no masses or organomegaly MSK: No CVA tenderness, neg Lloyd's sign Psych: Age appropriate judgment and insight  Acute cystitis with hematuria - Plan: sulfamethoxazole-trimethoprim (BACTRIM DS,SEPTRA DS) 800-160 MG tablet  URI with cough and congestion  Orders as above. UA suggestive of infection. Flonase for cold.  Debrox and gentle irrigation for ear wax. F/u prn. The patient voiced understanding and agreement to the plan.  [place], DO 10/29/16 3:35 PM

## 2016-10-29 NOTE — Progress Notes (Signed)
Pre visit review using our clinic review tool, if applicable. No additional management support is needed unless otherwise documented below in the visit note. 

## 2016-11-03 ENCOUNTER — Ambulatory Visit (INDEPENDENT_AMBULATORY_CARE_PROVIDER_SITE_OTHER): Payer: Federal, State, Local not specified - PPO | Admitting: Family Medicine

## 2016-11-03 ENCOUNTER — Other Ambulatory Visit (HOSPITAL_COMMUNITY)
Admission: RE | Admit: 2016-11-03 | Discharge: 2016-11-03 | Disposition: A | Discharge status: Home / Self Care | Payer: Federal, State, Local not specified - PPO | Source: Other Acute Inpatient Hospital | Attending: Family Medicine | Admitting: Family Medicine

## 2016-11-03 ENCOUNTER — Encounter: Payer: Self-pay | Admitting: Family Medicine

## 2016-11-03 VITALS — BP 118/80 | HR 77 | Temp 98.1°F | Resp 18 | Ht 63.0 in | Wt 207.5 lb

## 2016-11-03 DIAGNOSIS — R350 Frequency of micturition: Secondary | ICD-10-CM

## 2016-11-03 DIAGNOSIS — N898 Other specified noninflammatory disorders of vagina: Secondary | ICD-10-CM

## 2016-11-03 LAB — POCT URINALYSIS DIP (MANUAL ENTRY)
BILIRUBIN UA: NEGATIVE
BILIRUBIN UA: NEGATIVE
Blood, UA: NEGATIVE
GLUCOSE UA: NEGATIVE
Nitrite, UA: NEGATIVE
Protein Ur, POC: NEGATIVE
Urobilinogen, UA: 0.2
pH, UA: 6

## 2016-11-03 MED ORDER — FLUCONAZOLE 150 MG PO TABS: 150.0000 mg | ORAL_TABLET | Freq: Once | ORAL | 0 refills | Status: AC

## 2016-11-03 NOTE — Addendum Note (Signed)
Addended by: Leeanne Rio on: 11/03/2016 12:34 PM   Modules accepted: Orders

## 2016-11-03 NOTE — Progress Notes (Signed)
   Subjective:    Patient ID: Savannah Henry, female    DOB: 07-20-1981, 35 y.o.   MRN: [id number]  HPI Vaginal itching and UTI sxs- pt was tx'd w/ 3 days of Bactrim on 11/13.  Pt developed vaginal itching and irritation after taking the bactrim.  Had similar sxs last month prior to her period but sxs resolved after menses.  Then developed strictly urinary sxs w/o any itching.  Denies urinary frequency.  No hematuria, suprapubic pressure, urgency.  Grey vaginal d/c but no odor.   Review of Systems For ROS see HPI     Objective:   Physical Exam  Constitutional: She is oriented to person, place, and time. She appears well-developed and well-nourished. No distress.  HENT:  Head: Normocephalic and atraumatic.  Abdominal: Soft. She exhibits no distension. There is no tenderness (no suprapubic or CVA tenderness).  Genitourinary:  Genitourinary Comments: Pt declined today  Neurological: She is alert and oriented to person, place, and time.  Skin: Skin is warm and dry.  Psychiatric: She has a normal mood and affect. Her behavior is normal. Thought content normal.  Vitals reviewed.         Assessment & Plan:  Vaginal irritation- since this occurred in the setting of menses last month and after abx this month, I am highly suspicious of yeast vaginitis.  Start Diflucan.  Urinary sxs have resolved.  If no improvement after empiric diflucan, will need return OV for wet prep and formal testing.  Pt expressed understanding and is in agreement w/ plan.

## 2016-11-03 NOTE — Patient Instructions (Signed)
Follow up as needed- particularly if symptoms don't improve Take 1 tab of the Diflucan today and then repeat in 3 days if needed Drink plenty of fluids Call with any questions or concerns Hang in there! Happy Holidays!!!

## 2016-11-03 NOTE — Progress Notes (Signed)
Pre-visit discussion using our clinic review tool. No additional management support is needed unless otherwise documented below in the visit note.  

## 2016-11-04 LAB — URINE CULTURE

## 2016-12-03 ENCOUNTER — Encounter: Payer: Self-pay | Admitting: Family

## 2016-12-03 ENCOUNTER — Ambulatory Visit (INDEPENDENT_AMBULATORY_CARE_PROVIDER_SITE_OTHER): Payer: Federal, State, Local not specified - PPO | Admitting: Family

## 2016-12-03 ENCOUNTER — Telehealth: Payer: Self-pay | Admitting: Family Medicine

## 2016-12-03 VITALS — BP 117/80 | HR 89 | Temp 99.2°F | Resp 16 | Ht 63.0 in | Wt 209.6 lb

## 2016-12-03 DIAGNOSIS — H6123 Impacted cerumen, bilateral: Secondary | ICD-10-CM

## 2016-12-03 DIAGNOSIS — H6692 Otitis media, unspecified, left ear: Secondary | ICD-10-CM

## 2016-12-03 MED ORDER — AMOXICILLIN 500 MG PO CAPS: 500.0000 mg | ORAL_CAPSULE | Freq: Three times a day (TID) | ORAL | 0 refills | Status: DC

## 2016-12-03 NOTE — Telephone Encounter (Signed)
OK with me.

## 2016-12-03 NOTE — Telephone Encounter (Signed)
Patient would like to transfer from Dr. Birdie Riddle to Debbrah Alar, NP please advise

## 2016-12-03 NOTE — Progress Notes (Signed)
Subjective:    Patient ID: Savannah Henry, female    DOB: 1981/03/13, 35 y.o.   MRN: [id number]  HPI  Reports ears feel very clogged.  Has some chronic sinus congestion, but not out of the ordinary.    Review of Systems See HPI  Past Medical History:  Diagnosis Date  . Anxiety   . Asthma    no meds needed, maybe 1-2 times per year  . Chicken pox   . Dysrhythmia    palpitations with pregnancy  . GERD (gastroesophageal reflux disease)    no meds  . Hyperlipidemia   . Obesity   . PONV (postoperative nausea and vomiting)      Social History   Social History  . Marital status: Single    Spouse name: N/A  . Number of children: N/A  . Years of education: N/A   Occupational History  . Not on file.   Social History Main Topics  . Smoking status: Never Smoker  . Smokeless tobacco: Never Used  . Alcohol use No  . Drug use: No  . Sexual activity: No   Other Topics Concern  . Not on file   Social History Narrative  . No narrative on file    Past Surgical History:  Procedure Laterality Date  . CESAREAN SECTION  2005  . CESAREAN SECTION N/A 08/06/2013   Procedure: REPEAT CESAREAN SECTION;  Surgeon: Shelly Bombard, MD;  Location: [facility];  Service: Obstetrics;  Laterality: N/A;  . CHOLECYSTECTOMY  2007  . DILATION AND CURETTAGE OF UTERUS  12/18/2015   pt reported miscarriage  . EXCISION METACARPAL MASS Right 09/21/2013   Procedure: RIGHT PALM EXCISION MASS;  Surgeon: Tennis Must, MD;  Location: [facility];  Service: Orthopedics;  Laterality: Right;    Family History  Problem Relation Age of Onset  . Arthritis Mother   . ALS Father   . Breast cancer Maternal Aunt   . Lung cancer Maternal Uncle   . Diabetes Paternal Aunt   . Arthritis Maternal Grandmother   . Breast cancer Maternal Grandmother   . Heart disease Paternal Grandmother   . Hypertension Paternal Grandmother   . Diabetes Paternal Grandfather     No Known Allergies  Current  Outpatient Prescriptions on File Prior to Visit  Medication Sig Dispense Refill  . albuterol (PROVENTIL HFA;VENTOLIN HFA) 108 (90 BASE) MCG/ACT inhaler Inhale 2 puffs into the lungs every 6 (six) hours as needed for wheezing or shortness of breath. 1 Inhaler 0  . triamcinolone cream (KENALOG) 0.1 % Apply 1 application topically 2 (two) times daily. 30 g 0   No current facility-administered medications on file prior to visit.     BP 117/80 (BP Location: Right Arm, Cuff Size: Large)   Pulse 89   Temp 99.2 F (37.3 C) (Oral)   Resp 16   Ht 5\' 3"  (1.6 m)   Wt 209 lb 9.6 oz (95.1 kg)   LMP 11/05/2016   SpO2 99% Comment: room air  BMI 37.13 kg/m       Objective:   Physical Exam  Constitutional: She appears well-developed and well-nourished.  HENT:  Bilateral cerumen impaction (after cerumen removal normal R TM is revealed. L TM is dull and erythematous)  Cardiovascular: Normal rate, regular rhythm and normal heart sounds.   No murmur heard. Pulmonary/Chest: Effort normal and breath sounds normal. No respiratory distress. She has no wheezes.  Psychiatric: She has a normal mood and affect. Her behavior is  normal. Judgment and thought content normal.          Assessment & Plan:  Bilateral cerumen impaction- Ceruminosis is noted.  Wax is removed by syringing and manual debridement. Instructions for home care to prevent wax buildup are given.   Left otitis media- rx with amoxicillin.

## 2016-12-03 NOTE — Progress Notes (Signed)
Pre visit review using our clinic review tool, if applicable. No additional management support is needed unless otherwise documented below in the visit note. 

## 2016-12-03 NOTE — Patient Instructions (Addendum)
Please purchase over the counter ceruminex or Debrox (ear wax softening kit) and apply ear drops once weekly, followed by flushing with warm water using a bulb syringe to help keep wax buildup down.  Begin amoxicillin for your left ear infection.  Let us know if your symptoms worsen or if symptom are not improved in 1 week.

## 2016-12-04 NOTE — Telephone Encounter (Signed)
Ok w/ me 

## 2016-12-05 NOTE — Telephone Encounter (Signed)
Patient scheduled for 12/21/16 at 1pm.

## 2016-12-20 ENCOUNTER — Encounter: Payer: Self-pay | Admitting: Behavioral Health

## 2016-12-20 ENCOUNTER — Telehealth: Payer: Self-pay | Admitting: Behavioral Health

## 2016-12-20 NOTE — Telephone Encounter (Signed)
Pre-Visit Call completed with patient and chart updated.   Pre-Visit Info documented in Specialty Comments under [employer].    

## 2016-12-21 ENCOUNTER — Encounter: Payer: Self-pay | Admitting: Family

## 2016-12-21 ENCOUNTER — Encounter: Payer: Self-pay | Admitting: Gastroenterology

## 2016-12-21 ENCOUNTER — Other Ambulatory Visit (HOSPITAL_COMMUNITY)
Admission: RE | Admit: 2016-12-21 | Discharge: 2016-12-21 | Disposition: A | Discharge status: Home / Self Care | Payer: Federal, State, Local not specified - PPO | Source: Ambulatory Visit | Attending: Family | Admitting: Family

## 2016-12-21 ENCOUNTER — Ambulatory Visit (INDEPENDENT_AMBULATORY_CARE_PROVIDER_SITE_OTHER): Payer: Federal, State, Local not specified - PPO | Admitting: Family

## 2016-12-21 VITALS — BP 120/60 | HR 103 | Temp 99.1°F | Resp 16 | Ht 63.0 in | Wt 202.4 lb

## 2016-12-21 DIAGNOSIS — K580 Irritable bowel syndrome with diarrhea: Secondary | ICD-10-CM

## 2016-12-21 DIAGNOSIS — F411 Generalized anxiety disorder: Secondary | ICD-10-CM | POA: Diagnosis not present

## 2016-12-21 DIAGNOSIS — Z1151 Encounter for screening for human papillomavirus (HPV): Secondary | ICD-10-CM | POA: Diagnosis not present

## 2016-12-21 DIAGNOSIS — Z01419 Encounter for gynecological examination (general) (routine) without abnormal findings: Secondary | ICD-10-CM | POA: Insufficient documentation

## 2016-12-21 DIAGNOSIS — Z0000 Encounter for general adult medical examination without abnormal findings: Secondary | ICD-10-CM

## 2016-12-21 LAB — HEPATIC FUNCTION PANEL
ALT: 21 U/L (ref 0–35)
AST: 19 U/L (ref 0–37)
Albumin: 4.1 g/dL (ref 3.5–5.2)
Alkaline Phosphatase: 106 U/L (ref 39–117)
BILIRUBIN DIRECT: 0.1 mg/dL (ref 0.0–0.3)
BILIRUBIN TOTAL: 0.3 mg/dL (ref 0.2–1.2)
Total Protein: 7.4 g/dL (ref 6.0–8.3)

## 2016-12-21 LAB — CBC WITH DIFFERENTIAL/PLATELET
Basophils Absolute: 0 10*3/uL (ref 0.0–0.1)
Basophils Relative: 0.5 % (ref 0.0–3.0)
EOS ABS: 0.1 10*3/uL (ref 0.0–0.7)
Eosinophils Relative: 1.5 % (ref 0.0–5.0)
HCT: 36.7 % (ref 36.0–46.0)
Hemoglobin: 12.1 g/dL (ref 12.0–15.0)
Lymphocytes Relative: 26.3 % (ref 12.0–46.0)
Lymphs Abs: 1.4 10*3/uL (ref 0.7–4.0)
MCHC: 33 g/dL (ref 30.0–36.0)
MCV: 79.2 fl (ref 78.0–100.0)
MONO ABS: 0.6 10*3/uL (ref 0.1–1.0)
Monocytes Relative: 12 % (ref 3.0–12.0)
NEUTROS ABS: 3.1 10*3/uL (ref 1.4–7.7)
Neutrophils Relative %: 59.7 % (ref 43.0–77.0)
PLATELETS: 324 10*3/uL (ref 150.0–400.0)
RBC: 4.63 Mil/uL (ref 3.87–5.11)
RDW: 13.5 % (ref 11.5–15.5)
WBC: 5.2 10*3/uL (ref 4.0–10.5)

## 2016-12-21 LAB — URINALYSIS, ROUTINE W REFLEX MICROSCOPIC
Hgb urine dipstick: NEGATIVE
Ketones, ur: NEGATIVE
Leukocytes, UA: NEGATIVE
Nitrite: NEGATIVE
PH: 5.5 (ref 5.0–8.0)
TOTAL PROTEIN, URINE-UPE24: NEGATIVE
URINE GLUCOSE: NEGATIVE
Urobilinogen, UA: 0.2 (ref 0.0–1.0)

## 2016-12-21 LAB — LIPID PANEL
Cholesterol: 181 mg/dL (ref 0–200)
HDL: 33.5 mg/dL — AB (ref >39.00)
LDL CALC: 133 mg/dL — AB (ref 0–99)
NonHDL: 147.57
TRIGLYCERIDES: 72 mg/dL (ref 0.0–149.0)
Total CHOL/HDL Ratio: 5
VLDL: 14.4 mg/dL (ref 0.0–40.0)

## 2016-12-21 LAB — BASIC METABOLIC PANEL
BUN: 8 mg/dL (ref 6–23)
CALCIUM: 8.9 mg/dL (ref 8.4–10.5)
CHLORIDE: 104 meq/L (ref 96–112)
CO2: 28 mEq/L (ref 19–32)
CREATININE: 0.66 mg/dL (ref 0.40–1.20)
GFR: 130.42 mL/min (ref >60.00)
Glucose, Bld: 101 mg/dL — ABNORMAL HIGH (ref 70–99)
Potassium: 3.4 mEq/L — ABNORMAL LOW (ref 3.5–5.1)
Sodium: 138 mEq/L (ref 135–145)

## 2016-12-21 LAB — TSH: TSH: 0.88 u[IU]/mL (ref 0.35–4.50)

## 2016-12-21 MED ORDER — PRENATAL VITAMINS 0.8 MG PO TABS: 1.0000 | ORAL_TABLET | Freq: Every day | ORAL | Status: DC

## 2016-12-21 NOTE — Addendum Note (Signed)
Addended by: Kelle Darting A on: 12/21/2016 03:02 PM   Modules accepted: Orders

## 2016-12-21 NOTE — Progress Notes (Signed)
Subjective:    Patient ID: Savannah Henry, female    DOB: [birth year], 36 y.o.   MRN: [id number]  HPI  Patient presents today for complete physical.  Immunizations: declines flu shot.  Tetanus is up to date.  Diet: diet is "OK" needs to eat more fruits/veggies, less snacks [facility]:  [facility] Pap Smear: 3 yrs ago. Reports 15 years ago she had an abnormal pap.She reports previous hx of colposcopy 15 + yrs ago. Birth Control- not using , hoping to become pregnant.  Anxiety- notes occasional panic attacks. Reports that this happens  1-2 times a month.  Not sure what her triggers are.    Diarrhea- notes "I think I have IBS."  Will spend up to an hour a day in the bathroom with diarrhea. This has been going on for a long time.   Review of Systems  Constitutional: Negative for unexpected weight change.  HENT: Negative for rhinorrhea.        Reports + sensitivity left ear and has had normal hearing test  Eyes: Negative for visual disturbance.  Respiratory: Negative for cough and shortness of breath.   Cardiovascular: Negative for chest pain and leg swelling.  Gastrointestinal: Positive for diarrhea. Negative for blood in stool and constipation.  Genitourinary: Negative for dysuria, frequency and menstrual problem.  Musculoskeletal: Negative for arthralgias and myalgias.  Neurological: Negative for headaches.  Hematological: Negative for adenopathy.  Psychiatric/Behavioral:       Denies depression       Past Medical History:  Diagnosis Date  . Anxiety   . Asthma    no meds needed, maybe 1-2 times per year  . Chicken pox   . Dysrhythmia    palpitations with pregnancy  . GERD (gastroesophageal reflux disease)    no meds  . Hyperlipidemia   . Obesity   . PONV (postoperative nausea and vomiting)      Social History   Social History  . Marital status: Single    Spouse name: N/A  . Number of children: N/A  . Years of education: N/A   Occupational History  .  Not on file.   Social History Main Topics  . Smoking status: Never Smoker  . Smokeless tobacco: Never Used  . Alcohol use No  . Drug use: No  . Sexual activity: Yes    Partners: Male    Birth control/ protection: None   Other Topics Concern  . Not on file   Social History Narrative   Works for [employer]   Daughter- 2005   Daughter-2014   Engaged   Enjoys spending itme with her family, friends    Past Surgical History:  Procedure Laterality Date  . CESAREAN SECTION  2005  . CESAREAN SECTION N/A 08/06/2013   Procedure: REPEAT CESAREAN SECTION;  Surgeon: Shelly Bombard, MD;  Location: [facility];  Service: Obstetrics;  Laterality: N/A;  . CHOLECYSTECTOMY  2007  . DILATION AND CURETTAGE OF UTERUS  12/18/2015   pt reported miscarriage  . EXCISION METACARPAL MASS Right 09/21/2013   Procedure: RIGHT PALM EXCISION MASS;  Surgeon: Tennis Must, MD;  Location: [facility];  Service: Orthopedics;  Laterality: Right;    Family History  Problem Relation Age of Onset  . Arthritis Mother   . Cancer Mother 71    breast  . ALS Father   . Breast cancer Maternal Aunt   . Lung cancer Maternal Uncle   . Diabetes Paternal Aunt   .  Arthritis Maternal Grandmother   . Breast cancer Maternal Grandmother   . Heart disease Paternal Grandmother   . Hypertension Paternal Grandmother   . Diabetes Paternal Grandfather     No Known Allergies  Current Outpatient Prescriptions on File Prior to Visit  Medication Sig Dispense Refill  . albuterol (PROVENTIL HFA;VENTOLIN HFA) 108 (90 BASE) MCG/ACT inhaler Inhale 2 puffs into the lungs every 6 (six) hours as needed for wheezing or shortness of breath. 1 Inhaler 0  . triamcinolone cream (KENALOG) 0.1 % Apply 1 application topically 2 (two) times daily. (Patient not taking: Reported on 12/21/2016) 30 g 0   No current facility-administered medications on file prior to visit.     BP 120/60   Pulse (!) 103   Temp 99.1 F (37.3 C)  (Oral)   Resp 16   Ht 5\' 3"  (1.6 m)   Wt 202 lb 6.4 oz (91.8 kg)   LMP 12/09/2016   SpO2 99% Comment: room air  BMI 35.85 kg/m    Objective:   Physical Exam Physical Exam  Constitutional: She is oriented to person, place, and time. She appears well-developed and well-nourished. No distress.  HENT:  Head: Normocephalic and atraumatic.  Right Ear: Tympanic membrane and ear canal normal.  Left Ear: Tympanic membrane and ear canal normal.  Mouth/Throat: Oropharynx is clear and moist.  Eyes: Pupils are equal, round, and reactive to light. No scleral icterus.  Neck: Normal range of motion. No thyromegaly present.  Cardiovascular: Normal rate and regular rhythm.   No murmur heard. Pulmonary/Chest: Effort normal and breath sounds normal. No respiratory distress. He has no wheezes. She has no rales. She exhibits no tenderness.  Abdominal: Soft. Bowel sounds are normal. She exhibits no distension and no mass. There is no tenderness. There is no rebound and no guarding.  Musculoskeletal: She exhibits no edema.  Lymphadenopathy:    She has no cervical adenopathy.  Neurological: She is alert and oriented to person, place, and time. She has normal patellar reflexes. She exhibits normal muscle tone. Coordination normal.  Skin: Skin is warm and dry.  Psychiatric: She has a normal mood and affect. Her behavior is normal. Judgment and thought content normal.  Breasts: Examined lying Right: Without masses, retractions, discharge or axillary adenopathy.  Left: Without masses, retractions, discharge or axillary adenopathy.  Inguinal/mons: Normal without inguinal adenopathy  External genitalia: Normal  BUS/Urethra/Skene's glands: Normal  Bladder: Normal  Vagina: Normal  Cervix: Normal  Uterus: normal in size, shape and contour. Midline and mobile  Adnexa/parametria:  Rt: Without masses or tenderness.  Lt: Without masses or tenderness.  Anus and perineum: Normal            Assessment &  Plan:   Preventative care- discussed healthy diet, exercise, weight loss. Declines flu shot. Pap performed today with co-testing. Obtain routine lab work. She is not using birth control and hopes to become pregnant soon. Advised pt to begin a prenatal vitamin.  Anxiety- has panic attacks about 2x a month. Has tried counseling in the past and did not find it helpful. Advised against adding new medication at this point due to wanting to become pregnant.  We discussed exercise, yoga, aromatherapy as alternatives for stress/anxiety management. Advised pt to let me know if her symptoms worsen or become more frequent.   IBS-D- she requests referral to GI. Will arrange.          Assessment & Plan:

## 2016-12-21 NOTE — Progress Notes (Signed)
Pre visit review using our clinic review tool, if applicable. No additional management support is needed unless otherwise documented below in the visit note. 

## 2016-12-21 NOTE — Patient Instructions (Signed)
Please continue lab work prior to leaving. Add a prenatal vitamin once daily. You will be contacted about your referral to GI for your irritable bowel symptoms. Continue to work on [employer], exercise and weight loss.

## 2016-12-22 ENCOUNTER — Telehealth: Payer: Self-pay | Admitting: Family

## 2016-12-22 DIAGNOSIS — E876 Hypokalemia: Secondary | ICD-10-CM

## 2016-12-22 NOTE — Telephone Encounter (Signed)
Potassium is a little low. Focus on potassium rich diet as below.  Other labs look good.  Repeat bmet in 2 weeks, dx hypokalemia.   High potassium content foods  Highest content (>25 meq/100 g) High content (>6.2 meq/100 g)   Vegetables   Spinach   Tomatoes   Broccoli   Winter squash   Beets   Carrots   Cauliflower   Potatoes   Fruits   Bananas  Dried figs Cantaloupe  Molasses Kiwis  Seaweed Oranges  Very high content (>12.5 meq/100 g) Mangos  Dried fruits (dates, prunes) Meats  Nuts Ground beef  Avocados Steak  Bran cereals Pork  Wheat germ Veal  Lima beans Arline Asp

## 2016-12-24 NOTE — Telephone Encounter (Signed)
Left message on voicemail to return my call.  

## 2016-12-24 NOTE — Telephone Encounter (Signed)
Notified pt and she states her work schedule is changing for this year and she is not sure what her schedule will be in 2 weeks. States she will call back to schedule lab appt. Future lab order entered.

## 2016-12-26 LAB — CYTOLOGY - PAP
DIAGNOSIS: NEGATIVE
HPV (WINDOPATH): NOT DETECTED

## 2016-12-27 ENCOUNTER — Other Ambulatory Visit: Payer: Self-pay | Admitting: Medical

## 2016-12-28 MED ORDER — TRIAMCINOLONE ACETONIDE 0.1 % EX CREA: 1.0000 "application " | TOPICAL_CREAM | Freq: Two times a day (BID) | CUTANEOUS | 0 refills | Status: DC

## 2017-02-04 ENCOUNTER — Ambulatory Visit (INDEPENDENT_AMBULATORY_CARE_PROVIDER_SITE_OTHER): Payer: Federal, State, Local not specified - PPO | Admitting: Gastroenterology

## 2017-02-04 ENCOUNTER — Encounter: Payer: Self-pay | Admitting: Gastroenterology

## 2017-02-04 VITALS — BP 112/70 | HR 96 | Ht 63.0 in | Wt 206.0 lb

## 2017-02-04 DIAGNOSIS — R1031 Right lower quadrant pain: Secondary | ICD-10-CM

## 2017-02-04 DIAGNOSIS — R197 Diarrhea, unspecified: Secondary | ICD-10-CM | POA: Diagnosis not present

## 2017-02-04 DIAGNOSIS — R152 Fecal urgency: Secondary | ICD-10-CM

## 2017-02-04 DIAGNOSIS — R1032 Left lower quadrant pain: Secondary | ICD-10-CM | POA: Diagnosis not present

## 2017-02-04 MED ORDER — COLESTIPOL HCL 1 G PO TABS: 2.0000 g | ORAL_TABLET | Freq: Two times a day (BID) | ORAL | 5 refills | Status: DC

## 2017-02-04 NOTE — Progress Notes (Signed)
History of Present Illness: This is a 36 year old female referred by Debbrah Alar, NP for the evaluation of diarrhea, urgency and lower abdominal pain. She relates that her symptoms started immediately following her cholecystectomy in 2007 and have persisted since. She relates about 2 loose urgent stools per day. Urgency is the most difficult problem. She relates lower abdominal discomfort and cramping associated with a bowel movement. She has no other GI complaints. Denies weight loss, constipation, change in stool caliber, melena, hematochezia, nausea, vomiting, dysphagia, reflux symptoms, chest pain.   No Known Allergies Outpatient Medications Prior to Visit  Medication Sig Dispense Refill  . albuterol (PROVENTIL HFA;VENTOLIN HFA) 108 (90 BASE) MCG/ACT inhaler Inhale 2 puffs into the lungs every 6 (six) hours as needed for wheezing or shortness of breath. 1 Inhaler 0  . Prenatal Multivit-Min-Fe-FA (PRENATAL VITAMINS) 0.8 MG tablet Take 1 tablet by mouth daily.    Marland Kitchen triamcinolone cream (KENALOG) 0.1 % Apply 1 application topically 2 (two) times daily. (Patient not taking: Reported on 02/04/2017) 30 g 0   No facility-administered medications prior to visit.    Past Medical History:  Diagnosis Date  . Anxiety   . Asthma    no meds needed, maybe 1-2 times per year  . Chicken pox   . Dysrhythmia    palpitations with pregnancy  . GERD (gastroesophageal reflux disease)    no meds  . Hyperlipidemia   . Obesity   . PONV (postoperative nausea and vomiting)    Past Surgical History:  Procedure Laterality Date  . CESAREAN SECTION  2005  . CESAREAN SECTION N/A 08/06/2013   Procedure: REPEAT CESAREAN SECTION;  Surgeon: Shelly Bombard, MD;  Location: [facility];  Service: Obstetrics;  Laterality: N/A;  . CHOLECYSTECTOMY  2007  . DILATION AND CURETTAGE OF UTERUS  12/18/2015   pt reported miscarriage  . EXCISION METACARPAL MASS Right 09/21/2013   Procedure: RIGHT PALM EXCISION MASS;   Surgeon: Tennis Must, MD;  Location: [facility];  Service: Orthopedics;  Laterality: Right;   Social History   Social History  . Marital status: Single    Spouse name: N/A  . Number of children: 2  . Years of education: N/A   Occupational History  . manager    Social History Main Topics  . Smoking status: Never Smoker  . Smokeless tobacco: Never Used  . Alcohol use No  . Drug use: No  . Sexual activity: Yes    Partners: Male    Birth control/ protection: None   Other Topics Concern  . None   Social History Narrative   Works for [employer]   Daughter- 2005   Daughter-2014   Engaged   Enjoys spending itme with her family, friends   Family History  Problem Relation Age of Onset  . Arthritis Mother   . Breast cancer Mother 1  . ALS Father   . Breast cancer Maternal Aunt   . Lung cancer Maternal Uncle   . Diabetes Paternal Aunt   . Arthritis Maternal Grandmother   . Breast cancer Maternal Grandmother   . Heart disease Paternal Grandmother   . Hypertension Paternal Grandmother   . Diabetes Paternal Grandfather       Review of Systems: Pertinent positive and negative review of systems were noted in the above HPI section. All other review of systems were otherwise negative.    Physical Exam: General: Well developed, well nourished, no acute distress Head: Normocephalic and atraumatic  Eyes:  sclerae anicteric, EOMI Ears: Normal auditory acuity Mouth: No deformity or lesions Neck: Supple, no masses or thyromegaly Lungs: Clear throughout to auscultation Heart: Regular rate and rhythm; no murmurs, rubs or bruits Abdomen: Soft, mild lower abdominal tenderness and non distended. No masses, hepatosplenomegaly or hernias noted. Normal Bowel sounds Musculoskeletal: Symmetrical with no gross deformities  Skin: No lesions on visible extremities Pulses:  Normal pulses noted Extremities: No clubbing, cyanosis, edema or deformities  noted Neurological: Alert oriented x 4, grossly nonfocal Cervical Nodes:  No significant cervical adenopathy Inguinal Nodes: No significant inguinal adenopathy Psychological:  Alert and cooperative. Normal mood and affect  Assessment and Recommendations:  1. Diarrhea, urgency, lower abdominal pain since cholecystectomy in 2007. Possible bile salt diarrhea. R/O IBS and other disorders. Trial of colestipol 2 g by mouth twice a day. Adjust dosing to reduce urgency and loose stools. Plan for return office visit in 6 weeks. If symptoms persist plan for a thorough evaluation.    cc: Debbrah Alar, NP [address] [place], [place] [id number]

## 2017-02-04 NOTE — Patient Instructions (Signed)
We have sent the following medications to your pharmacy for you to pick up at your convenience: Colestipol.   Call our office back if this medication is strong.   Normal BMI (Body Mass Index- based on height and weight) is between 19 and 25. Your BMI today is Body mass index is 36.49 kg/m. Marland Kitchen Please consider follow up  regarding your BMI with your Primary Care Provider.  Thank you for choosing me and [facility].  Pricilla Riffle. Dagoberto Ligas., MD., Marval Regal

## 2017-03-18 ENCOUNTER — Ambulatory Visit: Payer: Self-pay | Admitting: Gastroenterology

## 2017-05-17 ENCOUNTER — Ambulatory Visit (INDEPENDENT_AMBULATORY_CARE_PROVIDER_SITE_OTHER): Payer: Federal, State, Local not specified - PPO | Admitting: Family

## 2017-05-17 ENCOUNTER — Encounter: Payer: Self-pay | Admitting: Family

## 2017-05-17 VITALS — BP 122/83 | HR 78 | Temp 98.8°F | Resp 16 | Ht 63.0 in | Wt 209.2 lb

## 2017-05-17 DIAGNOSIS — N912 Amenorrhea, unspecified: Secondary | ICD-10-CM | POA: Diagnosis not present

## 2017-05-17 DIAGNOSIS — Z3490 Encounter for supervision of normal pregnancy, unspecified, unspecified trimester: Secondary | ICD-10-CM

## 2017-05-17 LAB — POCT URINE HCG BY VISUAL COLOR COMPARISON TESTS: Preg Test, Ur: POSITIVE — AB

## 2017-05-17 NOTE — Progress Notes (Signed)
Subjective:    Patient ID: Savannah Henry, female    DOB: 04-23-1981, 36 y.o.   MRN: [id number]  HPI   Savannah Henry is a 36 yr old female who presents today to discuss a positive home pregnancy test.  She believes that her LMP was in the middle of March. She notes that she has had some mild lightheadedness and noted some "fluttering" in her pelvis. She has 2 children already. She is engaged.  She is excited to be pregnant.   Review of Systems See HPI  Past Medical History:  Diagnosis Date  . Anxiety   . Asthma    no meds needed, maybe 1-2 times per year  . Chicken pox   . Dysrhythmia    palpitations with pregnancy  . GERD (gastroesophageal reflux disease)    no meds  . Hyperlipidemia   . Obesity   . PONV (postoperative nausea and vomiting)      Social History   Social History  . Marital status: Single    Spouse name: N/A  . Number of children: 2  . Years of education: N/A   Occupational History  . manager    Social History Main Topics  . Smoking status: Never Smoker  . Smokeless tobacco: Never Used  . Alcohol use No  . Drug use: No  . Sexual activity: Yes    Partners: Male    Birth control/ protection: None   Other Topics Concern  . Not on file   Social History Narrative   Works for [employer]   Daughter- 2005   Daughter-2014   Engaged   Enjoys spending itme with her family, friends    Past Surgical History:  Procedure Laterality Date  . CESAREAN SECTION  2005  . CESAREAN SECTION N/A 08/06/2013   Procedure: REPEAT CESAREAN SECTION;  Surgeon: Shelly Bombard, MD;  Location: [facility];  Service: Obstetrics;  Laterality: N/A;  . CHOLECYSTECTOMY  2007  . DILATION AND CURETTAGE OF UTERUS  12/18/2015   pt reported miscarriage  . EXCISION METACARPAL MASS Right 09/21/2013   Procedure: RIGHT PALM EXCISION MASS;  Surgeon: Tennis Must, MD;  Location: [place];  Service: Orthopedics;  Laterality: Right;    Family History  Problem Relation  Age of Onset  . Arthritis Mother   . Breast cancer Mother 32  . ALS Father   . Breast cancer Maternal Aunt   . Lung cancer Maternal Uncle   . Diabetes Paternal Aunt   . Arthritis Maternal Grandmother   . Breast cancer Maternal Grandmother   . Heart disease Paternal Grandmother   . Hypertension Paternal Grandmother   . Diabetes Paternal Grandfather     No Known Allergies  Current Outpatient Prescriptions on File Prior to Visit  Medication Sig Dispense Refill  . albuterol (PROVENTIL HFA;VENTOLIN HFA) 108 (90 BASE) MCG/ACT inhaler Inhale 2 puffs into the lungs every 6 (six) hours as needed for wheezing or shortness of breath. (Patient not taking: Reported on 05/17/2017) 1 Inhaler 0   No current facility-administered medications on file prior to visit.     BP 122/83 (BP Location: Left Arm, Cuff Size: Large)   Pulse 78   Temp 98.8 F (37.1 C) (Oral)   Resp 16   Ht 5\' 3"  (1.6 m)   Wt 209 lb 3.2 oz (94.9 kg)   LMP 02/14/2017   SpO2 100%   BMI 37.06 kg/m       Objective:   Physical Exam  Constitutional:  She is oriented to person, place, and time. She appears well-developed and well-nourished. No distress.  Neurological: She is alert and oriented to person, place, and time.  Skin: Skin is warm and dry.  Psychiatric: She has a normal mood and affect. Her behavior is normal. Judgment and thought content normal.          Assessment & Plan:  Pregnancy- pregnancy test is positive in our office.  I will arrange a referral to OB/GYN. She is already taking a prenatal vitamin.  She does not drink or smoke. We discussed healthy diet.  15 minutes spent counseling patient on prenatal health and early pregnancy.

## 2017-05-17 NOTE — Patient Instructions (Signed)
You will be contacted about your referral to GYN. Congratulations!   First Trimester of Pregnancy The first trimester of pregnancy is from week 1 until the end of week 13 (months 1 through 3). During this time, your baby will begin to develop inside you. At 6-8 weeks, the eyes and face are formed, and the heartbeat can be seen on ultrasound. At the end of 12 weeks, all the baby's organs are formed. Prenatal care is all the medical care you receive before the birth of your baby. Make sure you get good prenatal care and follow all of your doctor's instructions. Follow these instructions at home: Medicines  Take over-the-counter and prescription medicines only as told by your doctor. Some medicines are safe and some medicines are not safe during pregnancy.  Take a prenatal vitamin that contains at least 600 micrograms (mcg) of folic acid.  If you have trouble pooping (constipation), take medicine that will make your stool soft (stool softener) if your doctor approves. Eating and drinking  Eat regular, healthy meals.  Your doctor will tell you the amount of weight gain that is right for you.  Avoid raw meat and uncooked cheese.  If you feel sick to your stomach (nauseous) or throw up (vomit): ? Eat 4 or 5 small meals a day instead of 3 large meals. ? Try eating a few soda crackers. ? Drink liquids between meals instead of during meals.  To prevent constipation: ? Eat foods that are high in fiber, like fresh fruits and vegetables, whole grains, and beans. ? Drink enough fluids to keep your pee (urine) clear or pale yellow. Activity  Exercise only as told by your doctor. Stop exercising if you have cramps or pain in your lower belly (abdomen) or low back.  Do not exercise if it is too hot, too humid, or if you are in a place of great height (high altitude).  Try to avoid standing for long periods of time. Move your legs often if you must stand in one place for a long time.  Avoid  heavy lifting.  Wear low-heeled shoes. Sit and stand up straight.  You can have sex unless your doctor tells you not to. Relieving pain and discomfort  Wear a good support bra if your breasts are sore.  Take warm water baths (sitz baths) to soothe pain or discomfort caused by hemorrhoids. Use hemorrhoid cream if your doctor says it is okay.  Rest with your legs raised if you have leg cramps or low back pain.  If you have puffy, bulging veins (varicose veins) in your legs: ? Wear support hose or compression stockings as told by your doctor. ? Raise (elevate) your feet for 15 minutes, 3-4 times a day. ? Limit salt in your food. Prenatal care  Schedule your prenatal visits by the twelfth week of pregnancy.  Write down your questions. Take them to your prenatal visits.  Keep all your prenatal visits as told by your doctor. This is important. Safety  Wear your seat belt at all times when driving.  Make a list of emergency phone numbers. The list should include numbers for family, friends, the hospital, and police and fire departments. General instructions  Ask your doctor for a referral to a local prenatal class. Begin classes no later than at the start of month 6 of your pregnancy.  Ask for help if you need counseling or if you need help with nutrition. Your doctor can give you advice or tell you where to  go for help.  Do not use hot tubs, steam rooms, or saunas.  Do not douche or use tampons or scented sanitary pads.  Do not cross your legs for long periods of time.  Avoid all herbs and alcohol. Avoid drugs that are not approved by your doctor.  Do not use any tobacco products, including cigarettes, chewing tobacco, and electronic cigarettes. If you need help quitting, ask your doctor. You may get counseling or other support to help you quit.  Avoid cat litter boxes and soil used by cats. These carry germs that can cause birth defects in the baby and can cause a loss of your  baby (miscarriage) or stillbirth.  Visit your dentist. At home, brush your teeth with a soft toothbrush. Be gentle when you floss. Contact a doctor if:  You are dizzy.  You have mild cramps or pressure in your lower belly.  You have a nagging pain in your belly area.  You continue to feel sick to your stomach, you throw up, or you have watery poop (diarrhea).  You have a bad smelling fluid coming from your vagina.  You have pain when you pee (urinate).  You have increased puffiness (swelling) in your face, hands, legs, or ankles. Get help right away if:  You have a fever.  You are leaking fluid from your vagina.  You have spotting or bleeding from your vagina.  You have very bad belly cramping or pain.  You gain or lose weight rapidly.  You throw up blood. It may look like coffee grounds.  You are around people who have [place] measles, fifth disease, or chickenpox.  You have a very bad headache.  You have shortness of breath.  You have any kind of trauma, such as from a fall or a car accident. Summary  The first trimester of pregnancy is from week 1 until the end of week 13 (months 1 through 3).  To take care of yourself and your unborn baby, you will need to eat healthy meals, take medicines only if your doctor tells you to do so, and do activities that are safe for you and your baby.  Keep all follow-up visits as told by your doctor. This is important as your doctor will have to ensure that your baby is healthy and growing well. This information is not intended to replace advice given to you by your health care provider. Make sure you discuss any questions you have with your health care provider. Document Released: 05/21/2008 Document Revised: 12/11/2016 Document Reviewed: 12/11/2016 Elsevier Interactive Patient Education  2017 [employer].

## 2017-06-05 ENCOUNTER — Encounter: Payer: Self-pay | Admitting: Obstetrics & Gynecology

## 2017-06-05 ENCOUNTER — Ambulatory Visit (INDEPENDENT_AMBULATORY_CARE_PROVIDER_SITE_OTHER): Payer: Federal, State, Local not specified - PPO | Admitting: Obstetrics & Gynecology

## 2017-06-05 VITALS — BP 122/75 | HR 75 | Wt 211.0 lb

## 2017-06-05 DIAGNOSIS — O34219 Maternal care for unspecified type scar from previous cesarean delivery: Secondary | ICD-10-CM

## 2017-06-05 DIAGNOSIS — Z3480 Encounter for supervision of other normal pregnancy, unspecified trimester: Secondary | ICD-10-CM | POA: Insufficient documentation

## 2017-06-05 DIAGNOSIS — Z113 Encounter for screening for infections with a predominantly sexual mode of transmission: Secondary | ICD-10-CM | POA: Diagnosis not present

## 2017-06-05 DIAGNOSIS — Z3491 Encounter for supervision of normal pregnancy, unspecified, first trimester: Secondary | ICD-10-CM | POA: Diagnosis not present

## 2017-06-05 DIAGNOSIS — O09521 Supervision of elderly multigravida, first trimester: Secondary | ICD-10-CM | POA: Diagnosis not present

## 2017-06-05 DIAGNOSIS — O09529 Supervision of elderly multigravida, unspecified trimester: Secondary | ICD-10-CM | POA: Insufficient documentation

## 2017-06-05 NOTE — Progress Notes (Signed)
Bedside dating ultrasound performed. CRL: 2.48  Gestation:9-1 weeks Single IUP FHR: 168 BPM Patient unsure of LMP. Kathrene Alu RNBSN   Last PAP 12-21-16 WNL Neg- HPV

## 2017-06-05 NOTE — Progress Notes (Signed)
  Subjective:    Savannah Henry is being seen today for her first obstetrical visit.  [id number] LMP Feb date unk This is not a planned pregnancy. Very adopted. She is at [redacted]w[redacted]d gestation. Her obstetrical history is significant for advanced maternal age and obesity. Relationship with FOB: significant other, living together. Patient does intend to breast feed. Pregnancy history fully reviewed.  Patient reports nausea and fatigue.  Review of Systems:   Review of Systems  FH Father Myles Lipps ds  Mother- breast cancer in her 41's; pts MGM- breast cancer- age unknown she is age 52.  All of these women are in there 70's and 80's  Objective:     BP 122/75   Pulse 75   Wt 211 lb (95.7 kg)   LMP 02/08/2017   BMI 37.38 kg/m  Physical Exam  Exam General Appearance:    Alert, cooperative, no distress, appears stated age  Head:    Normocephalic, without obvious abnormality, atraumatic  Eyes:    conjunctiva/corneas clear, EOM's intact, both eyes  Ears:    Normal external ear canals, both ears  Nose:   Nares normal, septum midline, mucosa normal, no drainage    or sinus tenderness  Throat:   Lips, mucosa, and tongue normal; teeth and gums normal  Neck:   Supple, symmetrical, trachea midline, no adenopathy;    thyroid:  no enlargement/tenderness/nodules  Back:     Symmetric, no curvature, ROM normal, no CVA tenderness  Lungs:     Clear to auscultation bilaterally, respirations unlabored  Chest Wall:    No tenderness or deformity   Heart:    Regular rate and rhythm, S1 and S2 normal, no murmur, rub   or gallop  Breast Exam:    No tenderness, masses, or nipple abnormality  Abdomen:     Soft, non-tender, bowel sounds active all four quadrants,    no masses, no organomegaly  Genitalia:    Normal female without lesion, discharge or tenderness     Extremities:   Extremities normal, atraumatic, no cyanosis or edema  Pulses:   2+ and symmetric all extremities  Skin:   Skin color, texture, turgor  normal, no rashes or lesions      Assessment:    Pregnancy: [id number] Patient Active Problem List   Diagnosis Date Noted  . Supervision of other normal pregnancy, antepartum 06/05/2017  . Previous cesarean delivery affecting pregnancy 06/05/2017  . Advanced maternal age in multigravida 06/05/2017  . Depression with anxiety 10/09/2012  . Obesity (BMI 35.0-39.9 without comorbidity) 08/11/2012        Plan:     Initial labs drawn. Prenatal vitamins. Problem list reviewed and updated. AFP3 discussed: requested. Role of ultrasound in pregnancy discussed; fetal survey: requested. Amniocentesis discussed: not indicated. Follow up in 4 weeks. 60% of 45 min visit spent on counseling and coordination of care.  Pt desires first trimester screening    Lavonia Drafts 06/05/2017

## 2017-06-05 NOTE — Patient Instructions (Signed)
Eating Plan for Hyperemesis Gravidarum °Hyperemesis gravidarum is a severe form of morning sickness. Because this condition causes severe nausea and vomiting, it can lead to dehydration, malnutrition, and weight loss. One way to lessen the symptoms of nausea and vomiting is to follow the eating plan for hyperemesis gravidarum. It is often used along with prescribed medicines to control your symptoms. °What can I do to relieve my symptoms? °Listen to your body. Everyone is different and has different preferences. Find what works best for you. Take any of the following actions that are helpful to you: °· Eat and drink slowly. °· Eat 5-6 small meals daily instead of 3 large meals. °· Eat crackers before you get out of bed in the morning. °· Try having a snack in the middle of the night. °· Starchy foods are usually tolerated well. Examples include cereal, toast, bread, potatoes, pasta, rice, and pretzels. °· Ginger may help with nausea. Add ¼ tsp ground ginger to hot tea or choose ginger tea. °· Try drinking 100% fruit juice or an electrolyte drink. An electrolyte drink contains sodium, potassium, and chloride. °· Continue to take your prenatal vitamins as told by your health care provider. If you are having trouble taking your prenatal vitamins, talk with your health care provider about different options. °· Include at least 1 serving of protein with your meals and snacks. Protein options include meats or poultry, beans, nuts, eggs, and yogurt. Try eating a protein-rich snack before bed. Examples of these snacks include cheese and crackers or half of a peanut butter or turkey sandwich. °· Consider eliminating foods that trigger your symptoms. These may include spicy foods, coffee, high-fat foods, very sweet foods, and acidic foods. °· Try meals that have more protein combined with bland, salty, lower-fat, and dry foods, such as nuts, seeds, pretzels, crackers, and cereal. °· Talk with your healthcare provider about  starting a supplement of vitamin B6. °· Have fluids that are cold, clear, and carbonated or sour. Examples include lemonade, ginger ale, lemon-lime soda, ice water, and sparkling water. °· Try lemon or mint tea. °· Try brushing your teeth or using a mouth rinse after meals. ° °What should I avoid to reduce my symptoms? °Avoiding some of the following things may help reduce your symptoms. °· Foods with strong smells. Try eating meals in well-ventilated areas that are free of odors. °· Drinking water or other beverages with meals. Try not to drink anything during the 30 minutes before and after your meals. °· Drinking more than 1 cup of fluid at a time. Sometimes using a straw helps. °· Fried or high-fat foods, such as butter and cream sauces. °· Spicy foods. °· Skipping meals as best as you can. Nausea can be more intense on an empty stomach. If you cannot tolerate food at that time, do not force it. Try sucking on ice chips or other frozen items, and make up for missed calories later. °· Lying down within 2 hours after eating. °· Environmental triggers. These may include smoky rooms, closed spaces, rooms with strong smells, warm or humid places, overly loud and noisy rooms, and rooms with motion or flickering lights. °· Quick and sudden changes in your movement. ° °This information is not intended to replace advice given to you by your health care provider. Make sure you discuss any questions you have with your health care provider. °Document Released: 09/30/2007 Document Revised: 08/01/2016 Document Reviewed: 07/03/2016 °Elsevier Interactive Patient Education © 2018 [employer] Inc[employer] ° °

## 2017-06-07 LAB — GC/CHLAMYDIA PROBE AMP (~~LOC~~) NOT AT ARMC
CHLAMYDIA, DNA PROBE: NEGATIVE
NEISSERIA GONORRHEA: NEGATIVE

## 2017-06-08 LAB — CULTURE, URINE COMPREHENSIVE

## 2017-06-17 LAB — OBSTETRIC PANEL, INCLUDING HIV
Antibody Screen: NEGATIVE
BASOS ABS: 0 10*3/uL (ref 0.0–0.2)
Basos: 0 %
EOS (ABSOLUTE): 0.1 10*3/uL (ref 0.0–0.4)
Eos: 1 %
HIV SCREEN 4TH GENERATION: NONREACTIVE
Hematocrit: 35.3 % (ref 34.0–46.6)
Hemoglobin: 11.3 g/dL (ref 11.1–15.9)
Hepatitis B Surface Ag: NEGATIVE
Immature Grans (Abs): 0 10*3/uL (ref 0.0–0.1)
Immature Granulocytes: 0 %
Lymphocytes Absolute: 2.6 10*3/uL (ref 0.7–3.1)
Lymphs: 29 %
MCH: 25.3 pg — ABNORMAL LOW (ref 26.6–33.0)
MCHC: 32 g/dL (ref 31.5–35.7)
MCV: 79 fL (ref 79–97)
Monocytes Absolute: 0.9 10*3/uL (ref 0.1–0.9)
Monocytes: 10 %
NEUTROS ABS: 5.3 10*3/uL (ref 1.4–7.0)
Neutrophils: 60 %
PLATELETS: 307 10*3/uL (ref 150–379)
RBC: 4.46 x10E6/uL (ref 3.77–5.28)
RDW: 15.2 % (ref 12.3–15.4)
RPR Ser Ql: NONREACTIVE
Rh Factor: POSITIVE
Rubella Antibodies, IGG: 10.8 index (ref >0.99)
WBC: 8.9 10*3/uL (ref 3.4–10.8)

## 2017-06-17 LAB — INHERITEST SOCIETY GUIDED

## 2017-07-01 ENCOUNTER — Other Ambulatory Visit: Payer: Self-pay | Admitting: Obstetrics & Gynecology

## 2017-07-01 ENCOUNTER — Ambulatory Visit (HOSPITAL_COMMUNITY)
Admission: RE | Admit: 2017-07-01 | Discharge: 2017-07-01 | Disposition: A | Discharge status: Home / Self Care | Payer: Federal, State, Local not specified - PPO | Source: Ambulatory Visit | Attending: Obstetrics & Gynecology | Admitting: Obstetrics & Gynecology

## 2017-07-01 ENCOUNTER — Encounter (HOSPITAL_COMMUNITY): Payer: Self-pay

## 2017-07-01 DIAGNOSIS — O09521 Supervision of elderly multigravida, first trimester: Secondary | ICD-10-CM

## 2017-07-01 DIAGNOSIS — Z315 Encounter for genetic counseling: Secondary | ICD-10-CM | POA: Diagnosis not present

## 2017-07-01 DIAGNOSIS — O3412 Maternal care for benign tumor of corpus uteri, second trimester: Secondary | ICD-10-CM

## 2017-07-01 DIAGNOSIS — D259 Leiomyoma of uterus, unspecified: Secondary | ICD-10-CM

## 2017-07-01 DIAGNOSIS — O99211 Obesity complicating pregnancy, first trimester: Secondary | ICD-10-CM | POA: Diagnosis not present

## 2017-07-01 DIAGNOSIS — Z3480 Encounter for supervision of other normal pregnancy, unspecified trimester: Secondary | ICD-10-CM

## 2017-07-01 DIAGNOSIS — Z36 Encounter for antenatal screening for chromosomal anomalies: Secondary | ICD-10-CM | POA: Diagnosis not present

## 2017-07-01 DIAGNOSIS — Z3A12 12 weeks gestation of pregnancy: Secondary | ICD-10-CM

## 2017-07-01 DIAGNOSIS — Z98891 History of uterine scar from previous surgery: Secondary | ICD-10-CM

## 2017-07-01 DIAGNOSIS — O99212 Obesity complicating pregnancy, second trimester: Secondary | ICD-10-CM

## 2017-07-01 DIAGNOSIS — Z3682 Encounter for antenatal screening for nuchal translucency: Secondary | ICD-10-CM | POA: Diagnosis not present

## 2017-07-01 DIAGNOSIS — O3411 Maternal care for benign tumor of corpus uteri, first trimester: Secondary | ICD-10-CM | POA: Insufficient documentation

## 2017-07-01 DIAGNOSIS — O34211 Maternal care for low transverse scar from previous cesarean delivery: Secondary | ICD-10-CM | POA: Insufficient documentation

## 2017-07-01 DIAGNOSIS — O34219 Maternal care for unspecified type scar from previous cesarean delivery: Secondary | ICD-10-CM

## 2017-07-01 NOTE — Progress Notes (Signed)
Genetic Counseling  High-Risk Gestation Note  Appointment Date:  07/01/2017 Referred By: Savannah Henry* Date of Birth:  01-30-1981   Pregnancy History: [id number] Estimated Date of Delivery: 01/07/18 Estimated Gestational Age: [redacted]w[redacted]d Attending: Renella Cunas, MD  [name] was seen for genetic counseling because of a maternal age of 36 y.o..     In summary:  Discussed AMA and associated risk for fetal aneuploidy  Discussed options for screening  First screen- elected to pursue today  Quad screen  NIPS- considering further; may pursue pending results of first screening  Ultrasound- NT performed today; see separate report  Discussed diagnostic testing options  Amniocentesis- declined  Reviewed family history concerns   She was counseled regarding maternal age and the association with risk for chromosome conditions due to nondisjunction with aging of the ova.   We reviewed chromosomes, nondisjunction, and the associated 1 in 13 risk for fetal aneuploidy related to a maternal age of 36 y.o. at [redacted]w[redacted]d gestation.  She was counseled that the risk for aneuploidy decreases as gestational age increases, accounting for those pregnancies which spontaneously abort.  We specifically discussed Down syndrome (trisomy 53), trisomies 62 and 61, and sex chromosome aneuploidies (47,XXX and 47,XXY) including the common features and prognoses of each.   We reviewed available screening options including First Screen, Quad screen, noninvasive prenatal screening (NIPS)/cell free DNA (cfDNA) screening, and detailed ultrasound.  She was counseled that screening tests are used to modify a patient's a priori risk for aneuploidy, typically based on age. This estimate provides a pregnancy specific risk assessment. We reviewed the benefits and limitations of each option. Specifically, we discussed the conditions for which each test screens, the detection rates, and false positive rates of each. She  was also counseled regarding diagnostic testing via amniocentesis. We reviewed the approximate 1 in 527-782 risk for complications from amniocentesis, including spontaneous pregnancy loss. We discussed the possible results that the tests might provide including: positive, negative, unanticipated, and no result. Finally, they were counseled regarding the cost of each option and potential out of pocket expenses.  After consideration of all the options, she elected to proceed with First trimester screening today. Results will be back in approximately 7-10 days and will be sent directly to her primary OB provider.  She plans to further consider NIPS (Panorama) and may elect to pursue, pending results of First screen.  She declined amniocentesis.  A nuchal translucency ultrasound was performed today.  The report will be documented separately.  Detailed ultrasound is available at ~18+ weeks gestation. No follow-up appointments made at this time. She understands that screening tests cannot rule out all birth defects or genetic syndromes. The patient was advised of this limitation and states she still does not want additional testing at this time.   Both family histories were reviewed and found to be contributory for autism for a maternal half-brother to the father of the pregnancy. No underlying etiology was known. We discussed that autism is part of the spectrum of conditions referred to as Autistic spectrum disorders (ASD). We discussed that ASDs are among the most common neurodevelopmental disorders, with approximately 1 in 68 children meeting criteria for ASD, according to the Centers for Disease Control. Approximately 80% of individuals diagnosed are female. There is strong evidence that genetic factors play a critical role in development of ASD. There have been recent advances in identifying specific genetic causes of ASD, however, there are still many individuals for whom the etiology of the ASD is not  known.   Once a family has a child with a diagnosis of ASD, there is a 13.5% chance to have another child with ASD. If the pregnancy is female the chance is approximately 9%, and approximately 26% if the pregnancy is female. Recurrence risk data are limited for more extended degree relatives, but in the case of a third degree relative, recurrence risk estimate would be expected to be lower than that of a full sibling and possibly close to the general population risk. They understand that at this time there is not genetic testing available for ASD for most families. In the case of an identified genetic cause, recurrence risk estimate may change. In the absence of an identified genetic etiology, prenatal screening or testing would not be available in the current pregnancy for the autism spectrum disorders in the family. Without further information regarding the provided family history, an accurate genetic risk cannot be calculated. Further genetic counseling is warranted if more information is obtained.  Ms . Savannah Henry denied exposure to environmental toxins or chemical agents. She denied the use of alcohol, tobacco or street drugs. She denied significant viral illnesses during the course of her pregnancy. Her medical and surgical histories were noncontributory.   I counseled [name] regarding the above risks and available options.  The approximate face-to-face time with the genetic counselor was 40 minutes.  Chipper Oman, MS,  Certified Genetic Counselor 07/01/2017

## 2017-07-03 ENCOUNTER — Ambulatory Visit (INDEPENDENT_AMBULATORY_CARE_PROVIDER_SITE_OTHER): Payer: Federal, State, Local not specified - PPO | Admitting: Obstetrics & Gynecology

## 2017-07-03 VITALS — BP 126/77 | HR 84 | Wt 210.0 lb

## 2017-07-03 DIAGNOSIS — F418 Other specified anxiety disorders: Secondary | ICD-10-CM

## 2017-07-03 DIAGNOSIS — Z3480 Encounter for supervision of other normal pregnancy, unspecified trimester: Secondary | ICD-10-CM

## 2017-07-03 DIAGNOSIS — O99212 Obesity complicating pregnancy, second trimester: Secondary | ICD-10-CM

## 2017-07-03 DIAGNOSIS — E669 Obesity, unspecified: Secondary | ICD-10-CM

## 2017-07-03 DIAGNOSIS — O34219 Maternal care for unspecified type scar from previous cesarean delivery: Secondary | ICD-10-CM

## 2017-07-03 DIAGNOSIS — O09522 Supervision of elderly multigravida, second trimester: Secondary | ICD-10-CM

## 2017-07-03 DIAGNOSIS — Z3482 Encounter for supervision of other normal pregnancy, second trimester: Secondary | ICD-10-CM

## 2017-07-03 NOTE — Patient Instructions (Signed)

## 2017-07-03 NOTE — Progress Notes (Signed)
   PRENATAL VISIT NOTE  Subjective:  Savannah Henry is a 36 y.o. [id number] at [redacted]w[redacted]d being seen today for ongoing prenatal care.  She is currently monitored for the following issues for this high-risk pregnancy and has Obesity (BMI 35.0-39.9 without comorbidity); Depression with anxiety; Supervision of other normal pregnancy, antepartum; Previous cesarean delivery affecting pregnancy; and Advanced maternal age in multigravida on her problem list.  Patient reports nausea.  Contractions: Not present. Vag. Bleeding: None.   . Denies leaking of fluid.   The following portions of the patient's history were reviewed and updated as appropriate: allergies, current medications, past family history, past medical history, past social history, past surgical history and problem list. Problem list updated.  Objective:   Vitals:   07/03/17 1546  BP: 126/77  Pulse: 84  Weight: 210 lb (95.3 kg)    Fetal Status: Fetal Heart Rate (bpm): 156         General:  Alert, oriented and cooperative. Patient is in no acute distress.  Skin: Skin is warm and dry. No rash noted.   Cardiovascular: Normal heart rate noted  Respiratory: Normal respiratory effort, no problems with respiration noted  Abdomen: Soft, gravid, appropriate for gestational age.  Pain/Pressure: Absent     Pelvic: Cervical exam deferred        Extremities: Normal range of motion.  Edema: None  Mental Status:  Normal mood and affect. Normal behavior. Normal judgment and thought content.   Assessment and Plan:  Pregnancy: [id number] at [redacted]w[redacted]d  1. Supervision of other normal pregnancy, antepartum HgbA1c  2. Previous cesarean delivery affecting pregnancy  3. Obesity (BMI 35.0-39.9 without comorbidity)  4. Depression with anxiety  5. Elderly multigravida in second trimester  Preterm labor symptoms and general obstetric precautions including but not limited to vaginal bleeding, contractions, leaking of fluid and fetal movement were reviewed in  detail with the patient. Please refer to After Visit Summary for other counseling recommendations.  4 weeks   Lavonia Drafts, MD

## 2017-07-04 LAB — HEMOGLOBIN A1C
ESTIMATED AVERAGE GLUCOSE: 117 mg/dL
Hgb A1c MFr Bld: 5.7 % — ABNORMAL HIGH (ref 4.8–5.6)

## 2017-07-07 ENCOUNTER — Encounter: Payer: Self-pay | Admitting: Obstetrics & Gynecology

## 2017-07-07 DIAGNOSIS — R7303 Prediabetes: Secondary | ICD-10-CM | POA: Insufficient documentation

## 2017-07-07 DIAGNOSIS — E119 Type 2 diabetes mellitus without complications: Secondary | ICD-10-CM | POA: Insufficient documentation

## 2017-07-08 ENCOUNTER — Telehealth: Payer: Self-pay

## 2017-07-08 DIAGNOSIS — R739 Hyperglycemia, unspecified: Secondary | ICD-10-CM

## 2017-07-08 NOTE — Telephone Encounter (Signed)
-----   Message from Lavonia Drafts, MD sent at 07/07/2017  7:20 PM EDT ----- This pt has a slightly elevated HgbA1c. She is PRE diabetic. She needs referral to a nutritionist/diabetic educator.   Thx,  clh-S

## 2017-07-08 NOTE — Telephone Encounter (Signed)
Patient called and made aware that she has an elevated HgbA1C and that she will have a referral placed for nutritionist to meet with her. Patient states understanding. Kathrene Alu RNBSN

## 2017-07-10 ENCOUNTER — Other Ambulatory Visit (HOSPITAL_COMMUNITY): Payer: Self-pay

## 2017-07-17 ENCOUNTER — Ambulatory Visit: Payer: Self-pay | Admitting: *Deleted

## 2017-07-31 ENCOUNTER — Ambulatory Visit (INDEPENDENT_AMBULATORY_CARE_PROVIDER_SITE_OTHER): Payer: Federal, State, Local not specified - PPO | Admitting: Obstetrics & Gynecology

## 2017-07-31 ENCOUNTER — Other Ambulatory Visit: Payer: Self-pay | Admitting: Obstetrics & Gynecology

## 2017-07-31 VITALS — BP 125/64 | HR 86 | Wt 202.0 lb

## 2017-07-31 DIAGNOSIS — F418 Other specified anxiety disorders: Secondary | ICD-10-CM

## 2017-07-31 DIAGNOSIS — E669 Obesity, unspecified: Secondary | ICD-10-CM

## 2017-07-31 DIAGNOSIS — Z3482 Encounter for supervision of other normal pregnancy, second trimester: Secondary | ICD-10-CM

## 2017-07-31 DIAGNOSIS — Z3480 Encounter for supervision of other normal pregnancy, unspecified trimester: Secondary | ICD-10-CM | POA: Diagnosis not present

## 2017-07-31 DIAGNOSIS — R7303 Prediabetes: Secondary | ICD-10-CM

## 2017-07-31 DIAGNOSIS — O34219 Maternal care for unspecified type scar from previous cesarean delivery: Secondary | ICD-10-CM

## 2017-07-31 DIAGNOSIS — O09522 Supervision of elderly multigravida, second trimester: Secondary | ICD-10-CM

## 2017-07-31 NOTE — Patient Instructions (Signed)
Diabetes Mellitus and Food It is important for you to manage your blood sugar (glucose) level. Your blood glucose level can be greatly affected by what you eat. Eating healthier foods in the appropriate amounts throughout the day at about the same time each day will help you control your blood glucose level. It can also help slow or prevent worsening of your diabetes mellitus. Healthy eating may even help you improve the level of your blood pressure and reach or maintain a healthy weight. General recommendations for healthful eating and cooking habits include:  Eating meals and snacks regularly. Avoid going long periods of time without eating to lose weight.  Eating a diet that consists mainly of plant-based foods, such as fruits, vegetables, nuts, legumes, and whole grains.  Using low-heat cooking methods, such as baking, instead of high-heat cooking methods, such as deep frying.  Work with your dietitian to make sure you understand how to use the Nutrition Facts information on food labels. How can food affect me? Carbohydrates Carbohydrates affect your blood glucose level more than any other type of food. Your dietitian will help you determine how many carbohydrates to eat at each meal and teach you how to count carbohydrates. Counting carbohydrates is important to keep your blood glucose at a healthy level, especially if you are using insulin or taking certain medicines for diabetes mellitus. Alcohol Alcohol can cause sudden decreases in blood glucose (hypoglycemia), especially if you use insulin or take certain medicines for diabetes mellitus. Hypoglycemia can be a life-threatening condition. Symptoms of hypoglycemia (sleepiness, dizziness, and disorientation) are similar to symptoms of having too much alcohol. If your health care provider has given you approval to drink alcohol, do so in moderation and use the following guidelines:  Women should not have more than one drink per day, and men  should not have more than two drinks per day. One drink is equal to: ? 12 oz of beer. ? 5 oz of wine. ? 1 oz of hard liquor.  Do not drink on an empty stomach.  Keep yourself hydrated. Have water, diet soda, or unsweetened iced tea.  Regular soda, juice, and other mixers might contain a lot of carbohydrates and should be counted.  What foods are not recommended? As you make food choices, it is important to remember that all foods are not the same. Some foods have fewer nutrients per serving than other foods, even though they might have the same number of calories or carbohydrates. It is difficult to get your body what it needs when you eat foods with fewer nutrients. Examples of foods that you should avoid that are high in calories and carbohydrates but low in nutrients include:  Trans fats (most processed foods list trans fats on the Nutrition Facts label).  Regular soda.  Juice.  Candy.  Sweets, such as cake, pie, doughnuts, and cookies.  Fried foods.  What foods can I eat? Eat nutrient-rich foods, which will nourish your body and keep you healthy. The food you should eat also will depend on several factors, including:  The calories you need.  The medicines you take.  Your weight.  Your blood glucose level.  Your blood pressure level.  Your cholesterol level.  You should eat a variety of foods, including:  Protein. ? Lean cuts of meat. ? Proteins low in saturated fats, such as fish, egg whites, and beans. Avoid processed meats.  Fruits and vegetables. ? Fruits and vegetables that may help control blood glucose levels, such as apples,   mangoes, and yams.  Dairy products. ? Choose fat-free or low-fat dairy products, such as milk, yogurt, and cheese.  Grains, bread, pasta, and rice. ? Choose whole grain products, such as multigrain bread, whole oats, and brown rice. These foods may help control blood pressure.  Fats. ? Foods containing healthful fats, such as  nuts, avocado, olive oil, canola oil, and fish.  Does everyone with diabetes mellitus have the same meal plan? Because every person with diabetes mellitus is different, there is not one meal plan that works for everyone. It is very important that you meet with a dietitian who will help you create a meal plan that is just right for you. This information is not intended to replace advice given to you by your health care provider. Make sure you discuss any questions you have with your health care provider. Document Released: 08/30/2005 Document Revised: 05/10/2016 Document Reviewed: 10/30/2013 Elsevier Interactive Patient Education  2017 [employer] Diabetes Mellitus and Exercise Exercising regularly is important for your overall health, especially when you have diabetes (diabetes mellitus). Exercising is not only about losing weight. It has many health benefits, such as increasing muscle strength and bone density and reducing body fat and stress. This leads to improved fitness, flexibility, and endurance, all of which result in better overall health. Exercise has additional benefits for people with diabetes, including:  Reducing appetite.  Helping to lower and control blood glucose.  Lowering blood pressure.  Helping to control amounts of fatty substances (lipids) in the blood, such as cholesterol and triglycerides.  Helping the body to respond better to insulin (improving insulin sensitivity).  Reducing how much insulin the body needs.  Decreasing the risk for heart disease by: ? Lowering cholesterol and triglyceride levels. ? Increasing the levels of good cholesterol. ? Lowering blood glucose levels.  What is my activity plan? Your health care provider or certified diabetes educator can help you make a plan for the type and frequency of exercise (activity plan) that works for you. Make sure that you:  Do at least 150 minutes of moderate-intensity or vigorous-intensity exercise each  week. This could be brisk walking, biking, or water aerobics. ? Do stretching and strength exercises, such as yoga or weightlifting, at least 2 times a week. ? Spread out your activity over at least 3 days of the week.  Get some form of physical activity every day. ? Do not go more than 2 days in a row without some kind of physical activity. ? Avoid being inactive for more than 90 minutes at a time. Take frequent breaks to walk or stretch.  Choose a type of exercise or activity that you enjoy, and set realistic goals.  Start slowly, and gradually increase the intensity of your exercise over time.  What do I need to know about managing my diabetes?  Check your blood glucose before and after exercising. ? If your blood glucose is higher than 240 mg/dL (13.3 mmol/L) before you exercise, check your urine for ketones. If you have ketones in your urine, do not exercise until your blood glucose returns to normal.  Know the symptoms of low blood glucose (hypoglycemia) and how to treat it. Your risk for hypoglycemia increases during and after exercise. Common symptoms of hypoglycemia can include: ? Hunger. ? Anxiety. ? Sweating and feeling clammy. ? Confusion. ? Dizziness or feeling light-headed. ? Increased heart rate or palpitations. ? Blurry vision. ? Tingling or numbness around the mouth, lips, or tongue. ? Tremors or shakes. ?   Irritability.  Keep a rapid-acting carbohydrate snack available before, during, and after exercise to help prevent or treat hypoglycemia.  Avoid injecting insulin into areas of the body that are going to be exercised. For example, avoid injecting insulin into: ? The arms, when playing tennis. ? The legs, when jogging.  Keep records of your exercise habits. Doing this can help you and your health care provider adjust your diabetes management plan as needed. Write down: ? Food that you eat before and after you exercise. ? Blood glucose levels before and after you  exercise. ? The type and amount of exercise you have done. ? When your insulin is expected to peak, if you use insulin. Avoid exercising at times when your insulin is peaking.  When you start a new exercise or activity, work with your health care provider to make sure the activity is safe for you, and to adjust your insulin, medicines, or food intake as needed.  Drink plenty of water while you exercise to prevent dehydration or heat stroke. Drink enough fluid to keep your urine clear or pale yellow. This information is not intended to replace advice given to you by your health care provider. Make sure you discuss any questions you have with your health care provider. Document Released: 02/23/2004 Document Revised: 06/22/2016 Document Reviewed: 05/14/2016 Elsevier Interactive Patient Education  2018 [employer] Inc[employer]  

## 2017-07-31 NOTE — Progress Notes (Signed)
   PRENATAL VISIT NOTE  Subjective:  Savannah Henry is a 36 y.o. [id number] at [redacted]w[redacted]d being seen today for ongoing prenatal care.  She is currently monitored for the following issues for this high-risk pregnancy and has Obesity (BMI 35.0-39.9 without comorbidity); Depression with anxiety; Supervision of other normal pregnancy, antepartum; Previous cesarean delivery affecting pregnancy; Advanced maternal age in multigravida; and Pre-diabetes on her problem list.  Patient reports no complaints.  Contractions: Not present. Vag. Bleeding: None.   . Denies leaking of fluid.   The following portions of the patient's history were reviewed and updated as appropriate: allergies, current medications, past family history, past medical history, past social history, past surgical history and problem list. Problem list updated.  Objective:   Vitals:   07/31/17 1546  BP: 125/64  Pulse: 86  Weight: 202 lb (91.6 kg)    Fetal Status: Fetal Heart Rate (bpm): 150         General:  Alert, oriented and cooperative. Patient is in no acute distress.  Skin: Skin is warm and dry. No rash noted.   Cardiovascular: Normal heart rate noted  Respiratory: Normal respiratory effort, no problems with respiration noted  Abdomen: Soft, gravid, appropriate for gestational age.  Pain/Pressure: Absent     Pelvic: Cervical exam deferred        Extremities: Normal range of motion.  Edema: None  Mental Status:  Normal mood and affect. Normal behavior. Normal judgment and thought content.   Assessment and Plan:  Pregnancy: [id number] at [redacted]w[redacted]d  1. Supervision of other normal pregnancy, antepartum AFP today Has [place] scheduled for early next month.   2. Previous cesarean delivery affecting pregnancy  3. Pre-diabetes Reviewed diet and exercise. Pt does not have the time to see a nutritionist at this time. She was given written and verbal instruction about diet and exercise.    4. Obesity (BMI 35.0-39.9 without comorbidity) See  above  5. Depression with anxiety  6. Elderly multigravida in second trimester  Preterm labor symptoms and general obstetric precautions including but not limited to vaginal bleeding, contractions, leaking of fluid and fetal movement were reviewed in detail with the patient. Please refer to After Visit Summary for other counseling recommendations.   F/u in 4 weeks or sooner prn  Lavonia Drafts, MD

## 2017-08-16 LAB — AFP, SERUM, OPEN SPINA BIFIDA
AFP MOM: 1.6
AFP VALUE AFPOSL: 50.4 ng/mL
Gest. Age on Collection Date: 17.1 weeks
MATERNAL AGE AT EDD: 36.9 a
OSBR Risk 1 IN: 2100
Test Results:: NEGATIVE
WEIGHT: 202 [lb_av]

## 2017-08-21 ENCOUNTER — Ambulatory Visit (HOSPITAL_COMMUNITY)
Admission: RE | Admit: 2017-08-21 | Discharge: 2017-08-21 | Disposition: A | Discharge status: Home / Self Care | Payer: Federal, State, Local not specified - PPO | Source: Ambulatory Visit | Attending: Obstetrics & Gynecology | Admitting: Obstetrics & Gynecology

## 2017-08-21 ENCOUNTER — Other Ambulatory Visit: Payer: Self-pay | Admitting: Obstetrics & Gynecology

## 2017-08-21 DIAGNOSIS — E669 Obesity, unspecified: Secondary | ICD-10-CM

## 2017-08-21 DIAGNOSIS — O09522 Supervision of elderly multigravida, second trimester: Secondary | ICD-10-CM

## 2017-08-21 DIAGNOSIS — Z3A20 20 weeks gestation of pregnancy: Secondary | ICD-10-CM | POA: Insufficient documentation

## 2017-08-21 DIAGNOSIS — F418 Other specified anxiety disorders: Secondary | ICD-10-CM

## 2017-08-21 DIAGNOSIS — Z3480 Encounter for supervision of other normal pregnancy, unspecified trimester: Secondary | ICD-10-CM

## 2017-08-21 DIAGNOSIS — Z3689 Encounter for other specified antenatal screening: Secondary | ICD-10-CM | POA: Diagnosis not present

## 2017-08-21 DIAGNOSIS — O3412 Maternal care for benign tumor of corpus uteri, second trimester: Secondary | ICD-10-CM | POA: Diagnosis not present

## 2017-08-21 DIAGNOSIS — D259 Leiomyoma of uterus, unspecified: Secondary | ICD-10-CM | POA: Diagnosis not present

## 2017-09-04 ENCOUNTER — Ambulatory Visit (INDEPENDENT_AMBULATORY_CARE_PROVIDER_SITE_OTHER): Payer: Federal, State, Local not specified - PPO | Admitting: Obstetrics & Gynecology

## 2017-09-04 VITALS — BP 116/61 | HR 92 | Wt 206.0 lb

## 2017-09-04 DIAGNOSIS — O34219 Maternal care for unspecified type scar from previous cesarean delivery: Secondary | ICD-10-CM

## 2017-09-04 DIAGNOSIS — E669 Obesity, unspecified: Secondary | ICD-10-CM

## 2017-09-04 DIAGNOSIS — O09522 Supervision of elderly multigravida, second trimester: Secondary | ICD-10-CM

## 2017-09-04 DIAGNOSIS — Z3482 Encounter for supervision of other normal pregnancy, second trimester: Secondary | ICD-10-CM

## 2017-09-04 DIAGNOSIS — Z3480 Encounter for supervision of other normal pregnancy, unspecified trimester: Secondary | ICD-10-CM

## 2017-09-04 DIAGNOSIS — R7303 Prediabetes: Secondary | ICD-10-CM

## 2017-09-04 DIAGNOSIS — F418 Other specified anxiety disorders: Secondary | ICD-10-CM

## 2017-09-04 NOTE — Progress Notes (Signed)
   PRENATAL VISIT NOTE  Subjective:  Savannah Henry is a 36 y.o. [id number] at [redacted]w[redacted]d being seen today for ongoing prenatal care.  She is currently monitored for the following issues for this high-risk pregnancy and has Obesity (BMI 35.0-39.9 without comorbidity); Depression with anxiety; Supervision of other normal pregnancy, antepartum; Previous cesarean delivery affecting pregnancy; Advanced maternal age in multigravida; and Pre-diabetes on her problem list.  Patient reports no complaints.  Contractions: Not present. Vag. Bleeding: None.  Movement: Present. Denies leaking of fluid.   The following portions of the patient's history were reviewed and updated as appropriate: allergies, current medications, past family history, past medical history, past social history, past surgical history and problem list. Problem list updated.  Objective:   Vitals:   09/04/17 1539  BP: 116/61  Pulse: 92  Weight: 206 lb (93.4 kg)    Fetal Status:     Movement: Present     General:  Alert, oriented and cooperative. Patient is in no acute distress.  Skin: Skin is warm and dry. No rash noted.   Cardiovascular: Normal heart rate noted  Respiratory: Normal respiratory effort, no problems with respiration noted  Abdomen: Soft, gravid, appropriate for gestational age.  Pain/Pressure: Present     Pelvic: Cervical exam deferred        Extremities: Normal range of motion.  Edema: None  Mental Status:  Normal mood and affect. Normal behavior. Normal judgment and thought content.   Assessment and Plan:  Pregnancy: [id number] at [redacted]w[redacted]d  1. Supervision of other normal pregnancy, antepartum Will get flu vaccine  2. Previous cesarean delivery affecting pregnancy For scheduled repeat  3. Pre-diabetes Pt has adjusted her diet well and has had appropriate weight gain.  4. Obesity (BMI 35.0-39.9 without comorbidity) See above  5. Depression with anxiety stable  6. Elderly multigravida in second trimester All  genetic testing WNL  Preterm labor symptoms and general obstetric precautions including but not limited to vaginal bleeding, contractions, leaking of fluid and fetal movement were reviewed in detail with the patient. Please refer to After Visit Summary for other counseling recommendations.  Return in about 4 weeks (around 10/02/2017).   Lavonia Drafts, MD

## 2017-09-04 NOTE — Patient Instructions (Signed)

## 2017-10-04 ENCOUNTER — Ambulatory Visit (HOSPITAL_COMMUNITY)
Admission: RE | Admit: 2017-10-04 | Discharge: 2017-10-04 | Disposition: A | Discharge status: Home / Self Care | Payer: Federal, State, Local not specified - PPO | Source: Ambulatory Visit | Attending: Obstetrics & Gynecology | Admitting: Obstetrics & Gynecology

## 2017-10-04 DIAGNOSIS — O34219 Maternal care for unspecified type scar from previous cesarean delivery: Secondary | ICD-10-CM | POA: Insufficient documentation

## 2017-10-04 DIAGNOSIS — E669 Obesity, unspecified: Secondary | ICD-10-CM

## 2017-10-04 DIAGNOSIS — D259 Leiomyoma of uterus, unspecified: Secondary | ICD-10-CM | POA: Diagnosis not present

## 2017-10-04 DIAGNOSIS — Z6836 Body mass index (BMI) 36.0-36.9, adult: Secondary | ICD-10-CM | POA: Diagnosis not present

## 2017-10-04 DIAGNOSIS — Z3A27 27 weeks gestation of pregnancy: Secondary | ICD-10-CM | POA: Diagnosis not present

## 2017-10-04 DIAGNOSIS — O99212 Obesity complicating pregnancy, second trimester: Secondary | ICD-10-CM | POA: Diagnosis not present

## 2017-10-04 DIAGNOSIS — Z3482 Encounter for supervision of other normal pregnancy, second trimester: Secondary | ICD-10-CM | POA: Diagnosis not present

## 2017-10-04 DIAGNOSIS — Z3A26 26 weeks gestation of pregnancy: Secondary | ICD-10-CM | POA: Diagnosis not present

## 2017-10-04 DIAGNOSIS — Z3480 Encounter for supervision of other normal pregnancy, unspecified trimester: Secondary | ICD-10-CM

## 2017-10-04 DIAGNOSIS — O09522 Supervision of elderly multigravida, second trimester: Secondary | ICD-10-CM | POA: Diagnosis not present

## 2017-10-04 DIAGNOSIS — O3412 Maternal care for benign tumor of corpus uteri, second trimester: Secondary | ICD-10-CM | POA: Diagnosis not present

## 2017-10-04 DIAGNOSIS — Z3689 Encounter for other specified antenatal screening: Secondary | ICD-10-CM | POA: Diagnosis not present

## 2017-10-09 ENCOUNTER — Ambulatory Visit (INDEPENDENT_AMBULATORY_CARE_PROVIDER_SITE_OTHER): Payer: Federal, State, Local not specified - PPO | Admitting: Obstetrics & Gynecology

## 2017-10-09 VITALS — BP 126/66 | HR 90 | Wt 206.0 lb

## 2017-10-09 DIAGNOSIS — O09523 Supervision of elderly multigravida, third trimester: Secondary | ICD-10-CM

## 2017-10-09 DIAGNOSIS — R7303 Prediabetes: Secondary | ICD-10-CM

## 2017-10-09 DIAGNOSIS — O34219 Maternal care for unspecified type scar from previous cesarean delivery: Secondary | ICD-10-CM

## 2017-10-09 DIAGNOSIS — Z3480 Encounter for supervision of other normal pregnancy, unspecified trimester: Secondary | ICD-10-CM

## 2017-10-09 DIAGNOSIS — F418 Other specified anxiety disorders: Secondary | ICD-10-CM

## 2017-10-09 DIAGNOSIS — E669 Obesity, unspecified: Secondary | ICD-10-CM

## 2017-10-09 NOTE — Progress Notes (Signed)
Patient states rash on her arms and legs worsening. Kathrene Alu RNBSN

## 2017-10-09 NOTE — Progress Notes (Signed)
   PRENATAL VISIT NOTE  Subjective:  Savannah Henry is a 36 y.o. [id number] at [redacted]w[redacted]d being seen today for ongoing prenatal care.  She is currently monitored for the following issues for this high-risk pregnancy and has Obesity (BMI 35.0-39.9 without comorbidity); Depression with anxiety; Supervision of other normal pregnancy, antepartum; Previous cesarean delivery affecting pregnancy; Advanced maternal age in multigravida; and Pre-diabetes on her problem list.  Patient reports rash on extremitiesand buttocks  .   .  .   . Denies leaking of fluid.   The following portions of the patient's history were reviewed and updated as appropriate: allergies, current medications, past family history, past medical history, past social history, past surgical history and problem list. Problem list updated.  Objective:  There were no vitals filed for this visit.  Fetal Status:           General:  Alert, oriented and cooperative. Patient is in no acute distress.  Skin: Skin is warm and dry. No rash noted.   Cardiovascular: Normal heart rate noted  Respiratory: Normal respiratory effort, no problems with respiration noted  Abdomen: Soft, gravid, appropriate for gestational age.        Pelvic: Cervical exam deferred        Extremities: Normal range of motion.     Mental Status:  Normal mood and affect. Normal behavior. Normal judgment and thought content.   Assessment and Plan:  Pregnancy: [id number] at [redacted]w[redacted]d  1. Supervision of other normal pregnancy, antepartum  2. Previous cesarean delivery affecting pregnancy For repeat at 39 weeks  3. Pre-diabetes Pt needs 2 hours GTT  4. Obesity (BMI 35.0-39.9 without comorbidity)  5. Depression with anxiety  6. Elderly multigravida in third trimester  Preterm labor symptoms and general obstetric precautions including but not limited to vaginal bleeding, contractions, leaking of fluid and fetal movement were reviewed in detail with the patient. Please refer to  After Visit Summary for other counseling recommendations.  Return in about 2 weeks (around 10/23/2017).   Lavonia Drafts, MD

## 2017-10-15 ENCOUNTER — Other Ambulatory Visit: Payer: Self-pay

## 2017-10-17 ENCOUNTER — Other Ambulatory Visit: Payer: Federal, State, Local not specified - PPO

## 2017-10-17 DIAGNOSIS — Z3490 Encounter for supervision of normal pregnancy, unspecified, unspecified trimester: Secondary | ICD-10-CM | POA: Diagnosis not present

## 2017-10-17 NOTE — Progress Notes (Signed)
Patient presents for 2 hr gtt- sent to lab. Kathrene Alu RNBSN

## 2017-10-18 ENCOUNTER — Encounter: Payer: Self-pay | Admitting: Family Medicine

## 2017-10-18 ENCOUNTER — Other Ambulatory Visit: Payer: Self-pay | Admitting: Family Medicine

## 2017-10-18 LAB — CBC
Hematocrit: 30.2 % — ABNORMAL LOW (ref 34.0–46.6)
Hemoglobin: 9.8 g/dL — ABNORMAL LOW (ref 11.1–15.9)
MCH: 26.1 pg — ABNORMAL LOW (ref 26.6–33.0)
MCHC: 32.5 g/dL (ref 31.5–35.7)
MCV: 81 fL (ref 79–97)
PLATELETS: 257 10*3/uL (ref 150–379)
RBC: 3.75 x10E6/uL — ABNORMAL LOW (ref 3.77–5.28)
RDW: 14.5 % (ref 12.3–15.4)
WBC: 9.5 10*3/uL (ref 3.4–10.8)

## 2017-10-18 LAB — HIV ANTIBODY (ROUTINE TESTING W REFLEX): HIV SCREEN 4TH GENERATION: NONREACTIVE

## 2017-10-18 LAB — RPR: RPR Ser Ql: NONREACTIVE

## 2017-10-18 LAB — GLUCOSE TOLERANCE, 2 HOURS W/ 1HR
GLUCOSE, 1 HOUR: 146 mg/dL (ref 65–179)
GLUCOSE, 2 HOUR: 117 mg/dL (ref 65–152)
Glucose, Fasting: 85 mg/dL (ref 65–91)

## 2017-10-18 MED ORDER — FERROUS SULFATE 325 (65 FE) MG PO TABS: 325.0000 mg | ORAL_TABLET | Freq: Two times a day (BID) | ORAL | 1 refills | Status: DC

## 2017-10-23 ENCOUNTER — Encounter: Payer: Self-pay | Admitting: Obstetrics & Gynecology

## 2017-10-25 ENCOUNTER — Encounter (HOSPITAL_COMMUNITY): Payer: Self-pay

## 2017-10-25 ENCOUNTER — Ambulatory Visit (INDEPENDENT_AMBULATORY_CARE_PROVIDER_SITE_OTHER): Payer: Federal, State, Local not specified - PPO | Admitting: Family Medicine

## 2017-10-25 VITALS — BP 102/75 | HR 97 | Wt 204.0 lb

## 2017-10-25 DIAGNOSIS — O99213 Obesity complicating pregnancy, third trimester: Secondary | ICD-10-CM

## 2017-10-25 DIAGNOSIS — Z3480 Encounter for supervision of other normal pregnancy, unspecified trimester: Secondary | ICD-10-CM

## 2017-10-25 DIAGNOSIS — Z3483 Encounter for supervision of other normal pregnancy, third trimester: Secondary | ICD-10-CM

## 2017-10-25 DIAGNOSIS — E669 Obesity, unspecified: Secondary | ICD-10-CM

## 2017-10-25 DIAGNOSIS — O34219 Maternal care for unspecified type scar from previous cesarean delivery: Secondary | ICD-10-CM

## 2017-10-25 NOTE — Progress Notes (Signed)
   PRENATAL VISIT NOTE  Subjective:  Cledith Kamiya is a 36 y.o. [id number] at [redacted]w[redacted]d being seen today for ongoing prenatal care.  She is currently monitored for the following issues for this high-risk pregnancy and has Obesity (BMI 35.0-39.9 without comorbidity); Depression with anxiety; Supervision of other normal pregnancy, antepartum; Previous cesarean delivery affecting pregnancy; Advanced maternal age in multigravida; and Pre-diabetes on their problem list.  Patient reports no complaints.  Contractions: Not present. Vag. Bleeding: None.  Movement: Present. Denies leaking of fluid.   The following portions of the patient's history were reviewed and updated as appropriate: allergies, current medications, past family history, past medical history, past social history, past surgical history and problem list. Problem list updated.  Objective:   Vitals:   10/25/17 0823  BP: 102/75  Pulse: 97  Weight: 204 lb (92.5 kg)    Fetal Status: Fetal Heart Rate (bpm): 140 Fundal Height: 30 cm Movement: Present     General:  Alert, oriented and cooperative. Patient is in no acute distress.  Skin: Skin is warm and dry. No rash noted.   Cardiovascular: Normal heart rate noted  Respiratory: Normal respiratory effort, no problems with respiration noted  Abdomen: Soft, gravid, appropriate for gestational age.  Pain/Pressure: Absent     Pelvic: Cervical exam deferred        Extremities: Normal range of motion.     Mental Status:  Normal mood and affect. Normal behavior. Normal judgment and thought content.   Assessment and Plan:  Pregnancy: [id number] at [redacted]w[redacted]d  1. Supervision of other normal pregnancy, antepartum FHT and FH normal  2. Previous cesarean delivery affecting pregnancy Will schedule repeat at 39 weeks  3. Obesity (BMI 35.0-39.9 without comorbidity)  Preterm labor symptoms and general obstetric precautions including but not limited to vaginal bleeding, contractions, leaking of fluid and  fetal movement were reviewed in detail with the patient. Please refer to After Visit Summary for other counseling recommendations.  Return in about 2 weeks (around 11/08/2017).   Truett Mainland, DO

## 2017-10-28 ENCOUNTER — Other Ambulatory Visit: Payer: Self-pay | Admitting: Family Medicine

## 2017-11-06 ENCOUNTER — Ambulatory Visit (INDEPENDENT_AMBULATORY_CARE_PROVIDER_SITE_OTHER): Payer: Federal, State, Local not specified - PPO | Admitting: Obstetrics & Gynecology

## 2017-11-06 VITALS — BP 118/65 | HR 106 | Wt 206.0 lb

## 2017-11-06 DIAGNOSIS — F418 Other specified anxiety disorders: Secondary | ICD-10-CM

## 2017-11-06 DIAGNOSIS — Z3480 Encounter for supervision of other normal pregnancy, unspecified trimester: Secondary | ICD-10-CM

## 2017-11-06 DIAGNOSIS — Z3483 Encounter for supervision of other normal pregnancy, third trimester: Secondary | ICD-10-CM

## 2017-11-06 DIAGNOSIS — O34219 Maternal care for unspecified type scar from previous cesarean delivery: Secondary | ICD-10-CM

## 2017-11-06 DIAGNOSIS — O09523 Supervision of elderly multigravida, third trimester: Secondary | ICD-10-CM

## 2017-11-06 DIAGNOSIS — R7303 Prediabetes: Secondary | ICD-10-CM

## 2017-11-06 NOTE — Patient Instructions (Signed)

## 2017-11-06 NOTE — Progress Notes (Signed)
   PRENATAL VISIT NOTE  Subjective:  Savannah Henry is a 36 y.o. [id number] at [redacted]w[redacted]d being seen today for ongoing prenatal care.  She is currently monitored for the following issues for this high-risk pregnancy and has Obesity (BMI 35.0-39.9 without comorbidity); Depression with anxiety; Supervision of other normal pregnancy, antepartum; Previous cesarean delivery affecting pregnancy; Advanced maternal age in multigravida; and Pre-diabetes on their problem list.  Patient reports no complaints.  Contractions: Not present. Vag. Bleeding: None.  Movement: Present. Denies leaking of fluid.   The following portions of the patient's history were reviewed and updated as appropriate: allergies, current medications, past family history, past medical history, past social history, past surgical history and problem list. Problem list updated.  Objective:   Vitals:   11/06/17 1405  BP: 118/65  Pulse: (!) 106  Weight: 206 lb (93.4 kg)    Fetal Status: Fetal Heart Rate (bpm): 153   Movement: Present     General:  Alert, oriented and cooperative. Patient is in no acute distress.  Skin: Skin is warm and dry. No rash noted.   Cardiovascular: Normal heart rate noted  Respiratory: Normal respiratory effort, no problems with respiration noted  Abdomen: Soft, gravid, appropriate for gestational age.  Pain/Pressure: Absent     Pelvic: Cervical exam deferred        Extremities: Normal range of motion.  Edema: None  Mental Status:  Normal mood and affect. Normal behavior. Normal judgment and thought content.   Assessment and Plan:  Pregnancy: [id number] at [redacted]w[redacted]d  1. Supervision of other normal pregnancy, antepartum  2. Previous cesarean delivery affecting pregnancy Scheduled for repeat at 39 weeks  3. Pre-diabetes Pt monitoring diet  4. Depression with anxiety stable  5. Elderly multigravida in third trimester  Preterm labor symptoms and general obstetric precautions including but not limited to  vaginal bleeding, contractions, leaking of fluid and fetal movement were reviewed in detail with the patient. Please refer to After Visit Summary for other counseling recommendations.  F/u in 2 weeks or sooner prn  Lavonia Drafts, MD

## 2017-11-20 ENCOUNTER — Ambulatory Visit (INDEPENDENT_AMBULATORY_CARE_PROVIDER_SITE_OTHER): Payer: Federal, State, Local not specified - PPO | Admitting: Obstetrics and Gynecology

## 2017-11-20 ENCOUNTER — Encounter: Payer: Self-pay | Admitting: Obstetrics and Gynecology

## 2017-11-20 VITALS — BP 124/69 | HR 100 | Wt 203.0 lb

## 2017-11-20 DIAGNOSIS — E669 Obesity, unspecified: Secondary | ICD-10-CM

## 2017-11-20 DIAGNOSIS — R7303 Prediabetes: Secondary | ICD-10-CM

## 2017-11-20 DIAGNOSIS — O34219 Maternal care for unspecified type scar from previous cesarean delivery: Secondary | ICD-10-CM

## 2017-11-20 DIAGNOSIS — Z3480 Encounter for supervision of other normal pregnancy, unspecified trimester: Secondary | ICD-10-CM

## 2017-11-20 NOTE — Progress Notes (Addendum)
m[facility]  PRENATAL VISIT NOTE  Subjective:  Savannah Henry is a 36 y.o. [id number] at [redacted]w[redacted]d being seen today for ongoing prenatal care.  She is currently monitored for the following issues for this low-risk pregnancy and has Obesity (BMI 35.0-39.9 without comorbidity); Depression with anxiety; Supervision of other normal pregnancy, antepartum; Previous cesarean delivery affecting pregnancy; Advanced maternal age in multigravida; and Pre-diabetes on their problem list.  Patient reports occasional tightening.  Contractions: Not present. Vag. Bleeding: None.  Movement: Present. Denies leaking of fluid.   The following portions of the patient's history were reviewed and updated as appropriate: allergies, current medications, past family history, past medical history, past social history, past surgical history and problem list. Problem list updated.  Objective:   Vitals:   11/20/17 1602  BP: 124/69  Pulse: 100  Weight: 203 lb (92.1 kg)    Fetal Status: Fetal Heart Rate (bpm): 145 Fundal Height: 35 cm Movement: Present     General:  Alert, oriented and cooperative. Patient is in no acute distress.  Skin: Skin is warm and dry. No rash noted.   Cardiovascular: Normal heart rate noted  Respiratory: Normal respiratory effort, no problems with respiration noted  Abdomen: Soft, gravid, appropriate for gestational age.  Pain/Pressure: Absent     Pelvic: Cervical exam deferred        Extremities: Normal range of motion.  Edema: None  Mental Status:  Normal mood and affect. Normal behavior. Normal judgment and thought content.   Assessment and Plan:  Pregnancy: [id number] at [redacted]w[redacted]d  1. Previous cesarean delivery affecting pregnancy For RCS 39 weeks  2. Supervision of other normal pregnancy, antepartum Size > dates Follow up growth ordered Reviewed options for birth control including oral contraceptive pills (combination and progesterone only), NuvaRing, Depo-Provera, Nexplanon, IUDs (copper and  levonorgestrol) and permanent sterilization. Thoroughly reviewed risks/benefits/side effects of each. Answered all questions. Patient will consider. Counseled regarding risks/benefits of flu vaccine, patient declines vaccine.  Counseled regarding risks/benefits of Tdap vaccine, patient declines vaccine.   3. Obesity (BMI 35.0-39.9 without comorbidity)  4. Pre-diabetes Monitoring diet  Preterm labor symptoms and general obstetric precautions including but not limited to vaginal bleeding, contractions, leaking of fluid and fetal movement were reviewed in detail with the patient. Please refer to After Visit Summary for other counseling recommendations.  Return in about 2 weeks (around 12/04/2017) for OB visit.   Sloan Leiter, MD

## 2017-11-20 NOTE — Patient Instructions (Signed)
Contraception Choices Contraception (birth control) is the use of any methods or devices to prevent pregnancy. Below are some methods to help avoid pregnancy. Hormonal methods  Contraceptive implant. This is a thin, plastic tube containing progesterone hormone. It does not contain estrogen hormone. Your health care provider inserts the tube in the inner part of the upper arm. The tube can remain in place for up to 3 years. After 3 years, the implant must be removed. The implant prevents the ovaries from releasing an egg (ovulation), thickens the cervical mucus to prevent sperm from entering the uterus, and thins the lining of the inside of the uterus.  Progesterone-only injections. These injections are given every 3 months by your health care provider to prevent pregnancy. This synthetic progesterone hormone stops the ovaries from releasing eggs. It also thickens cervical mucus and changes the uterine lining. This makes it harder for sperm to survive in the uterus.  Birth control pills. These pills contain estrogen and progesterone hormone. They work by preventing the ovaries from releasing eggs (ovulation). They also cause the cervical mucus to thicken, preventing the sperm from entering the uterus. Birth control pills are prescribed by a health care provider.Birth control pills can also be used to treat heavy periods.  Minipill. This type of birth control pill contains only the progesterone hormone. They are taken every day of each month and must be prescribed by your health care provider.  Birth control patch. The patch contains hormones similar to those in birth control pills. It must be changed once a week and is prescribed by a health care provider.  Vaginal ring. The ring contains hormones similar to those in birth control pills. It is left in the vagina for 3 weeks, removed for 1 week, and then a new one is put back in place. The patient must be comfortable inserting and removing the ring from  the vagina.A health care provider's prescription is necessary.  Emergency contraception. Emergency contraceptives prevent pregnancy after unprotected sexual intercourse. This pill can be taken right after sex or up to 5 days after unprotected sex. It is most effective the sooner you take the pills after having sexual intercourse. Most emergency contraceptive pills are available without a prescription. Check with your pharmacist. Do not use emergency contraception as your only form of birth control. Barrier methods  Female condom. This is a thin sheath (latex or rubber) that is worn over the penis during sexual intercourse. It can be used with spermicide to increase effectiveness.  Female condom. This is a soft, loose-fitting sheath that is put into the vagina before sexual intercourse.  Diaphragm. This is a soft, latex, dome-shaped barrier that must be fitted by a health care provider. It is inserted into the vagina, along with a spermicidal jelly. It is inserted before intercourse. The diaphragm should be left in the vagina for 6 to 8 hours after intercourse.  Cervical cap. This is a round, soft, latex or plastic cup that fits over the cervix and must be fitted by a health care provider. The cap can be left in place for up to 48 hours after intercourse.  Sponge. This is a soft, circular piece of polyurethane foam. The sponge has spermicide in it. It is inserted into the vagina after wetting it and before sexual intercourse.  Spermicides. These are chemicals that kill or block sperm from entering the cervix and uterus. They come in the form of creams, jellies, suppositories, foam, or tablets. They do not require a prescription. They   are inserted into the vagina with an applicator before having sexual intercourse. The process must be repeated every time you have sexual intercourse. Intrauterine contraception  Intrauterine device (IUD). This is a T-shaped device that is put in a woman's uterus during  a menstrual period to prevent pregnancy. There are 2 types: ? Copper IUD. This type of IUD is wrapped in copper wire and is placed inside the uterus. Copper makes the uterus and fallopian tubes produce a fluid that kills sperm. It can stay in place for 10 years. ? Hormone IUD. This type of IUD contains the hormone progestin (synthetic progesterone). The hormone thickens the cervical mucus and prevents sperm from entering the uterus, and it also thins the uterine lining to prevent implantation of a fertilized egg. The hormone can weaken or kill the sperm that get into the uterus. It can stay in place for 3-5 years, depending on which type of IUD is used. Permanent methods of contraception  Female tubal ligation. This is when the woman's fallopian tubes are surgically sealed, tied, or blocked to prevent the egg from traveling to the uterus.  Hysteroscopic sterilization. This involves placing a small coil or insert into each fallopian tube. Your doctor uses a technique called hysteroscopy to do the procedure. The device causes scar tissue to form. This results in permanent blockage of the fallopian tubes, so the sperm cannot fertilize the egg. It takes about 3 months after the procedure for the tubes to become blocked. You must use another form of birth control for these 3 months.  Female sterilization. This is when the female has the tubes that carry sperm tied off (vasectomy).This blocks sperm from entering the vagina during sexual intercourse. After the procedure, the man can still ejaculate fluid (semen). Natural planning methods  Natural family planning. This is not having sexual intercourse or using a barrier method (condom, diaphragm, cervical cap) on days the woman could become pregnant.  Calendar method. This is keeping track of the length of each menstrual cycle and identifying when you are fertile.  Ovulation method. This is avoiding sexual intercourse during ovulation.  Symptothermal method.  This is avoiding sexual intercourse during ovulation, using a thermometer and ovulation symptoms.  Post-ovulation method. This is timing sexual intercourse after you have ovulated. Regardless of which type or method of contraception you choose, it is important that you use condoms to protect against the transmission of sexually transmitted infections (STIs). Talk with your health care provider about which form of contraception is most appropriate for you. This information is not intended to replace advice given to you by your health care provider. Make sure you discuss any questions you have with your health care provider. Document Released: 12/03/2005 Document Revised: 05/10/2016 Document Reviewed: 05/28/2013 Elsevier Interactive Patient Education  2017 [employer]  

## 2017-11-26 ENCOUNTER — Ambulatory Visit (HOSPITAL_COMMUNITY)
Admission: RE | Admit: 2017-11-26 | Discharge: 2017-11-26 | Disposition: A | Discharge status: Home / Self Care | Payer: Federal, State, Local not specified - PPO | Source: Ambulatory Visit | Attending: Obstetrics and Gynecology | Admitting: Obstetrics and Gynecology

## 2017-11-26 DIAGNOSIS — Z3480 Encounter for supervision of other normal pregnancy, unspecified trimester: Secondary | ICD-10-CM | POA: Diagnosis not present

## 2017-11-26 DIAGNOSIS — Z3483 Encounter for supervision of other normal pregnancy, third trimester: Secondary | ICD-10-CM | POA: Diagnosis not present

## 2017-11-26 DIAGNOSIS — D259 Leiomyoma of uterus, unspecified: Secondary | ICD-10-CM | POA: Diagnosis not present

## 2017-11-26 DIAGNOSIS — O09523 Supervision of elderly multigravida, third trimester: Secondary | ICD-10-CM | POA: Diagnosis not present

## 2017-11-26 DIAGNOSIS — Z3A34 34 weeks gestation of pregnancy: Secondary | ICD-10-CM | POA: Diagnosis not present

## 2017-11-26 DIAGNOSIS — O3413 Maternal care for benign tumor of corpus uteri, third trimester: Secondary | ICD-10-CM | POA: Diagnosis not present

## 2017-12-04 ENCOUNTER — Ambulatory Visit (INDEPENDENT_AMBULATORY_CARE_PROVIDER_SITE_OTHER): Payer: Federal, State, Local not specified - PPO | Admitting: Obstetrics & Gynecology

## 2017-12-04 VITALS — BP 122/68 | HR 92 | Wt 208.0 lb

## 2017-12-04 DIAGNOSIS — O09523 Supervision of elderly multigravida, third trimester: Secondary | ICD-10-CM

## 2017-12-04 DIAGNOSIS — Z23 Encounter for immunization: Secondary | ICD-10-CM | POA: Diagnosis not present

## 2017-12-04 DIAGNOSIS — Z3480 Encounter for supervision of other normal pregnancy, unspecified trimester: Secondary | ICD-10-CM

## 2017-12-04 DIAGNOSIS — F418 Other specified anxiety disorders: Secondary | ICD-10-CM

## 2017-12-04 DIAGNOSIS — Z3483 Encounter for supervision of other normal pregnancy, third trimester: Secondary | ICD-10-CM

## 2017-12-04 DIAGNOSIS — R7303 Prediabetes: Secondary | ICD-10-CM

## 2017-12-04 DIAGNOSIS — E669 Obesity, unspecified: Secondary | ICD-10-CM

## 2017-12-04 DIAGNOSIS — O34219 Maternal care for unspecified type scar from previous cesarean delivery: Secondary | ICD-10-CM

## 2017-12-04 NOTE — Progress Notes (Signed)
   PRENATAL VISIT NOTE  Subjective:  Ocie Stanzione is a 36 y.o. [id number] at [redacted]w[redacted]d being seen today for ongoing prenatal care.  She is currently monitored for the following issues for this high-risk pregnancy and has Obesity (BMI 35.0-39.9 without comorbidity); Depression with anxiety; Supervision of other normal pregnancy, antepartum; Previous cesarean delivery affecting pregnancy; Advanced maternal age in multigravida; and Pre-diabetes on their problem list.  Patient reports no complaints.  Contractions: Irregular. Vag. Bleeding: None.  Movement: Present. Denies leaking of fluid.   The following portions of the patient's history were reviewed and updated as appropriate: allergies, current medications, past family history, past medical history, past social history, past surgical history and problem list. Problem list updated.  Objective:   Vitals:   12/04/17 1509  BP: 122/68  Pulse: 92  Weight: 208 lb (94.3 kg)    Fetal Status:     Movement: Present     General:  Alert, oriented and cooperative. Patient is in no acute distress.  Skin: Skin is warm and dry. No rash noted.   Cardiovascular: Normal heart rate noted  Respiratory: Normal respiratory effort, no problems with respiration noted  Abdomen: Soft, gravid, appropriate for gestational age.  Pain/Pressure: Present     Pelvic: Cervical exam deferred        Extremities: Normal range of motion.     Mental Status:  Normal mood and affect. Normal behavior. Normal judgment and thought content.   Assessment and Plan:  Pregnancy: [id number] at 106w1d  1. Supervision of other normal pregnancy, antepartum  2. Previous cesarean delivery affecting pregnancy Pt is scheduled for a repeat c/s at 39 weeks  11/26/2017 Single living intrauterine pregnancy at 34+0 weeks  Appropriate fetal growth in the 83rd percentile; AC is >97th  percentile  Normal amniotic fluid volume.  Interval review of fetal anatomy is normal  3. Pre-diabetes-  antepartum   4. Obesity (BMI 35.0-39.9 without comorbidity)  5. Depression with anxiety Stable  6. Elderly multigravida in third trimester  Preterm labor symptoms and general obstetric precautions including but not limited to vaginal bleeding, contractions, leaking of fluid and fetal movement were reviewed in detail with the patient. Please refer to After Visit Summary for other counseling recommendations.  Return in about 2 weeks (around 12/18/2017).   Lavonia Drafts, MD

## 2017-12-18 ENCOUNTER — Ambulatory Visit (INDEPENDENT_AMBULATORY_CARE_PROVIDER_SITE_OTHER): Payer: Federal, State, Local not specified - PPO | Admitting: Obstetrics & Gynecology

## 2017-12-18 VITALS — BP 109/68 | HR 88 | Wt 210.0 lb

## 2017-12-18 DIAGNOSIS — O34219 Maternal care for unspecified type scar from previous cesarean delivery: Secondary | ICD-10-CM

## 2017-12-18 DIAGNOSIS — E669 Obesity, unspecified: Secondary | ICD-10-CM

## 2017-12-18 DIAGNOSIS — R7303 Prediabetes: Secondary | ICD-10-CM

## 2017-12-18 DIAGNOSIS — Z3480 Encounter for supervision of other normal pregnancy, unspecified trimester: Secondary | ICD-10-CM

## 2017-12-18 DIAGNOSIS — F418 Other specified anxiety disorders: Secondary | ICD-10-CM

## 2017-12-18 DIAGNOSIS — O09523 Supervision of elderly multigravida, third trimester: Secondary | ICD-10-CM

## 2017-12-19 ENCOUNTER — Encounter (HOSPITAL_COMMUNITY): Payer: Self-pay

## 2017-12-19 NOTE — Progress Notes (Signed)
   PRENATAL VISIT NOTE  Subjective:  Savannah Henry is a 37 y.o. [id number] at [redacted]w[redacted]d being seen today for ongoing prenatal care.  She is currently monitored for the following issues for this low-risk pregnancy and has Obesity (BMI 35.0-39.9 without comorbidity); Depression with anxiety; Supervision of other normal pregnancy, antepartum; Previous cesarean delivery affecting pregnancy; Advanced maternal age in multigravida; and Pre-diabetes on their problem list.  Patient reports pelvic pain; especially with walking or prolonged sitting.   .  Contractions: Irregular. Vag. Bleeding: None.  Movement: Present. Denies leaking of fluid.   The following portions of the patient's history were reviewed and updated as appropriate: allergies, current medications, past family history, past medical history, past social history, past surgical history and problem list. Problem list updated.  Objective:   Vitals:   12/18/17 1605  BP: 109/68  Pulse: 88  Weight: 210 lb (95.3 kg)    Fetal Status: Fetal Heart Rate (bpm): 150   Movement: Present     General:  Alert, oriented and cooperative. Patient is in no acute distress.  Skin: Skin is warm and dry. No rash noted.   Cardiovascular: Normal heart rate noted  Respiratory: Normal respiratory effort, no problems with respiration noted  Abdomen: Soft, gravid, appropriate for gestational age.  Pain/Pressure: Present     Pelvic: Cervical exam deferred        Extremities: Normal range of motion.     Mental Status:  Normal mood and affect. Normal behavior. Normal judgment and thought content.   Assessment and Plan:  Pregnancy: [id number] at [redacted]w[redacted]d  1. Supervision of other normal pregnancy, antepartum The pelvic pain is WNL for this gestation . Recommended a maternity belt but, pt has tried that she feel reassured that the pain is a normal complaint.    2. Previous cesarean delivery affecting pregnancy Pt is scheduled for a repeat at 39 weeks    3.  Pre-diabetes  4. Obesity (BMI 35.0-39.9 without comorbidity)  5. Depression with anxiety  6. Elderly multigravida in third trimester   Term labor symptoms and general obstetric precautions including but not limited to vaginal bleeding, contractions, leaking of fluid and fetal movement were reviewed in detail with the patient. Please refer to After Visit Summary for other counseling recommendations.  Return in about 1 week (around 12/25/2017).   Lavonia Drafts, MD

## 2017-12-25 ENCOUNTER — Ambulatory Visit (INDEPENDENT_AMBULATORY_CARE_PROVIDER_SITE_OTHER): Payer: Federal, State, Local not specified - PPO | Admitting: Obstetrics & Gynecology

## 2017-12-25 VITALS — BP 121/82 | HR 99 | Wt 213.0 lb

## 2017-12-25 DIAGNOSIS — O99213 Obesity complicating pregnancy, third trimester: Secondary | ICD-10-CM | POA: Diagnosis not present

## 2017-12-25 DIAGNOSIS — O09523 Supervision of elderly multigravida, third trimester: Secondary | ICD-10-CM

## 2017-12-25 DIAGNOSIS — R7303 Prediabetes: Secondary | ICD-10-CM

## 2017-12-25 DIAGNOSIS — E669 Obesity, unspecified: Secondary | ICD-10-CM

## 2017-12-25 DIAGNOSIS — O34219 Maternal care for unspecified type scar from previous cesarean delivery: Secondary | ICD-10-CM

## 2017-12-25 DIAGNOSIS — Z3483 Encounter for supervision of other normal pregnancy, third trimester: Secondary | ICD-10-CM

## 2017-12-25 DIAGNOSIS — F418 Other specified anxiety disorders: Secondary | ICD-10-CM

## 2017-12-25 DIAGNOSIS — Z3480 Encounter for supervision of other normal pregnancy, unspecified trimester: Secondary | ICD-10-CM

## 2017-12-26 ENCOUNTER — Encounter: Payer: Self-pay | Admitting: Obstetrics & Gynecology

## 2017-12-26 NOTE — Progress Notes (Signed)
   PRENATAL VISIT NOTE  Subjective:  Savannah Henry is a 37 y.o. [id number] at [redacted]w[redacted]d being seen today for ongoing prenatal care.  She is currently monitored for the following issues for this high-risk pregnancy and has Obesity (BMI 35.0-39.9 without comorbidity); Depression with anxiety; Supervision of other normal pregnancy, antepartum; Previous cesarean delivery affecting pregnancy; Advanced maternal age in multigravida; and Pre-diabetes on their problem list.  Patient reports decreased fetal movement Contractions: Not present.  .  Movement: (!) Decreased. Denies leaking of fluid.   The following portions of the patient's history were reviewed and updated as appropriate: allergies, current medications, past family history, past medical history, past social history, past surgical history and problem list. Problem list updated.  Objective:   Vitals:   12/25/17 1535  BP: 121/82  Pulse: 99  Weight: 213 lb (96.6 kg)    Fetal Status:     Movement: (!) Decreased  Presentation: Vertex  General:  Alert, oriented and cooperative. Patient is in no acute distress.  Skin: Skin is warm and dry. No rash noted.   Cardiovascular: Normal heart rate noted  Respiratory: Normal respiratory effort, no problems with respiration noted  Abdomen: Soft, gravid, appropriate for gestational age.  Pain/Pressure: Present     Pelvic: Cervical exam deferred        Extremities: Normal range of motion.  Edema: None  Mental Status:  Normal mood and affect. Normal behavior. Normal judgment and thought content.   Assessment and Plan:  Pregnancy: [id number] at [redacted]w[redacted]d  1. Supervision of other normal pregnancy, antepartum NST reviewed and reactive.  2. Previous cesarean delivery affecting pregnancy Scheduled for 1/15. Orders in system .  Reviewed pre op instructions  3. Pre-diabetes  4. Obesity (BMI 35.0-39.9 without comorbidity)  5. Depression with anxiety stable  6. Elderly multigravida in third  trimester   Term labor symptoms and general obstetric precautions including but not limited to vaginal bleeding, contractions, leaking of fluid and fetal movement were reviewed in detail with the patient. Please refer to After Visit Summary for other counseling recommendations.  No Follow-up on file.   Lavonia Drafts, MD

## 2017-12-30 ENCOUNTER — Encounter (HOSPITAL_COMMUNITY)
Admission: RE | Admit: 2017-12-30 | Discharge: 2017-12-30 | Disposition: A | Discharge status: Home / Self Care | Payer: Federal, State, Local not specified - PPO | Source: Ambulatory Visit | Attending: Family Medicine | Admitting: Family Medicine

## 2017-12-30 DIAGNOSIS — O9902 Anemia complicating childbirth: Secondary | ICD-10-CM | POA: Diagnosis not present

## 2017-12-30 DIAGNOSIS — Z3A39 39 weeks gestation of pregnancy: Secondary | ICD-10-CM | POA: Diagnosis not present

## 2017-12-30 DIAGNOSIS — O34211 Maternal care for low transverse scar from previous cesarean delivery: Secondary | ICD-10-CM | POA: Diagnosis not present

## 2017-12-30 DIAGNOSIS — Z302 Encounter for sterilization: Secondary | ICD-10-CM | POA: Diagnosis not present

## 2017-12-30 DIAGNOSIS — D649 Anemia, unspecified: Secondary | ICD-10-CM | POA: Diagnosis not present

## 2017-12-30 LAB — CBC
HEMATOCRIT: 32.5 % — AB (ref 36.0–46.0)
Hemoglobin: 10.6 g/dL — ABNORMAL LOW (ref 12.0–15.0)
MCH: 26.4 pg (ref 26.0–34.0)
MCHC: 32.6 g/dL (ref 30.0–36.0)
MCV: 81 fL (ref 78.0–100.0)
PLATELETS: 236 10*3/uL (ref 150–400)
RBC: 4.01 MIL/uL (ref 3.87–5.11)
RDW: 14.8 % (ref 11.5–15.5)
WBC: 8.6 10*3/uL (ref 4.0–10.5)

## 2017-12-30 LAB — TYPE AND SCREEN
ABO/RH(D): O POS
ANTIBODY SCREEN: NEGATIVE

## 2017-12-30 NOTE — Patient Instructions (Signed)
Savannah Henry  12/30/2017   Your procedure is scheduled on:  12/31/2017  Enter through the Main Entrance of [facility] up the phone at the desk and dial [phone]  Call this number if you have problems the morning of surgery:[phone]  Remember:   Do not eat food:After Midnight.  Do not drink clear liquids: After Midnight.  Take these medicines the morning of surgery with A SIP OF WATER: none.  Please bring your inhaler if you have one.   Do not wear jewelry, make-up or nail polish.  Do not wear lotions, powders, or perfumes. Do not wear deodorant.  Do not shave 48 hours prior to surgery.  Do not bring valuables to the hospital.  [facility] is not   responsible for any belongings or valuables brought to the hospital.  Contacts, dentures or bridgework may not be worn into surgery.  Leave suitcase in the car. After surgery it may be brought to your room.  For patients admitted to the hospital, checkout time is 11:00 AM the day of              discharge.    N/A   Please read over the following fact sheets that you were given:   Surgical Site Infection Prevention

## 2017-12-31 ENCOUNTER — Encounter (HOSPITAL_COMMUNITY): Payer: Self-pay | Admitting: *Deleted

## 2017-12-31 ENCOUNTER — Inpatient Hospital Stay (HOSPITAL_COMMUNITY): Payer: Federal, State, Local not specified - PPO | Admitting: Anesthesiology

## 2017-12-31 ENCOUNTER — Inpatient Hospital Stay (HOSPITAL_COMMUNITY)
Admission: RE | Admit: 2017-12-31 | Discharge: 2018-01-02 | DRG: 785 | Disposition: A | Discharge status: Home / Self Care | Payer: Federal, State, Local not specified - PPO | Source: Ambulatory Visit | Attending: Family Medicine | Admitting: Family Medicine

## 2017-12-31 ENCOUNTER — Other Ambulatory Visit: Payer: Self-pay

## 2017-12-31 ENCOUNTER — Encounter (HOSPITAL_COMMUNITY)
Admission: RE | Disposition: A | Discharge status: Home / Self Care | Payer: Self-pay | Source: Ambulatory Visit | Attending: Family Medicine

## 2017-12-31 DIAGNOSIS — Z3A39 39 weeks gestation of pregnancy: Secondary | ICD-10-CM

## 2017-12-31 DIAGNOSIS — O9902 Anemia complicating childbirth: Secondary | ICD-10-CM | POA: Diagnosis present

## 2017-12-31 DIAGNOSIS — Z302 Encounter for sterilization: Secondary | ICD-10-CM

## 2017-12-31 DIAGNOSIS — Z98891 History of uterine scar from previous surgery: Secondary | ICD-10-CM

## 2017-12-31 DIAGNOSIS — D649 Anemia, unspecified: Secondary | ICD-10-CM | POA: Diagnosis present

## 2017-12-31 DIAGNOSIS — O34211 Maternal care for low transverse scar from previous cesarean delivery: Principal | ICD-10-CM | POA: Diagnosis present

## 2017-12-31 LAB — RPR: RPR: NONREACTIVE

## 2017-12-31 SURGERY — Surgical Case
Anesthesia: Spinal | Site: Abdomen | Wound class: Clean Contaminated

## 2017-12-31 MED ORDER — MENTHOL 3 MG MT LOZG: 1.0000 | LOZENGE | OROMUCOSAL | Status: DC | PRN

## 2017-12-31 MED ORDER — SODIUM CHLORIDE 0.9 % IR SOLN
Status: DC | PRN
  Administered 2017-12-31: 1

## 2017-12-31 MED ORDER — NALBUPHINE HCL 10 MG/ML IJ SOLN
5.0000 mg | Freq: Once | INTRAMUSCULAR | Status: AC | PRN
  Administered 2017-12-31: 5 mg via SUBCUTANEOUS

## 2017-12-31 MED ORDER — TETANUS-DIPHTH-ACELL PERTUSSIS 5-2.5-18.5 LF-MCG/0.5 IM SUSP: 0.5000 mL | Freq: Once | INTRAMUSCULAR | Status: DC

## 2017-12-31 MED ORDER — DEXAMETHASONE SODIUM PHOSPHATE 10 MG/ML IJ SOLN
INTRAMUSCULAR | Status: DC | PRN
  Administered 2017-12-31: 5 mg via INTRAVENOUS

## 2017-12-31 MED ORDER — DIPHENHYDRAMINE HCL 25 MG PO CAPS
25.0000 mg | ORAL_CAPSULE | Freq: Four times a day (QID) | ORAL | Status: DC | PRN
  Administered 2017-12-31: 25 mg via ORAL

## 2017-12-31 MED ORDER — ONDANSETRON HCL 4 MG/2ML IJ SOLN
INTRAMUSCULAR | Status: AC
  Filled 2017-12-31: qty 2

## 2017-12-31 MED ORDER — ONDANSETRON HCL 4 MG/2ML IJ SOLN: 4.0000 mg | Freq: Three times a day (TID) | INTRAMUSCULAR | Status: DC | PRN

## 2017-12-31 MED ORDER — LACTATED RINGERS IV SOLN
125.0000 mL/h | INTRAVENOUS | Status: DC
  Administered 2017-12-31: 125 mL/h via INTRAVENOUS
  Administered 2017-12-31 (×2): via INTRAVENOUS

## 2017-12-31 MED ORDER — OXYTOCIN 40 UNITS IN LACTATED RINGERS INFUSION - SIMPLE MED
2.5000 [IU]/h | INTRAVENOUS | Status: AC
  Administered 2017-12-31: 2.5 [IU]/h via INTRAVENOUS

## 2017-12-31 MED ORDER — OXYCODONE HCL 5 MG PO TABS
5.0000 mg | ORAL_TABLET | ORAL | Status: DC | PRN
  Administered 2018-01-01 – 2018-01-02 (×3): 5 mg via ORAL
  Filled 2017-12-31 (×3): qty 1

## 2017-12-31 MED ORDER — LACTATED RINGERS IV SOLN
INTRAVENOUS | Status: DC
  Administered 2017-12-31 – 2018-01-01 (×2): via INTRAVENOUS

## 2017-12-31 MED ORDER — FENTANYL CITRATE (PF) 100 MCG/2ML IJ SOLN
INTRAMUSCULAR | Status: DC | PRN
  Administered 2017-12-31: 10 ug via INTRATHECAL

## 2017-12-31 MED ORDER — OXYTOCIN 10 UNIT/ML IJ SOLN
INTRAVENOUS | Status: DC | PRN
  Administered 2017-12-31: 40 [IU] via INTRAVENOUS

## 2017-12-31 MED ORDER — SODIUM CHLORIDE 0.9% FLUSH: 3.0000 mL | INTRAVENOUS | Status: DC | PRN

## 2017-12-31 MED ORDER — PHENYLEPHRINE 8 MG IN D5W 100 ML (0.08MG/ML) PREMIX OPTIME
INJECTION | INTRAVENOUS | Status: DC | PRN
  Administered 2017-12-31: 60 ug/min via INTRAVENOUS

## 2017-12-31 MED ORDER — NALBUPHINE HCL 10 MG/ML IJ SOLN: 5.0000 mg | Freq: Once | INTRAMUSCULAR | Status: AC | PRN

## 2017-12-31 MED ORDER — NALBUPHINE HCL 10 MG/ML IJ SOLN: 5.0000 mg | INTRAMUSCULAR | Status: DC | PRN

## 2017-12-31 MED ORDER — NALBUPHINE HCL 10 MG/ML IJ SOLN
INTRAMUSCULAR | Status: AC
  Filled 2017-12-31: qty 1

## 2017-12-31 MED ORDER — SIMETHICONE 80 MG PO CHEW
80.0000 mg | CHEWABLE_TABLET | Freq: Three times a day (TID) | ORAL | Status: DC
  Administered 2017-12-31 – 2018-01-02 (×5): 80 mg via ORAL
  Filled 2017-12-31 (×5): qty 1

## 2017-12-31 MED ORDER — CEFAZOLIN SODIUM-DEXTROSE 2-4 GM/100ML-% IV SOLN
2.0000 g | INTRAVENOUS | Status: AC
  Administered 2017-12-31: 2 g via INTRAVENOUS
  Filled 2017-12-31: qty 100

## 2017-12-31 MED ORDER — IBUPROFEN 600 MG PO TABS
600.0000 mg | ORAL_TABLET | Freq: Four times a day (QID) | ORAL | Status: DC
  Administered 2017-12-31 – 2018-01-02 (×8): 600 mg via ORAL
  Filled 2017-12-31 (×8): qty 1

## 2017-12-31 MED ORDER — ZOLPIDEM TARTRATE 5 MG PO TABS: 5.0000 mg | ORAL_TABLET | Freq: Every evening | ORAL | Status: DC | PRN

## 2017-12-31 MED ORDER — KETOROLAC TROMETHAMINE 30 MG/ML IJ SOLN
30.0000 mg | Freq: Four times a day (QID) | INTRAMUSCULAR | Status: AC | PRN
  Administered 2017-12-31: 30 mg via INTRAMUSCULAR

## 2017-12-31 MED ORDER — SCOPOLAMINE 1 MG/3DAYS TD PT72
1.0000 | MEDICATED_PATCH | Freq: Once | TRANSDERMAL | Status: DC
  Administered 2017-12-31: 1.5 mg via TRANSDERMAL
  Filled 2017-12-31: qty 1

## 2017-12-31 MED ORDER — ACETAMINOPHEN 325 MG PO TABS
650.0000 mg | ORAL_TABLET | ORAL | Status: DC | PRN
  Administered 2018-01-01: 650 mg via ORAL
  Filled 2017-12-31: qty 2

## 2017-12-31 MED ORDER — MORPHINE SULFATE (PF) 0.5 MG/ML IJ SOLN
INTRAMUSCULAR | Status: DC | PRN
  Administered 2017-12-31: .2 mg via INTRATHECAL

## 2017-12-31 MED ORDER — NALOXONE HCL 0.4 MG/ML IJ SOLN
0.4000 mg | INTRAMUSCULAR | Status: DC | PRN
Start: 2017-12-31 — End: 2018-01-02

## 2017-12-31 MED ORDER — OXYCODONE HCL 5 MG PO TABS: 10.0000 mg | ORAL_TABLET | ORAL | Status: DC | PRN

## 2017-12-31 MED ORDER — DIPHENHYDRAMINE HCL 25 MG PO CAPS
25.0000 mg | ORAL_CAPSULE | ORAL | Status: DC | PRN
  Administered 2018-01-01: 25 mg via ORAL
  Filled 2017-12-31 (×3): qty 1

## 2017-12-31 MED ORDER — OXYTOCIN 10 UNIT/ML IJ SOLN
INTRAMUSCULAR | Status: AC
  Filled 2017-12-31: qty 4

## 2017-12-31 MED ORDER — MEPERIDINE HCL 25 MG/ML IJ SOLN: 6.2500 mg | INTRAMUSCULAR | Status: DC | PRN

## 2017-12-31 MED ORDER — LACTATED RINGERS IV SOLN
INTRAVENOUS | Status: DC | PRN
  Administered 2017-12-31: 10:00:00 via INTRAVENOUS

## 2017-12-31 MED ORDER — SENNOSIDES-DOCUSATE SODIUM 8.6-50 MG PO TABS
2.0000 | ORAL_TABLET | ORAL | Status: DC
  Administered 2018-01-01 (×2): 2 via ORAL
  Filled 2017-12-31 (×2): qty 2

## 2017-12-31 MED ORDER — KETOROLAC TROMETHAMINE 30 MG/ML IJ SOLN
INTRAMUSCULAR | Status: AC
  Filled 2017-12-31: qty 1

## 2017-12-31 MED ORDER — DIPHENHYDRAMINE HCL 50 MG/ML IJ SOLN
12.5000 mg | INTRAMUSCULAR | Status: DC | PRN
Start: 2017-12-31 — End: 2018-01-02

## 2017-12-31 MED ORDER — FENTANYL CITRATE (PF) 100 MCG/2ML IJ SOLN
INTRAMUSCULAR | Status: AC
  Filled 2017-12-31: qty 2

## 2017-12-31 MED ORDER — COCONUT OIL OIL
1.0000 "application " | TOPICAL_OIL | Status: DC | PRN
  Filled 2017-12-31: qty 120

## 2017-12-31 MED ORDER — KETOROLAC TROMETHAMINE 30 MG/ML IJ SOLN: 30.0000 mg | Freq: Four times a day (QID) | INTRAMUSCULAR | Status: AC | PRN

## 2017-12-31 MED ORDER — BUPIVACAINE IN DEXTROSE 0.75-8.25 % IT SOLN
INTRATHECAL | Status: DC | PRN
  Administered 2017-12-31: 1.6 mL via INTRATHECAL

## 2017-12-31 MED ORDER — SIMETHICONE 80 MG PO CHEW
80.0000 mg | CHEWABLE_TABLET | ORAL | Status: DC
  Administered 2018-01-01 (×2): 80 mg via ORAL
  Filled 2017-12-31 (×2): qty 1

## 2017-12-31 MED ORDER — ACETAMINOPHEN 500 MG PO TABS
1000.0000 mg | ORAL_TABLET | Freq: Four times a day (QID) | ORAL | Status: AC
  Administered 2017-12-31 – 2018-01-01 (×4): 1000 mg via ORAL
  Filled 2017-12-31 (×4): qty 2

## 2017-12-31 MED ORDER — ONDANSETRON HCL 4 MG/2ML IJ SOLN
INTRAMUSCULAR | Status: DC | PRN
  Administered 2017-12-31: 4 mg via INTRAVENOUS

## 2017-12-31 MED ORDER — MORPHINE SULFATE (PF) 0.5 MG/ML IJ SOLN
INTRAMUSCULAR | Status: AC
  Filled 2017-12-31: qty 10

## 2017-12-31 MED ORDER — WITCH HAZEL-GLYCERIN EX PADS: 1.0000 "application " | MEDICATED_PAD | CUTANEOUS | Status: DC | PRN

## 2017-12-31 MED ORDER — NALOXONE HCL 4 MG/10ML IJ SOLN: 1.0000 ug/kg/h | INTRAVENOUS | Status: DC | PRN

## 2017-12-31 MED ORDER — DIBUCAINE 1 % RE OINT: 1.0000 "application " | TOPICAL_OINTMENT | RECTAL | Status: DC | PRN

## 2017-12-31 MED ORDER — PRENATAL MULTIVITAMIN CH
1.0000 | ORAL_TABLET | Freq: Every day | ORAL | Status: DC
  Administered 2018-01-01 – 2018-01-02 (×2): 1 via ORAL
  Filled 2017-12-31 (×2): qty 1

## 2017-12-31 MED ORDER — SIMETHICONE 80 MG PO CHEW: 80.0000 mg | CHEWABLE_TABLET | ORAL | Status: DC | PRN

## 2017-12-31 MED ORDER — DEXAMETHASONE SODIUM PHOSPHATE 10 MG/ML IJ SOLN
INTRAMUSCULAR | Status: AC
Start: 2017-12-31 — End: 2017-12-31
  Filled 2017-12-31: qty 1

## 2017-12-31 MED ORDER — FENTANYL CITRATE (PF) 100 MCG/2ML IJ SOLN: 25.0000 ug | INTRAMUSCULAR | Status: DC | PRN

## 2017-12-31 SURGICAL SUPPLY — 35 items
APL SKNCLS STERI-STRIP NONHPOA (GAUZE/BANDAGES/DRESSINGS) ×2
BENZOIN TINCTURE PRP APPL 2/3 (GAUZE/BANDAGES/DRESSINGS) ×3 IMPLANT
CHLORAPREP W/TINT 26ML (MISCELLANEOUS) ×3 IMPLANT
CLAMP CORD UMBIL (MISCELLANEOUS) IMPLANT
CLOSURE STERI STRIP 1/2 X4 (GAUZE/BANDAGES/DRESSINGS) ×2 IMPLANT
CLOTH BEACON ORANGE TIMEOUT ST (SAFETY) ×3 IMPLANT
DRSG OPSITE POSTOP 4X10 (GAUZE/BANDAGES/DRESSINGS) ×3 IMPLANT
ELECT REM PT RETURN 9FT ADLT (ELECTROSURGICAL) ×3
ELECTRODE REM PT RTRN 9FT ADLT (ELECTROSURGICAL) ×2 IMPLANT
EXTRACTOR VACUUM M CUP 4 TUBE (SUCTIONS) IMPLANT
GAUZE SPONGE 4X4 12PLY STRL LF (GAUZE/BANDAGES/DRESSINGS) ×2 IMPLANT
GLOVE BIOGEL PI IND STRL 7.0 (GLOVE) ×4 IMPLANT
GLOVE BIOGEL PI IND STRL 7.5 (GLOVE) ×4 IMPLANT
GLOVE BIOGEL PI INDICATOR 7.0 (GLOVE) ×2
GLOVE BIOGEL PI INDICATOR 7.5 (GLOVE) ×2
GLOVE ECLIPSE 7.5 STRL STRAW (GLOVE) ×3 IMPLANT
GOWN STRL REUS W/TWL LRG LVL3 (GOWN DISPOSABLE) ×9 IMPLANT
KIT ABG SYR 3ML LUER SLIP (SYRINGE) IMPLANT
NDL HYPO 25X5/8 SAFETYGLIDE (NEEDLE) IMPLANT
NEEDLE HYPO 25X5/8 SAFETYGLIDE (NEEDLE) IMPLANT
NS IRRIG 1000ML POUR BTL (IV SOLUTION) ×3 IMPLANT
PACK C SECTION WH (CUSTOM PROCEDURE TRAY) ×3 IMPLANT
PAD ABD 7.5X8 STRL (GAUZE/BANDAGES/DRESSINGS) ×2 IMPLANT
PAD OB MATERNITY 4.3X12.25 (PERSONAL CARE ITEMS) ×3 IMPLANT
PENCIL SMOKE EVAC W/HOLSTER (ELECTROSURGICAL) ×3 IMPLANT
RTRCTR C-SECT PINK 25CM LRG (MISCELLANEOUS) ×3 IMPLANT
STRIP CLOSURE SKIN 1/2X4 (GAUZE/BANDAGES/DRESSINGS) ×3 IMPLANT
SUT PLAIN 0 NONE (SUTURE) ×4 IMPLANT
SUT VIC AB 0 CTX 36 (SUTURE) ×9
SUT VIC AB 0 CTX36XBRD ANBCTRL (SUTURE) ×6 IMPLANT
SUT VIC AB 2-0 CT1 27 (SUTURE) ×3
SUT VIC AB 2-0 CT1 TAPERPNT 27 (SUTURE) ×2 IMPLANT
SUT VIC AB 4-0 KS 27 (SUTURE) ×3 IMPLANT
TOWEL OR 17X24 6PK STRL BLUE (TOWEL DISPOSABLE) ×3 IMPLANT
TRAY FOLEY BAG SILVER LF 14FR (SET/KITS/TRAYS/PACK) ×3 IMPLANT

## 2017-12-31 NOTE — Anesthesia Procedure Notes (Signed)
Spinal  Patient location during procedure: OR Start time: 12/31/2017 9:32 AM End time: 12/31/2017 9:36 AM Staffing Anesthesiologist: Suzette Battiest, MD Performed: anesthesiologist  Preanesthetic Checklist Completed: patient identified, site marked, surgical consent, pre-op evaluation, timeout performed, IV checked, risks and benefits discussed and monitors and equipment checked Spinal Block Patient position: sitting Prep: site prepped and draped and DuraPrep Patient monitoring: blood pressure, continuous pulse ox and heart rate Approach: midline Location: L4-5 Injection technique: single-shot Needle Needle type: Pencan  Needle gauge: 24 G Needle length: 9 cm Assessment Sensory level: T4

## 2017-12-31 NOTE — Anesthesia Postprocedure Evaluation (Signed)
Anesthesia Post Note  Patient: Development worker, community  Procedure(s) Performed: CESAREAN SECTION WITH BILATERAL TUBAL LIGATION (N/A Abdomen)     Patient location during evaluation: PACU Anesthesia Type: Spinal Level of consciousness: awake and alert Pain management: pain level controlled Vital Signs Assessment: post-procedure vital signs reviewed and stable Respiratory status: spontaneous breathing and respiratory function stable Cardiovascular status: blood pressure returned to baseline and stable Postop Assessment: spinal receding Anesthetic complications: no    Last Vitals:  Vitals:   12/31/17 1057 12/31/17 1240  BP: 105/76 120/73  Pulse: 73 67  Resp: 18 16  Temp: 36.7 C 36.9 C  SpO2: 100% 99%    Last Pain:  Vitals:   12/31/17 1240  TempSrc: Axillary  PainSc: 0-No pain   Pain Goal: Patients Stated Pain Goal: 8 (12/31/17 0751)               Tiajuana Amass

## 2017-12-31 NOTE — Transfer of Care (Signed)
Immediate Anesthesia Transfer of Care Note  Patient: Development worker, community  Procedure(s) Performed: CESAREAN SECTION WITH BILATERAL TUBAL LIGATION (N/A Abdomen)  Patient Location: PACU  Anesthesia Type:Spinal  Level of Consciousness: awake and alert   Airway & Oxygen Therapy: Patient Spontanous Breathing  Post-op Assessment: Report given to RN and Post -op Vital signs reviewed and stable  Post vital signs: Reviewed and stable  Last Vitals:  Vitals:   12/31/17 0747 12/31/17 1057  BP: 130/81   Pulse: 96   Resp: 18   Temp: 37 C (P) 36.7 C    Last Pain:  Vitals:   12/31/17 1057  TempSrc: (P) Oral      Patients Stated Pain Goal: 8 (29/52/84 1324)  Complications: No apparent anesthesia complications and cardiovascular complications

## 2017-12-31 NOTE — H&P (Signed)
Obstetric Preoperative History and Physical  Savannah Henry is a 37 y.o. [id number] with IUP at [redacted]w[redacted]d presenting for scheduled cesarean section.  No acute concerns. Patient desires BTL.  Prenatal Course Source of Care: [facility]  with onset of care at 9 weeks Pregnancy complications or risks: Patient Active Problem List   Diagnosis Date Noted  . Pre-diabetes 07/07/2017  . Supervision of other normal pregnancy, antepartum 06/05/2017  . Previous cesarean delivery affecting pregnancy 06/05/2017  . Advanced maternal age in multigravida 06/05/2017  . Depression with anxiety 10/09/2012  . Obesity (BMI 35.0-39.9 without comorbidity) 08/11/2012   Sono: 34w, Normal AFI, normal anatomy, EFW 2719gm, 6lb, 60%, cephalic presentation, posterior placenta  She plans to breastfeed She desires bilateral tubal ligation for postpartum contraception.   Prenatal labs and studies: ABO, Rh: --/--/O POS (01/14 1025) Antibody: NEG (01/14 1025) Rubella: 10.80 (06/20 1645) RPR: Non Reactive (01/14 1025)  HBsAg: Negative (06/20 1645)  HIV: Non Reactive (11/01 0848)  GBS: Unknown 2 hr Glucola  normal Genetic screening normal Anatomy US normal  Prenatal Transfer Tool  Maternal Diabetes: No Genetic Screening: Normal Maternal Ultrasounds/Referrals: Normal Fetal Ultrasounds or other Referrals:  None Maternal Substance Abuse:  No Significant Maternal Medications:  None Significant Maternal Lab Results: None  Past Medical History:  Diagnosis Date  . Anxiety   . Asthma    no meds needed, maybe 1-2 times per year  . Chicken pox   . Dysrhythmia    palpitations with pregnancy  . GERD (gastroesophageal reflux disease)    no meds  . Hyperlipidemia   . Obesity   . PONV (postoperative nausea and vomiting)   . Vaginal Pap smear, abnormal 2000    Past Surgical History:  Procedure Laterality Date  . CESAREAN SECTION  2005  . CESAREAN SECTION N/A 08/06/2013   Procedure: REPEAT CESAREAN SECTION;  Surgeon:  Shelly Bombard, MD;  Location: [facility];  Service: Obstetrics;  Laterality: N/A;  . CHOLECYSTECTOMY  2007  . DILATION AND CURETTAGE OF UTERUS  12/18/2015   pt reported miscarriage  . EXCISION METACARPAL MASS Right 09/21/2013   Procedure: RIGHT PALM EXCISION MASS;  Surgeon: Tennis Must, MD;  Location: [facility];  Service: Orthopedics;  Laterality: Right;    OB History  Gravida Para Term Preterm AB Living  5 2 2   2 2   SAB TAB Ectopic Multiple Live Births  2       2    # Outcome Date GA Lbr Len/2nd Weight Sex Delivery Anes PTL Lv  5 Current           4 Term 08/06/13 [redacted]w[redacted]d   F CS-Vac Spinal  LIV  3 Term 2005    F CS-LTranv EPI  LIV  2 SAB           1 SAB               Social History   Socioeconomic History  . Marital status: Single    Spouse name: None  . Number of children: 2  . Years of education: None  . Highest education level: None  Social Needs  . Financial resource strain: None  . Food insecurity - worry: None  . Food insecurity - inability: None  . Transportation needs - medical: None  . Transportation needs - non-medical: None  Occupational History  . Occupation: Freight forwarder  Tobacco Use  . Smoking status: Never Smoker  . Smokeless tobacco: Never Used  Substance and Sexual Activity  .  Alcohol use: No  . Drug use: No  . Sexual activity: Yes    Partners: Male    Birth control/protection: None  Other Topics Concern  . None  Social History Narrative   Works for [employer]   Daughter- 2005   Daughter-2014   Engaged   Enjoys spending itme with her family, friends    Family History  Problem Relation Age of Onset  . Arthritis Mother   . Breast cancer Mother 11  . ALS Father   . Breast cancer Maternal Aunt   . Lung cancer Maternal Uncle   . Diabetes Paternal Aunt   . Arthritis Maternal Grandmother   . Breast cancer Maternal Grandmother   . Heart disease Paternal Grandmother   . Hypertension Paternal Grandmother   . Diabetes Paternal  Grandfather     Medications Prior to Admission  Medication Sig Dispense Refill Last Dose  . ferrous sulfate (FERROUSUL) 325 (65 FE) MG tablet Take 1 tablet (325 mg total) by mouth 2 (two) times daily. (Patient taking differently: Take 325 mg by mouth See admin instructions. Take 325 mg by mouth 5 times weekly) 60 tablet 1 Taking  . Prenatal Vit-Fe Fum-FA-Omega (ONE-A-DAY WOMENS PRENATAL PO) Take 1 tablet by mouth daily.   Taking  . albuterol (PROVENTIL HFA;VENTOLIN HFA) 108 (90 BASE) MCG/ACT inhaler Inhale 2 puffs into the lungs every 6 (six) hours as needed for wheezing or shortness of breath. (Patient not taking: Reported on 12/25/2017) 1 Inhaler 0 Not Taking    Allergies  Allergen Reactions  . Other Nausea And Vomiting and Other (See Comments)    Anesthesia     Review of Systems: Negative except for what is mentioned in HPI.  Physical Exam: BP 130/81   Pulse 96   Temp 98.6 F (37 C) (Oral)   Resp 18   Ht 5\' 3"  (1.6 m)   Wt 213 lb (96.6 kg)   LMP 02/08/2017   BMI 37.73 kg/m  FHR by Doppler: 130 bpm CONSTITUTIONAL: Well-developed, well-nourished female in no acute distress.  HENT:  Normocephalic, atraumatic, External right and left ear normal. Oropharynx is clear and moist EYES: Conjunctivae and EOM are normal. Pupils are equal, round, and reactive to light. No scleral icterus.  NECK: Normal range of motion, supple, no masses SKIN: Skin is warm and dry. No rash noted. Not diaphoretic. No erythema. No pallor. [place]: Alert and oriented to person, place, and time. Normal reflexes, muscle tone coordination. No cranial nerve deficit noted. PSYCHIATRIC: Normal mood and affect. Normal behavior. Normal judgment and thought content. CARDIOVASCULAR: Normal heart rate noted, regular rhythm RESPIRATORY: Effort and breath sounds normal, no problems with respiration noted ABDOMEN: Soft, nontender, nondistended, gravid. Well-healed Pfannenstiel incision. PELVIC: Deferred MUSCULOSKELETAL:  Normal range of motion. No edema and no tenderness. 2+ distal pulses.   Pertinent Labs/Studies:   Results for orders placed or performed during the hospital encounter of 12/30/17 (from the past 72 hour(s))  CBC     Status: Abnormal   Collection Time: 12/30/17 10:25 AM  Result Value Ref Range   WBC 8.6 4.0 - 10.5 K/uL   RBC 4.01 3.87 - 5.11 MIL/uL   Hemoglobin 10.6 (L) 12.0 - 15.0 g/dL   HCT 32.5 (L) 36.0 - 46.0 %   MCV 81.0 78.0 - 100.0 fL   MCH 26.4 26.0 - 34.0 pg   MCHC 32.6 30.0 - 36.0 g/dL   RDW 14.8 11.5 - 15.5 %   Platelets 236 150 - 400 K/uL  RPR  Status: None   Collection Time: 12/30/17 10:25 AM  Result Value Ref Range   RPR Ser Ql Non Reactive Non Reactive    Comment: (NOTE) Performed At: [facility] [place], [place] [id number] Rush Farmer MD CW:[id number]   Type and screen     Status: None   Collection Time: 12/30/17 10:25 AM  Result Value Ref Range   ABO/RH(D) O POS    Antibody Screen NEG    Sample Expiration 01/02/2018     Assessment and Plan :Jearlean Demauro is a 37 y.o. [id number] at [redacted]w[redacted]d being admitted for scheduled cesarean section. The risks of cesarean section discussed with the patient included but were not limited to: bleeding which may require transfusion or reoperation; infection which may require antibiotics; injury to bowel, bladder, ureters or other surrounding organs; injury to the fetus; need for additional procedures including hysterectomy in the event of a life-threatening hemorrhage; placental abnormalities wth subsequent pregnancies, incisional problems, thromboembolic phenomenon and other postoperative/anesthesia complications.   Patient desires permanent sterilization.  Other reversible forms of contraception were discussed with patient; she declines all other modalities. Risks of procedure discussed with patient including but not limited to: risk of regret, permanence of method, bleeding, infection, injury to surrounding  organs and need for additional procedures.  Failure risk of 1-2 % with increased risk of ectopic gestation if pregnancy occurs was also discussed with patient.  Patient verbalized understanding of these risks and wants to proceed with sterilization.    The patient concurred with the proposed plan, giving informed written consent for the procedure. Patient has been NPO since last night she will remain NPO for procedure. Anesthesia and OR aware. Preoperative prophylactic antibiotics and SCDs ordered on call to the OR. To OR when ready.   Luiz Blare, DO OB Fellow

## 2017-12-31 NOTE — Op Note (Signed)
Savannah Henry PROCEDURE DATE: 12/31/2017  PREOPERATIVE DIAGNOSIS: Intrauterine pregnancy at  [redacted]w[redacted]d weeks gestation; elective repeat c-section, undesired fertility  POSTOPERATIVE DIAGNOSIS: The same  PROCEDURE: Repeat Low Transverse Cesarean Section  SURGEON:  Dr. Loma Boston  ASSISTANT: Dr. Luiz Blare  INDICATIONS: Savannah Henry is a 37 y.o. [id number] at [redacted]w[redacted]d scheduled for cesarean section secondary to elective repeat.  The risks of cesarean section discussed with the patient included but were not limited to: bleeding which may require transfusion or reoperation; infection which may require antibiotics; injury to bowel, bladder, ureters or other surrounding organs; injury to the fetus; need for additional procedures including hysterectomy in the event of a life-threatening hemorrhage; placental abnormalities wth subsequent pregnancies, incisional problems, thromboembolic phenomenon and other postoperative/anesthesia complications. The patient concurred with the proposed plan, giving informed written consent for the procedure.    FINDINGS:  Viable female infant in cephalic presentation.  Apgars 9 and 9.  Clear amniotic fluid.  Intact placenta, three vessel cord.  Normal uterus, fallopian tubes and ovaries bilaterally.  ANESTHESIA:  Spinal INTRAVENOUS FLUIDS: 2000 ml ESTIMATED BLOOD LOSS: 626 ml URINE OUTPUT:  200 ml SPECIMENS: Placenta sent to L&D COMPLICATIONS: None immediate  PROCEDURE IN DETAIL:  The patient received intravenous antibiotics and had sequential compression devices applied to her lower extremities while in the preoperative area.  She was then taken to the operating room where spinal anesthesia was administered and was found to be adequate. She was then placed in a dorsal supine position with a leftward tilt, and prepped and draped in a sterile manner.  A foley catheter was placed into her bladder and attached to constant gravity, which drained clear fluid throughout.  After  an adequate timeout was performed, a Pfannenstiel skin incision was made with scalpel and carried through to the underlying layer of fascia. The fascia was incised in the midline and this incision was extended bilaterally using the Mayo scissors. Kocher clamps were applied to the superior aspect of the fascial incision and the underlying rectus muscles were dissected off bluntly. A similar process was carried out on the inferior aspect of the facial incision. The rectus muscles were separated in the midline bluntly and the peritoneum was entered bluntly. An Alexis retractor was placed to aid in visualization of the uterus.  Attention was turned to the lower uterine segment where a transverse hysterotomy was made with a scalpel and extended bilaterally bluntly. The infant was successfully delivered, and cord was clamped and cut after 1 minute and infant was handed over to awaiting neonatology team. Uterine massage was then administered and the placenta delivered intact with three-vessel cord. The uterus was then cleared of clot and debris.  The hysterotomy was closed with 0 Vicryl in a running locked fashion.    Attention was then turned to the fallopian tubes. The right Fallopian tube was visualized and then traced to it's fimbriae and an avascular midsection of the tube approximately 3-4cm from the cornua was grasped with the Babcock clamps and brought into a knuckle. The tube was double ligated with 3-0 plain gut suture. Another free tie of 3-0 plain gut was tied below the first suture and the intervening portion of tube was transected and removed. The free ends were then bovied with good hemostasis.   Attention was then turned to the right fallopian tube, after confirmation of identification by tracing the tube out to the fimbriae. The same procedure was then performed on the right Fallopian tube  allowing for bilateral tubal sterilization.  Overall, excellent hemostasis was noted. The abdomen and the pelvis  were cleared of all clot and debris and the Ubaldo Glassing was removed. Hemostasis was confirmed on all surfaces.  The peritoneum was reapproximated using 2-0 vicryl interrupted stitches. The fascia was then closed using 0 Vicryl in a running fashion. The skin was closed with 4-0 vicryl. The patient tolerated the procedure well. Sponge, lap, instrument and needle counts were correct x 2. She was taken to the recovery room in stable condition.   Savannah Shams, DO 12/31/2017 10:46 AM

## 2017-12-31 NOTE — Anesthesia Preprocedure Evaluation (Addendum)
Anesthesia Evaluation  Patient identified by MRN, date of birth, ID band Patient awake    Reviewed: Allergy & Precautions, NPO status , Patient's Chart, lab work & pertinent test results  History of Anesthesia Complications (+) PONV  Airway Mallampati: II  TM Distance: >3 FB Neck ROM: Full    Dental   Pulmonary asthma ,    breath sounds clear to auscultation       Cardiovascular negative cardio ROS   Rhythm:Regular Rate:Normal     Neuro/Psych Anxiety negative neurological ROS     GI/Hepatic Neg liver ROS, GERD  ,  Endo/Other  negative endocrine ROS  Renal/GU negative Renal ROS     Musculoskeletal   Abdominal   Peds  Hematology  (+) anemia ,   Anesthesia Other Findings   Reproductive/Obstetrics (+) Pregnancy                            Lab Results  Component Value Date   WBC 8.6 12/30/2017   HGB 10.6 (L) 12/30/2017   HCT 32.5 (L) 12/30/2017   MCV 81.0 12/30/2017   PLT 236 12/30/2017   Lab Results  Component Value Date   CREATININE 0.66 12/21/2016   BUN 8 12/21/2016   NA 138 12/21/2016   K 3.4 (L) 12/21/2016   CL 104 12/21/2016   CO2 28 12/21/2016    Anesthesia Physical Anesthesia Plan  ASA: II  Anesthesia Plan: Spinal   Post-op Pain Management:    Induction:   PONV Risk Score and Plan: 3 and Treatment may vary due to age or medical condition, Ondansetron, Dexamethasone and Scopolamine patch - Pre-op  Airway Management Planned: Natural Airway  Additional Equipment:   Intra-op Plan:   Post-operative Plan:   Informed Consent: I have reviewed the patients History and Physical, chart, labs and discussed the procedure including the risks, benefits and alternatives for the proposed anesthesia with the patient or authorized representative who has indicated his/her understanding and acceptance.     Plan Discussed with: CRNA  Anesthesia Plan Comments:          Anesthesia Quick Evaluation

## 2018-01-01 ENCOUNTER — Encounter (HOSPITAL_COMMUNITY): Payer: Self-pay | Admitting: Family Medicine

## 2018-01-01 LAB — CBC
HCT: 25.6 % — ABNORMAL LOW (ref 36.0–46.0)
HEMOGLOBIN: 8.6 g/dL — AB (ref 12.0–15.0)
MCH: 27.2 pg (ref 26.0–34.0)
MCHC: 33.6 g/dL (ref 30.0–36.0)
MCV: 81 fL (ref 78.0–100.0)
PLATELETS: 200 10*3/uL (ref 150–400)
RBC: 3.16 MIL/uL — AB (ref 3.87–5.11)
RDW: 14.7 % (ref 11.5–15.5)
WBC: 9.5 10*3/uL (ref 4.0–10.5)

## 2018-01-01 MED ORDER — FERROUS SULFATE 325 (65 FE) MG PO TABS
325.0000 mg | ORAL_TABLET | Freq: Two times a day (BID) | ORAL | Status: DC
  Administered 2018-01-01 – 2018-01-02 (×3): 325 mg via ORAL
  Filled 2018-01-01 (×3): qty 1

## 2018-01-01 MED ORDER — PNEUMOCOCCAL VAC POLYVALENT 25 MCG/0.5ML IJ INJ
0.5000 mL | INJECTION | INTRAMUSCULAR | Status: DC
  Filled 2018-01-01 (×2): qty 0.5

## 2018-01-01 NOTE — Progress Notes (Signed)
Savannah Henry was referred for history of depression/anxiety. * Referral screened out by Clinical Social Worker because none of the following criteria appear to apply: ~ History of anxiety/depression during this pregnancy, or of post-partum depression. ~ Diagnosis of anxiety and/or depression within last 3 years OR * Savannah Henry's symptoms currently being treated with medication and/or therapy. Please contact the Clinical Social Worker if needs arise, by [facility] request, or if Savannah Henry scores greater than 9/yes to question 10 on Edinburgh Postpartum Depression Screen.  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

## 2018-01-01 NOTE — Anesthesia Postprocedure Evaluation (Signed)
Anesthesia Post Note  Patient: Development worker, community  Procedure(s) Performed: CESAREAN SECTION WITH BILATERAL TUBAL LIGATION (N/A Abdomen)     Patient location during evaluation: Mother Baby Anesthesia Type: Spinal Level of consciousness: awake and alert Pain management: pain level controlled Vital Signs Assessment: post-procedure vital signs reviewed and stable Respiratory status: spontaneous breathing, nonlabored ventilation and respiratory function stable Cardiovascular status: stable Postop Assessment: no headache, no backache, epidural receding, no apparent nausea or vomiting, patient able to bend at knees and adequate PO intake Anesthetic complications: no    Last Vitals:  Vitals:   01/01/18 0412 01/01/18 0600  BP: (!) 98/53   Pulse: 68 82  Resp:    Temp: 37.2 C   SpO2: 98% 98%    Last Pain:  Vitals:   01/01/18 0725  TempSrc:   PainSc: 0-No pain   Pain Goal: Patients Stated Pain Goal: 8 (12/31/17 0751)               Jabier Mutton

## 2018-01-01 NOTE — Addendum Note (Signed)
Addendum  created 01/01/18 0904 by Hewitt Blade, CRNA   Sign clinical note

## 2018-01-01 NOTE — Lactation Note (Signed)
This note was copied from a baby's chart. Lactation Consultation Note  Patient Name: Savannah Henry Today's Date: 01/01/2018   P3, Ex BF.  Bf second child for 2 years. Mother complaining of nipple soreness.  Crack across R nipple w/ some bleeding.. Per Truddie Coco MD baby has tongue tie which will be watched. Mother has baby latched in football hold with lips flanged. RN getting her coconut oil.  Suggest shells but she does not have nursing bra with her. Encouraged tummy time when baby is awake or on chest. Mother did not want to unlatch baby for [facility] to perform oral assessment. Provided mother w/ comfort gels to alternate w/ coconut oil.      Maternal Data    Feeding Feeding Type: Breast Fed  LATCH Score Latch: Repeated attempts needed to sustain latch, nipple held in mouth throughout feeding, stimulation needed to elicit sucking reflex.  Audible Swallowing: Spontaneous and intermittent  Type of Nipple: Everted at rest and after stimulation  Comfort (Breast/Nipple): Filling, red/small blisters or bruises, mild/mod discomfort  Hold (Positioning): Assistance needed to correctly position infant at breast and maintain latch.  LATCH Score: 7  Interventions Interventions: Breast feeding basics reviewed;Assisted with latch;Hand express;Adjust position;Support pillows;Position options;Expressed milk;Coconut oil  Lactation Tools Discussed/Used     Consult Status      Carlye Grippe 01/01/2018, 9:18 PM

## 2018-01-01 NOTE — Progress Notes (Signed)
Subjective: Postpartum Day 1: Cesarean Delivery & BTL Eating, drinking well. Foley just recently removed, hasn't ambulated or voided yet. No flatus yet.  Lochia and pain wnl.  Denies dizziness, lightheadedness, or sob. No complaints.     Objective: Vital signs in last 24 hours: Temp:  [98 F (36.7 C)-99 F (37.2 C)] 99 F (37.2 C) (01/16 0412) Pulse Rate:  [64-96] 68 (01/16 0412) Resp:  [16-18] 18 (01/16 0000) BP: (98-130)/(49-81) 98/53 (01/16 0412) SpO2:  [96 %-100 %] 98 % (01/16 0412) Weight:  [96.6 kg (213 lb)] 96.6 kg (213 lb) (01/15 0751)  Physical Exam:  General: alert, cooperative and no distress Lochia: appropriate Uterine Fundus: firm Incision: healing well, no significant drainage, no dehiscence, no significant erythema DVT Evaluation: No evidence of DVT seen on physical exam. Negative Homan's sign. No cords or calf tenderness. No significant calf/ankle edema.  Recent Labs    12/30/17 1025  HGB 10.6*  HCT 32.5*    Assessment/Plan: Status post Cesarean section. Doing well postoperatively.  Continue current care.  Tawnya Crook 01/01/2018, 6:25 AM

## 2018-01-01 NOTE — Lactation Note (Signed)
This note was copied from a baby's chart. Lactation Consultation Note  Patient Name: Savannah Henry GSUPJ'S Date: 01/01/2018 Reason for consult: Initial assessment;Term Breastfeeding consultation services and support information given and reviewed.  This is mom's second baby and newborn is 72 hours old.  Mom states baby is feeding well and she denies concerns or questions.  Instructed to feed baby with cues.  Discussed cluster feeding.  Encouraged to call for assist or with concerns.  Maternal Data Does the patient have breastfeeding experience prior to this delivery?: Yes  Feeding    LATCH Score                   Interventions    Lactation Tools Discussed/Used     Consult Status Consult Status: Follow-up Date: 01/02/18 Follow-up type: In-patient    Ave Filter 01/01/2018, 2:29 PM

## 2018-01-02 ENCOUNTER — Other Ambulatory Visit: Payer: Self-pay

## 2018-01-02 ENCOUNTER — Encounter (HOSPITAL_COMMUNITY): Payer: Self-pay | Admitting: *Deleted

## 2018-01-02 MED ORDER — IBUPROFEN 600 MG PO TABS: 600.0000 mg | ORAL_TABLET | Freq: Four times a day (QID) | ORAL | 0 refills | Status: DC

## 2018-01-02 MED ORDER — OXYCODONE HCL 5 MG PO TABS: 5.0000 mg | ORAL_TABLET | ORAL | 0 refills | Status: DC | PRN

## 2018-01-02 NOTE — Discharge Summary (Signed)
OB Discharge Summary     Patient Name: Savannah Henry DOB: 04-11-1981 MRN: [id number]  Date of admission: 12/31/2017 Delivering MD: Truett Mainland   Date of discharge: 01/02/2018  Admitting diagnosis: RCS Intrauterine pregnancy: [redacted]w[redacted]d     Secondary diagnosis:  Active Problems:   Status post repeat low transverse cesarean section      Discharge diagnosis: Term Pregnancy Delivered                                                                                                Post partum procedures:n/a  Augmentation: n/a  Complications: None  Hospital course:  Sceduled C/S   37 y.o. yo [id number] at [redacted]w[redacted]d was admitted to the hospital 12/31/2017 for scheduled cesarean section with the following indication:Elective Repeat.  Membrane Rupture Time/Date: 10:04 AM ,12/31/2017   Patient delivered a Viable infant.12/31/2017  Details of operation can be found in separate operative note.  Pateint had an uncomplicated postpartum course.  She is ambulating, tolerating a regular diet, passing flatus, and urinating well. Patient is discharged home in stable condition on  01/02/18         Physical exam  Vitals:   01/01/18 0600 01/01/18 0900 01/01/18 1733 01/02/18 0500  BP:  98/64 (!) 108/53 108/66  Pulse: 82 78 79 82  Resp:  18 17 16   Temp:  98.3 F (36.8 C) 98.5 F (36.9 C) 98.6 F (37 C)  TempSrc:  Oral Oral Oral  SpO2: 98% 98%    Weight:      Height:       General: alert, cooperative and no distress Lochia: appropriate Uterine Fundus: firm Incision: Healing well with no significant drainage, No significant erythema, Dressing is clean, dry, and intact DVT Evaluation: No evidence of DVT seen on physical exam. No significant calf/ankle edema. Labs: Lab Results  Component Value Date   WBC 9.5 01/01/2018   HGB 8.6 (L) 01/01/2018   HCT 25.6 (L) 01/01/2018   MCV 81.0 01/01/2018   PLT 200 01/01/2018   CMP Latest Ref Rng & Units 12/21/2016  Glucose 70 - 99 mg/dL 101(H)  BUN 6 - 23  mg/dL 8  Creatinine 0.40 - 1.20 mg/dL 0.66  Sodium 135 - 145 mEq/L 138  Potassium 3.5 - 5.1 mEq/L 3.4(L)  Chloride 96 - 112 mEq/L 104  CO2 19 - 32 mEq/L 28  Calcium 8.4 - 10.5 mg/dL 8.9  Total Protein 6.0 - 8.3 g/dL 7.4  Total Bilirubin 0.2 - 1.2 mg/dL 0.3  Alkaline Phos 39 - 117 U/L 106  AST 0 - 37 U/L 19  ALT 0 - 35 U/L 21    Discharge instruction: per After Visit Summary and "Baby and Me Booklet".  After visit meds:  Allergies as of 01/02/2018      Reactions   Other Nausea And Vomiting, Other (See Comments)   Anesthesia       Medication List    STOP taking these medications   ONE-A-DAY WOMENS PRENATAL PO     TAKE these medications   albuterol 108 (90 Base) MCG/ACT inhaler Commonly known as:  PROVENTIL HFA;VENTOLIN  HFA Inhale 2 puffs into the lungs every 6 (six) hours as needed for wheezing or shortness of breath.   ferrous sulfate 325 (65 FE) MG tablet Commonly known as:  FERROUSUL Take 1 tablet (325 mg total) by mouth 2 (two) times daily. What changed:    when to take this  additional instructions   ibuprofen 600 MG tablet Commonly known as:  ADVIL,MOTRIN Take 1 tablet (600 mg total) by mouth every 6 (six) hours.   oxyCODONE 5 MG immediate release tablet Commonly known as:  Oxy IR/ROXICODONE Take 1 tablet (5 mg total) by mouth every 4 (four) hours as needed (pain scale 4-7).       Diet: routine diet  Activity: Advance as tolerated. Pelvic rest for 6 weeks.   Outpatient follow up:2 weeks Follow up Appt: Future Appointments  Date Time Provider [place]  01/15/2018  1:30 PM Lavonia Drafts, MD [facility] None  02/05/2018  3:45 PM Lavonia Drafts, MD [facility] None   Follow up Visit:No Follow-up on file.  Postpartum contraception: Tubal Ligation  Newborn Data: Live born female  Birth Weight: 8 lb 3 oz (3715 g) APGAR: 89, 70  Newborn Delivery   Birth date/time:  12/31/2017 10:05:00 Delivery type:  C-Section, Low  Transverse C-section categorization:  Repeat     Baby Feeding: Breast Disposition:home with mother   01/02/2018 Larwance Rote, MD  OB FELLOW DISCHARGE ATTESTATION  I have seen and examined this patient and agree with above documentation in the resident's note.   Gailen Shelter, MD OB Fellow 9:24 PM

## 2018-01-02 NOTE — Lactation Note (Signed)
This note was copied from a baby's chart. Lactation Consultation Note  Patient Name: Savannah Henry [id number] Date: 01/02/2018 Reason for consult: Follow-up assessment  47 hours 1 w female who is being exclusively BF by his mother. Mom was concern about tongue tied, Pediatrician spoke to parents about it. Mom was set up with a pump already, had to resize her pump flanges from 24 to 27 mm. Upon entering the the room baby was feeding at the breast but latch was poor, had to break the suction and reposition baby on the other breast. Mom is still sore, noticed nipples had smalls scabs upon examination, mom already got comfort gels and coconut oil, reminded mom that the first line of defense against sore nipples is mother's colostrum.  Baby was able to sustain latch with assistance, mom was instructed to pump after feedings at least 8 times/day and to spoon feed baby all the volume they get. She'll continue putting baby on the breast as long as she's not in pain. Reviewed pump parts, cleaning, milk storage. Dad was also very involved and observed feedings, he also asked questions.  Maternal Data    Feeding Feeding Type: Breast Fed  LATCH Score Latch: Grasps breast easily, tongue down, lips flanged, rhythmical sucking.  Audible Swallowing: A few with stimulation  Type of Nipple: Everted at rest and after stimulation  Comfort (Breast/Nipple): Filling, red/small blisters or bruises, mild/mod discomfort  Hold (Positioning): Assistance needed to correctly position infant at breast and maintain latch.  LATCH Score: 7  Interventions Interventions: Breast feeding basics reviewed;Assisted with latch;Skin to skin;Breast massage;Breast compression;Adjust position;Support pillows;Position options;Expressed milk;DEBP;Coconut oil;Comfort gels  Lactation Tools Discussed/Used Tools: Pump;Comfort gels   Consult Status Consult Status: Follow-up Date: 01/03/18 Follow-up type:  In-patient    [name] 01/02/2018, 9:15 AM

## 2018-01-02 NOTE — Discharge Instructions (Signed)

## 2018-01-06 ENCOUNTER — Telehealth: Payer: Self-pay

## 2018-01-06 ENCOUNTER — Inpatient Hospital Stay (HOSPITAL_COMMUNITY): Payer: Federal, State, Local not specified - PPO

## 2018-01-06 ENCOUNTER — Inpatient Hospital Stay (HOSPITAL_COMMUNITY)
Admission: AD | Admit: 2018-01-06 | Discharge: 2018-01-06 | Disposition: A | Discharge status: Home / Self Care | Payer: Federal, State, Local not specified - PPO | Source: Ambulatory Visit | Attending: Obstetrics and Gynecology | Admitting: Obstetrics and Gynecology

## 2018-01-06 ENCOUNTER — Other Ambulatory Visit: Payer: Self-pay

## 2018-01-06 DIAGNOSIS — R600 Localized edema: Secondary | ICD-10-CM

## 2018-01-06 DIAGNOSIS — Z9049 Acquired absence of other specified parts of digestive tract: Secondary | ICD-10-CM | POA: Insufficient documentation

## 2018-01-06 DIAGNOSIS — E785 Hyperlipidemia, unspecified: Secondary | ICD-10-CM | POA: Insufficient documentation

## 2018-01-06 DIAGNOSIS — K219 Gastro-esophageal reflux disease without esophagitis: Secondary | ICD-10-CM | POA: Insufficient documentation

## 2018-01-06 DIAGNOSIS — E669 Obesity, unspecified: Secondary | ICD-10-CM | POA: Insufficient documentation

## 2018-01-06 DIAGNOSIS — J45909 Unspecified asthma, uncomplicated: Secondary | ICD-10-CM | POA: Insufficient documentation

## 2018-01-06 DIAGNOSIS — R51 Headache: Secondary | ICD-10-CM | POA: Diagnosis not present

## 2018-01-06 DIAGNOSIS — F419 Anxiety disorder, unspecified: Secondary | ICD-10-CM | POA: Insufficient documentation

## 2018-01-06 DIAGNOSIS — R0602 Shortness of breath: Secondary | ICD-10-CM | POA: Diagnosis not present

## 2018-01-06 DIAGNOSIS — Z79899 Other long term (current) drug therapy: Secondary | ICD-10-CM | POA: Insufficient documentation

## 2018-01-06 DIAGNOSIS — R609 Edema, unspecified: Secondary | ICD-10-CM | POA: Insufficient documentation

## 2018-01-06 MED ORDER — IOPAMIDOL (ISOVUE-370) INJECTION 76%
100.0000 mL | Freq: Once | INTRAVENOUS | Status: AC | PRN
  Administered 2018-01-06: 100 mL via INTRAVENOUS

## 2018-01-06 MED ORDER — HYDROCHLOROTHIAZIDE 25 MG PO TABS: 25.0000 mg | ORAL_TABLET | Freq: Every day | ORAL | 0 refills | Status: DC

## 2018-01-06 MED ORDER — HYDROCHLOROTHIAZIDE 25 MG PO TABS
25.0000 mg | ORAL_TABLET | Freq: Every day | ORAL | 0 refills | Status: DC
Start: 2018-01-06 — End: 2018-01-06

## 2018-01-06 MED ORDER — LACTATED RINGERS IV SOLN
INTRAVENOUS | Status: DC
  Administered 2018-01-06: 20:00:00 via INTRAVENOUS

## 2018-01-06 MED ORDER — IBUPROFEN 600 MG PO TABS
600.0000 mg | ORAL_TABLET | Freq: Once | ORAL | Status: AC
  Administered 2018-01-06: 600 mg via ORAL
  Filled 2018-01-06: qty 1

## 2018-01-06 NOTE — Discharge Instructions (Signed)

## 2018-01-06 NOTE — Telephone Encounter (Signed)
Patient called complaining of shortness of breath and feeling weak. Patient is six days postpartum. Patient states she feels like her feet are very swollen.  Patient made aware that there are no doctors in office this afternoon and we would like her to go straight to [facility] hospital to be evaluated. Patient does state someone can drive her. Patient agreeable to go to [facility] hospital for evaluation. Kathrene Alu RNBSN

## 2018-01-06 NOTE — MAU Note (Signed)
Pt reports persistent shortness of breath for 2 days, headache, and increased swelling in her feet

## 2018-01-06 NOTE — MAU Provider Note (Signed)
History     CSN: [id number]  Arrival date and time: 01/06/18 5027   First Provider Initiated Contact with Patient 01/06/18 1853     Chief Complaint  Patient presents with  . Headache  . Shortness of Breath   HPI Savannah Henry is a 37 y.o. [id number] postpartum from a cesarean section on 12/31/17 who presents with shortness of breath. She states for the last 2 days she feels like she cannot take a deep breath and she cannot sleep because she feels like she can't breathe when she lays down. She also reports an increase of swelling in her legs. She reports a headache that she rates a 4/10 and tried ibuprofen and percocet with minimal relief.   OB History    Gravida Para Term Preterm AB Living   5 2 2   2 2    SAB TAB Ectopic Multiple Live Births   2       2      Past Medical History:  Diagnosis Date  . Anxiety   . Asthma    no meds needed, maybe 1-2 times per year  . Chicken pox   . Dysrhythmia    palpitations with pregnancy  . GERD (gastroesophageal reflux disease)    no meds  . Hyperlipidemia   . Obesity   . PONV (postoperative nausea and vomiting)   . Vaginal Pap smear, abnormal 2000    Past Surgical History:  Procedure Laterality Date  . CESAREAN SECTION  2005  . CESAREAN SECTION N/A 08/06/2013   Procedure: REPEAT CESAREAN SECTION;  Surgeon: Shelly Bombard, MD;  Location: [facility];  Service: Obstetrics;  Laterality: N/A;  . CESAREAN SECTION WITH BILATERAL TUBAL LIGATION N/A 12/31/2017   Procedure: CESAREAN SECTION WITH BILATERAL TUBAL LIGATION;  Surgeon: Truett Mainland, DO;  Location: [facility];  Service: Obstetrics;  Laterality: N/A;  . CHOLECYSTECTOMY  2007  . DILATION AND CURETTAGE OF UTERUS  12/18/2015   pt reported miscarriage  . EXCISION METACARPAL MASS Right 09/21/2013   Procedure: RIGHT PALM EXCISION MASS;  Surgeon: Tennis Must, MD;  Location: [facility];  Service: Orthopedics;  Laterality: Right;    Family History  Problem  Relation Age of Onset  . Arthritis Mother   . Breast cancer Mother 21  . ALS Father   . Breast cancer Maternal Aunt   . Lung cancer Maternal Uncle   . Diabetes Paternal Aunt   . Arthritis Maternal Grandmother   . Breast cancer Maternal Grandmother   . Heart disease Paternal Grandmother   . Hypertension Paternal Grandmother   . Diabetes Paternal Grandfather     Social History   Tobacco Use  . Smoking status: Never Smoker  . Smokeless tobacco: Never Used  Substance Use Topics  . Alcohol use: No  . Drug use: No    Allergies:  Allergies  Allergen Reactions  . Other Nausea And Vomiting and Other (See Comments)    Anesthesia     Medications Prior to Admission  Medication Sig Dispense Refill Last Dose  . albuterol (PROVENTIL HFA;VENTOLIN HFA) 108 (90 BASE) MCG/ACT inhaler Inhale 2 puffs into the lungs every 6 (six) hours as needed for wheezing or shortness of breath. (Patient not taking: Reported on 12/25/2017) 1 Inhaler 0 Not Taking  . ferrous sulfate (FERROUSUL) 325 (65 FE) MG tablet Take 1 tablet (325 mg total) by mouth 2 (two) times daily. (Patient taking differently: Take 325 mg by mouth See admin instructions. Take 325 mg  by mouth 5 times weekly) 60 tablet 1 Taking  . ibuprofen (ADVIL,MOTRIN) 600 MG tablet Take 1 tablet (600 mg total) by mouth every 6 (six) hours. 60 tablet 0   . oxyCODONE (OXY IR/ROXICODONE) 5 MG immediate release tablet Take 1 tablet (5 mg total) by mouth every 4 (four) hours as needed (pain scale 4-7). 12 tablet 0     Review of Systems  Constitutional: Negative.  Negative for fatigue and fever.  HENT: Negative.   Respiratory: Positive for shortness of breath.   Cardiovascular: Negative.  Negative for chest pain.  Gastrointestinal: Negative.  Negative for abdominal pain, constipation, diarrhea, nausea and vomiting.  Genitourinary: Negative.  Negative for dysuria.  Neurological: Negative.  Negative for dizziness and headaches.   Physical Exam   Blood  pressure 129/83, pulse 64, temperature 98.2 F (36.8 C), temperature source Oral, resp. rate 18, height 5\' 3"  (1.6 m), weight 207 lb (93.9 kg), SpO2 100 %, unknown if currently breastfeeding.  Physical Exam  Nursing note and vitals reviewed. Constitutional: She is oriented to person, place, and time. She appears well-developed and well-nourished. No distress.  HENT:  Head: Normocephalic.  Eyes: Pupils are equal, round, and reactive to light.  Cardiovascular: Normal rate, regular rhythm and normal heart sounds.  Respiratory: Effort normal. No respiratory distress. She has decreased breath sounds in the right lower field and the left lower field. She has no wheezes. She has no rales.  GI: Soft. Bowel sounds are normal. She exhibits no distension. There is no tenderness.  Neurological: She is alert and oriented to person, place, and time.  Skin: Skin is warm and dry.  Psychiatric: She has a normal mood and affect. Her behavior is normal. Judgment and thought content normal.    MAU Course  Procedures No results found for this or any previous visit (from the past 24 hour(s)).   MDM CT Chest w Contrast CBC CMP LR infusion  Care turned over to J. Rasch NP at 36. Patient waiting for CT scan, report given to Darrol Poke CNM who resumes care of the patient.   Imaging:  Ct Angio Chest Pe W Or Wo Contrast  Result Date: 01/06/2018 CLINICAL DATA:  Shortness of breath for 2 days. Headache and lower extremity swelling. EXAM: CT ANGIOGRAPHY CHEST WITH CONTRAST TECHNIQUE: Multidetector CT imaging of the chest was performed using the standard protocol during bolus administration of intravenous contrast. Multiplanar CT image reconstructions and MIPs were obtained to evaluate the vascular anatomy. CONTRAST:  100 ml ISOVUE-370 IOPAMIDOL (ISOVUE-370) INJECTION 76% COMPARISON:  None. FINDINGS: Cardiovascular: No pulmonary embolus is identified. Heart size is normal. No pericardial effusion. No calcific  atherosclerosis. Mediastinum/Nodes: No enlarged mediastinal, hilar, or axillary lymph nodes. Thyroid gland, trachea, and esophagus demonstrate no significant findings. Lungs/Pleura: Lungs are clear. No pleural effusion or pneumothorax. Upper Abdomen: Negative. Musculoskeletal: Negative. Review of the MIP images confirms the above findings. IMPRESSION: Negative for pulmonary embolus.  Negative chest CT. Electronically Signed   By: Inge Rise M.D.   On: 01/06/2018 21:28   CBC and CMP unable to be drawn after multiple tries d/t pt being a hard stick   C/w Dr Elly Modena on assessment finding and normalcy of CT results. Recommends HCTZ 25mg  daily for increased edema of bilateral legs until PP visit. Okay to discharge home.  Pt discharged. Patient stable prior to discharge. Pt denies SOB at time of discharge. Follow up as scheduled in the office and return to MAU as needed for worsening symptoms.  Assessment and Plan   1. Shortness of breath   2. Edema extremities    Follow up as scheduled in the office  Return to MAU as needed for worsening symptoms  Rx for HCTZ given to patient   Allergies as of 01/06/2018      Reactions   Other Nausea And Vomiting, Other (See Comments)   Anesthesia       Medication List    TAKE these medications   albuterol 108 (90 Base) MCG/ACT inhaler Commonly known as:  PROVENTIL HFA;VENTOLIN HFA Inhale 2 puffs into the lungs every 6 (six) hours as needed for wheezing or shortness of breath.   ferrous sulfate 325 (65 FE) MG tablet Commonly known as:  FERROUSUL Take 1 tablet (325 mg total) by mouth 2 (two) times daily. What changed:    when to take this  additional instructions   hydrochlorothiazide 25 MG tablet Commonly known as:  HYDRODIURIL Take 1 tablet (25 mg total) by mouth daily.   ibuprofen 600 MG tablet Commonly known as:  ADVIL,MOTRIN Take 1 tablet (600 mg total) by mouth every 6 (six) hours.   oxyCODONE 5 MG immediate release  tablet Commonly known as:  Oxy IR/ROXICODONE Take 1 tablet (5 mg total) by mouth every 4 (four) hours as needed (pain scale 4-7).      Lajean Manes, CNM 01/06/18, 11:11 PM

## 2018-01-15 ENCOUNTER — Ambulatory Visit: Payer: Self-pay | Admitting: Obstetrics & Gynecology

## 2018-01-16 ENCOUNTER — Ambulatory Visit (INDEPENDENT_AMBULATORY_CARE_PROVIDER_SITE_OTHER): Payer: Federal, State, Local not specified - PPO | Admitting: Family Medicine

## 2018-01-16 VITALS — BP 128/74 | HR 94 | Ht 63.0 in | Wt 190.0 lb

## 2018-01-16 DIAGNOSIS — L304 Erythema intertrigo: Secondary | ICD-10-CM

## 2018-01-16 DIAGNOSIS — Z5189 Encounter for other specified aftercare: Secondary | ICD-10-CM

## 2018-01-16 MED ORDER — NYSTATIN 100000 UNIT/GM EX CREA: 1.0000 "application " | TOPICAL_CREAM | Freq: Two times a day (BID) | CUTANEOUS | 1 refills | Status: DC

## 2018-01-16 NOTE — Progress Notes (Signed)
Incision check. Kathrene Alu RN

## 2018-01-16 NOTE — Progress Notes (Signed)
   Subjective:    Patient ID: Savannah Henry, female    DOB: [birth year], 37 y.o.   MRN: [id number]  HPI Patient seen for wound check. Had visit from home health nurse, who was concerned about right corner of incision. No drainage. Still has some pain, particularly with sitting up. Also has itching on right thigh and mons.   Review of Systems     Objective:   Physical Exam  Constitutional: She appears well-developed and well-nourished.  Pulmonary/Chest: Effort normal.  Abdominal: Soft. Bowel sounds are normal. She exhibits no distension. There is no tenderness. There is no rebound and no guarding.  Incision clean, dry, intact. No erythema. Can see suture coming out of skin, but no defect.  Skin:  Erythema on right inguinal intertriginous area      Assessment & Plan:  1. Visit for wound check Continue with wound care - soapy wash cloth to wash, pat dry. Keep clean and dry. F/u in 2 weeks - sooner if changes to incision.  2. Intertrigo Nystatin cream

## 2018-02-05 ENCOUNTER — Ambulatory Visit: Payer: Self-pay | Admitting: Obstetrics & Gynecology

## 2018-03-11 ENCOUNTER — Ambulatory Visit: Payer: Self-pay | Admitting: Advanced Practice Midwife

## 2018-03-13 ENCOUNTER — Encounter: Payer: Self-pay | Admitting: Family Medicine

## 2018-03-13 ENCOUNTER — Ambulatory Visit: Payer: Federal, State, Local not specified - PPO | Admitting: Family Medicine

## 2018-03-13 VITALS — BP 135/78 | HR 88 | Wt 188.0 lb

## 2018-03-13 DIAGNOSIS — L304 Erythema intertrigo: Secondary | ICD-10-CM

## 2018-03-13 DIAGNOSIS — M9903 Segmental and somatic dysfunction of lumbar region: Secondary | ICD-10-CM

## 2018-03-13 DIAGNOSIS — M545 Low back pain, unspecified: Secondary | ICD-10-CM

## 2018-03-13 MED ORDER — CLOTRIMAZOLE 1 % EX CREA: 1.0000 "application " | TOPICAL_CREAM | Freq: Two times a day (BID) | CUTANEOUS | 2 refills | Status: AC

## 2018-03-13 NOTE — Progress Notes (Signed)
   Subjective:    Patient ID: Savannah Henry, female    DOB: [birth year], 37 y.o.   MRN: [id number]  HPI Patient seen for intertrigo. Feels like it's getting worse. Nystatin wasn't helpful. Itching spread onto right thigh. Also having inguinal discomfort, radiates into back.   Review of Systems     Objective:   Physical Exam  Constitutional: She is oriented to person, place, and time. She appears well-developed and well-nourished.  Cardiovascular: Normal rate.  Abdominal: Soft.  Musculoskeletal: Normal range of motion.  Discomfort to inguinal area  Neurological: She is alert and oriented to person, place, and time.  No focal deficit  Skin: Skin is warm and dry.  Psychiatric: She has a normal mood and affect. Her behavior is normal. Judgment and thought content normal.  OSE: L3 ESRR, L5 ESRL, L/L torsion, r innom ant     Assessment & Plan:  1. Intertrigo Switch to clotrimazole and hydrocortisone BID x14 days.  2. Acute left-sided low back pain without sciatica 3. Somatic dysfunction of lumbar region OMT done after patient permission. HVLA technique utilized. 3 areas treated with improvement of tissue texture and joint mobility. Patient tolerated procedure well.

## 2018-03-13 NOTE — Progress Notes (Signed)
Patient complaining of on going rash since postpartum. Patient states it is itching and spreading to her thighs. Patient also complaining of back pain. Kathrene Alu RNBSN

## 2018-03-27 ENCOUNTER — Ambulatory Visit: Payer: Self-pay | Admitting: Family Medicine

## 2018-05-05 ENCOUNTER — Ambulatory Visit (HOSPITAL_BASED_OUTPATIENT_CLINIC_OR_DEPARTMENT_OTHER)
Admission: RE | Admit: 2018-05-05 | Discharge: 2018-05-05 | Disposition: A | Discharge status: Home / Self Care | Payer: Federal, State, Local not specified - PPO | Source: Ambulatory Visit | Attending: Obstetrics & Gynecology | Admitting: Obstetrics & Gynecology

## 2018-05-05 ENCOUNTER — Encounter (HOSPITAL_BASED_OUTPATIENT_CLINIC_OR_DEPARTMENT_OTHER): Payer: Self-pay

## 2018-05-05 ENCOUNTER — Encounter: Payer: Self-pay | Admitting: Obstetrics & Gynecology

## 2018-05-05 ENCOUNTER — Ambulatory Visit: Payer: Federal, State, Local not specified - PPO | Admitting: Obstetrics & Gynecology

## 2018-05-05 VITALS — BP 119/85 | HR 86 | Ht 63.0 in | Wt 191.0 lb

## 2018-05-05 DIAGNOSIS — D219 Benign neoplasm of connective and other soft tissue, unspecified: Secondary | ICD-10-CM

## 2018-05-05 DIAGNOSIS — B373 Candidiasis of vulva and vagina: Secondary | ICD-10-CM

## 2018-05-05 DIAGNOSIS — B3731 Acute candidiasis of vulva and vagina: Secondary | ICD-10-CM

## 2018-05-05 DIAGNOSIS — D259 Leiomyoma of uterus, unspecified: Secondary | ICD-10-CM | POA: Insufficient documentation

## 2018-05-05 DIAGNOSIS — R102 Pelvic and perineal pain: Secondary | ICD-10-CM

## 2018-05-05 DIAGNOSIS — L732 Hidradenitis suppurativa: Secondary | ICD-10-CM | POA: Diagnosis not present

## 2018-05-05 DIAGNOSIS — N898 Other specified noninflammatory disorders of vagina: Secondary | ICD-10-CM | POA: Diagnosis not present

## 2018-05-05 MED ORDER — CLOTRIMAZOLE-BETAMETHASONE 1-0.05 % EX CREA: 1.0000 "application " | TOPICAL_CREAM | Freq: Two times a day (BID) | CUTANEOUS | 3 refills | Status: DC

## 2018-05-05 NOTE — Progress Notes (Signed)
History:  37 y.o. [id number] here today for eval of rash on mons pubis. Pt also reports pain in lower pelvis. She  Is s/p manipulation by Dr. Nehemiah Settle with no relif of the pain. She was dx'd with fibroids during her pregnancy. Del 12/31/2017 She has not had a normal cycles since that time.        The following portions of the patient's history were reviewed and updated as appropriate: allergies, current medications, past family history, past medical history, past social history, past surgical history and problem list.  Review of Systems:  Pertinent items are noted in HPI.    Objective:  Physical Exam Blood pressure 119/85, pulse 86, height 5\' 3"  (1.6 m), weight 191 lb (86.6 kg), last menstrual period 04/20/2018, currently breastfeeding.  CONSTITUTIONAL: Well-developed, well-nourished female in no acute distress.  HENT:  Normocephalic, atraumatic EYES: Conjunctivae and EOM are normal. No scleral icterus.  NECK: Normal range of motion SKIN: Skin is warm and dry. No rash noted. Not diaphoretic.No pallor. [place]: Alert and oriented to person, place, and time. Normal coordination.  Abd: Soft, nontender and nondistended Pelvic:external genitalia- there are some areas of inflammation and also scarring on the mons pubis. There are also areas of excoriation on the upper thighs. ; normal appearing vaginal mucosa and cervix.  Normal discharge.  Small uterus, no other palpable masses, no uterine or adnexal tenderness    Assessment & Plan:  Hidradenitis suppurative of the vulva.   Yeast on upper thiyghts  Lotrisone apply to affected areas bif-tid  F/u in 3 months or sooner prn  Pelvic US to eval pelvic pain.   Vahan Wadsworth L. Harraway-Smith, M.D., Cherlynn June

## 2018-05-05 NOTE — Progress Notes (Signed)
Patient has history abnormal pap. Last pap smear Jan 2018 WNL. Kathrene Alu RN

## 2018-06-16 ENCOUNTER — Ambulatory Visit: Payer: Self-pay | Admitting: Obstetrics & Gynecology

## 2018-06-16 DIAGNOSIS — Z09 Encounter for follow-up examination after completed treatment for conditions other than malignant neoplasm: Secondary | ICD-10-CM

## 2018-06-18 ENCOUNTER — Ambulatory Visit: Payer: Self-pay | Admitting: Obstetrics & Gynecology

## 2018-08-27 ENCOUNTER — Ambulatory Visit: Payer: Federal, State, Local not specified - PPO | Admitting: Family

## 2018-08-27 ENCOUNTER — Encounter: Payer: Self-pay | Admitting: Family

## 2018-08-27 VITALS — BP 117/89 | HR 79 | Temp 98.6°F | Resp 18 | Ht 63.0 in | Wt 196.4 lb

## 2018-08-27 DIAGNOSIS — J028 Acute pharyngitis due to other specified organisms: Secondary | ICD-10-CM

## 2018-08-27 DIAGNOSIS — J029 Acute pharyngitis, unspecified: Secondary | ICD-10-CM | POA: Diagnosis not present

## 2018-08-27 DIAGNOSIS — B9789 Other viral agents as the cause of diseases classified elsewhere: Secondary | ICD-10-CM

## 2018-08-27 LAB — POCT RAPID STREP A (OFFICE): RAPID STREP A SCREEN: NEGATIVE

## 2018-08-27 NOTE — Patient Instructions (Signed)
You may use over-the-counter ibuprofen 600 mg every 6 hours as needed for sore throat pain for the next few days.  You may also use Chloraseptic Spray as needed for pain.  Please call if new or worsening symptoms, if you develop fever over 101 or if you are not improved in 3 days.

## 2018-08-27 NOTE — Progress Notes (Signed)
Subjective:    Patient ID: Savannah Henry, female    DOB: 1981/08/03, 37 y.o.   MRN: [id number]  HPI   Patient is a 37 year old female who presents today with chief complaint of sore throat.  Sore throat is been present for 3 days.  She reports sick contacts including her child and a friend.  Reports sore throat pain is 9/10. Tried otc allergy pills. Did not help. Tried tylenol and otc cold preps without much improvement though it helps her to sleep. Has missed work the last 2 days. Has mild associated cough. Reports mild nasal congestion.  No known fever.   Review of Systems See HPI  Past Medical History:  Diagnosis Date  . Anxiety   . Asthma    no meds needed, maybe 1-2 times per year  . Chicken pox   . Dysrhythmia    palpitations with pregnancy  . GERD (gastroesophageal reflux disease)    no meds  . Hyperlipidemia   . Obesity   . PONV (postoperative nausea and vomiting)   . Vaginal Pap smear, abnormal 2000     Social History   Socioeconomic History  . Marital status: Married    Spouse name: Not on file  . Number of children: 2  . Years of education: Not on file  . Highest education level: Not on file  Occupational History  . Occupation: Freight forwarder  Social Needs  . Financial resource strain: Not on file  . Food insecurity:    Worry: Not on file    Inability: Not on file  . Transportation needs:    Medical: Not on file    Non-medical: Not on file  Tobacco Use  . Smoking status: Never Smoker  . Smokeless tobacco: Never Used  Substance and Sexual Activity  . Alcohol use: No  . Drug use: No  . Sexual activity: Yes    Partners: Male    Birth control/protection: None  Lifestyle  . Physical activity:    Days per week: Not on file    Minutes per session: Not on file  . Stress: Not on file  Relationships  . Social connections:    Talks on phone: Not on file    Gets together: Not on file    Attends religious service: Not on file    Active member of club or  organization: Not on file    Attends meetings of clubs or organizations: Not on file    Relationship status: Not on file  . Intimate partner violence:    Fear of current or ex partner: Not on file    Emotionally abused: Not on file    Physically abused: Not on file    Forced sexual activity: Not on file  Other Topics Concern  . Not on file  Social History Narrative   Works for [employer]   Daughter- 2005   Daughter-2014   Engaged   Enjoys spending itme with her family, friends    Past Surgical History:  Procedure Laterality Date  . CESAREAN SECTION  2005  . CESAREAN SECTION N/A 08/06/2013   Procedure: REPEAT CESAREAN SECTION;  Surgeon: Shelly Bombard, MD;  Location: [facility];  Service: Obstetrics;  Laterality: N/A;  . CESAREAN SECTION WITH BILATERAL TUBAL LIGATION N/A 12/31/2017   Procedure: CESAREAN SECTION WITH BILATERAL TUBAL LIGATION;  Surgeon: Truett Mainland, DO;  Location: [facility];  Service: Obstetrics;  Laterality: N/A;  . CHOLECYSTECTOMY  2007  . DILATION AND CURETTAGE OF UTERUS  12/18/2015   pt reported miscarriage  . EXCISION METACARPAL MASS Right 09/21/2013   Procedure: RIGHT PALM EXCISION MASS;  Surgeon: Tennis Must, MD;  Location: [facility];  Service: Orthopedics;  Laterality: Right;    Family History  Problem Relation Age of Onset  . Arthritis Mother   . Breast cancer Mother 59  . ALS Father   . Breast cancer Maternal Aunt   . Lung cancer Maternal Uncle   . Diabetes Paternal Aunt   . Arthritis Maternal Grandmother   . Breast cancer Maternal Grandmother   . Heart disease Paternal Grandmother   . Hypertension Paternal Grandmother   . Diabetes Paternal Grandfather     Allergies  Allergen Reactions  . Other Nausea And Vomiting and Other (See Comments)    Anesthesia     No current outpatient medications on file prior to visit.   No current facility-administered medications on file prior to visit.     BP 117/89 (BP  Location: Right Arm, Cuff Size: Large)   Pulse 79   Temp 98.6 F (37 C) (Oral)   Resp 18   Ht 5\' 3"  (1.6 m)   Wt 196 lb 6.4 oz (89.1 kg)   LMP 08/14/2018   SpO2 100%   BMI 34.79 kg/m       Objective:   Physical Exam  Constitutional: She appears well-developed and well-nourished.  HENT:  Right Ear: Tympanic membrane and ear canal normal.  Mouth/Throat: Oropharynx is clear and moist.  L TM occluded by cerumen.  2+ tonsils bilaterally Mild oropharyngeal erythema Fine blisters noted on tonsils/posterior oropharynx  Cardiovascular: Normal rate, regular rhythm and normal heart sounds.  No murmur heard. Pulmonary/Chest: Effort normal and breath sounds normal. No respiratory distress. She has no wheezes.  Psychiatric: She has a normal mood and affect. Her behavior is normal. Judgment and thought content normal.          Assessment & Plan:  Viral pharyngitis- rapid strep negative.  Pt is advised as follows:    You may use over-the-counter ibuprofen 600 mg every 6 hours as needed for sore throat pain for the next few days.  You may also use Chloraseptic Spray as needed for pain.  Please call if new or worsening symptoms, if you develop fever over 101 or if you are not improved in 3 days.

## 2018-12-05 DIAGNOSIS — J40 Bronchitis, not specified as acute or chronic: Secondary | ICD-10-CM | POA: Diagnosis not present

## 2018-12-05 DIAGNOSIS — J028 Acute pharyngitis due to other specified organisms: Secondary | ICD-10-CM | POA: Diagnosis not present

## 2019-01-04 DIAGNOSIS — J019 Acute sinusitis, unspecified: Secondary | ICD-10-CM | POA: Diagnosis not present

## 2019-03-10 ENCOUNTER — Telehealth: Payer: Self-pay

## 2019-03-10 ENCOUNTER — Encounter: Payer: Self-pay | Admitting: Family

## 2019-03-10 NOTE — Telephone Encounter (Signed)
Patient notified that letter has been posted to her mychart account.

## 2019-03-10 NOTE — Telephone Encounter (Signed)
Copied from Laurie [id number]. Topic: General - Other >> Mar 10, 2019  3:06 PM Rayann Heman wrote: Reason for CRM: pt called and stated that she would like a note that states that she has asthma. Pt states that she needs this note so that she can work from home. Please advise

## 2019-03-25 ENCOUNTER — Ambulatory Visit: Payer: Self-pay

## 2019-03-25 ENCOUNTER — Ambulatory Visit (INDEPENDENT_AMBULATORY_CARE_PROVIDER_SITE_OTHER): Payer: Federal, State, Local not specified - PPO | Admitting: Family

## 2019-03-25 ENCOUNTER — Other Ambulatory Visit: Payer: Self-pay

## 2019-03-25 DIAGNOSIS — F419 Anxiety disorder, unspecified: Secondary | ICD-10-CM

## 2019-03-25 MED ORDER — ALBUTEROL SULFATE HFA 108 (90 BASE) MCG/ACT IN AERS: 2.0000 | INHALATION_SPRAY | Freq: Four times a day (QID) | RESPIRATORY_TRACT | 1 refills | Status: DC | PRN

## 2019-03-25 NOTE — Progress Notes (Signed)
Virtual Visit via Video Note  I connected with@ on 03/25/19 at  2:20 PM EDT by a video enabled telemedicine application and verified that I am speaking with the correct person using two identifiers. This visit type was conducted due to national recommendations for restrictions regarding the COVID-19 Pandemic (e.g. social distancing).  This format is felt to be most appropriate for this patient at this time.   I discussed the limitations of evaluation and management by telemedicine and the availability of in person appointments. The patient expressed understanding and agreed to proceed.  Only the patient and myself were on today's video visit. The patient was at work and I was in my office at the time of today's visit.   History of Present Illness:  Reports that when she gets ready to leave the house or when she is at work by herself she starts to feel like "I need some air." Feels anxious at times. Worried about her elderly family members who live in [place]. Reports that she went to an urgent care in December and was given albuterol which seemed to help but she lost the mdi.  She denies fever, cough, chest pain.     Observations/Objective:  Gen: Awake, alert, no acute distress Resp: Breathing is even and non-labored Psych: mildly anxious, pleasant demeanor Neuro: Alert and Oriented x 3, + facial symmetry, speech is clear.   Assessment and Plan:   Anxiety- suspect that this is the primary factor in her symptoms of shortness of breath due to the pattern of occurrence. I gave her the number for [facility] so she can call and schedule an appointment with a counselor.  Since she has had improvement in her symptoms with albuterol (? Placebo) I have agreed to refill her albuterol.   She is advised to let me know if symptoms worsen or if they fail to improve. Would consider starting SSRI at that time. We did discuss pros/cons and side effects. She is nursing her 74 month old son once  a day currently.   15 minutes spent with pt today. >50% of this time was spent counseling pt on anxiety.  Follow Up Instructions:    I discussed the assessment and treatment plan with the patient. The patient was provided an opportunity to ask questions and all were answered. The patient agreed with the plan and demonstrated an understanding of the instructions.   The patient was advised to call back or seek an in-person evaluation if the symptoms worsen or if the condition fails to improve as anticipated.    Nance Pear, NP

## 2019-03-25 NOTE — Telephone Encounter (Signed)
Pt. Left vm. On Rx Refill Line; requested a refill on Albuterol Inhaler, that she rec'd back in December, when eval. at [facility] for URI.   Phone call returned to pt.  Reported she has had intermittent shortness of breath for past 3-4 weeks.  Stated she just feels that her breathing rate increases when she goes outside her home, or even at work.  Denied cough or fever.  Reported she feels light-headed at times with the SOB.  Reported hx of Asthma; denied any wheezing with SOB.  Stated "I don't know if it's my anxiety about the COVID 19, or not."  Reported it has taken about 30 min., to slow her breathing down, and feel like it is better controlled.  Denied any chest tightness, or pain with the SOB.  Denied travel or poss. Exposure to known Coronavirus pt., within past 2 weeks.  Reported she works alone right now, at [employer], and feels, at times, that she has to do a lot that she doesn't normally do, and feels more stress.  Called [facility] to discuss need for appt.  Transferred pt. To [facility] for scheduling of the appt.       Reason for Disposition . [1] MILD difficulty breathing (e.g., minimal/no SOB at rest, SOB with walking, pulse <100) AND [2] NEW-onset or WORSE than normal  Answer Assessment - Initial Assessment Questions 1. RESPIRATORY STATUS: "Describe your breathing?" (e.g., wheezing, shortness of breath, unable to speak, severe coughing)      Feels her breathing rate increases when she is outside her home or at work  2. ONSET: "When did this breathing problem begin?"      Over past 3-4 weeks 3. PATTERN "Does the difficult breathing come and go, or has it been constant since it started?"     Comes and goes  4. SEVERITY: "How bad is your breathing?" (e.g., mild, moderate, severe)    - MILD: No SOB at rest, mild SOB with walking, speaks normally in sentences, can lay down, no retractions, pulse < 100.    - MODERATE: SOB at rest, SOB with minimal exertion and prefers to sit, cannot lie down  flat, speaks in phrases, mild retractions, audible wheezing, pulse 100-120.    - SEVERE: Very SOB at rest, speaks in single words, struggling to breathe, sitting hunched forward, retractions, pulse > 120     Mild to moderate  5. RECURRENT SYMPTOM: "Have you had difficulty breathing before?" If so, ask: "When was the last time?" and "What happened that time?"     Had resp. Infection in December; stated current symptom of SOB are not comparable.  6. CARDIAC HISTORY: "Do you have any history of heart disease?" (e.g., heart attack, angina, bypass surgery, angioplasty)      *No Answer* 7. LUNG HISTORY: "Do you have any history of lung disease?"  (e.g., pulmonary embolus, asthma, emphysema)     Hx of asthma 8. CAUSE: "What do you think is causing the breathing problem?"      *No Answer* 9. OTHER SYMPTOMS: "Do you have any other symptoms? (e.g., dizziness, runny nose, cough, chest pain, fever)     Some light-headed feeling with SOB; Denied fever or cough ; denied nasal stuffiness, sore throat, sinus pressure, or any other ill feeling  10. PREGNANCY: "Is there any chance you are pregnant?" "When was your last menstrual period?"       LMP about 1 mo. Ago  11. TRAVEL: "Have you traveled out of the country in  the last month?" (e.g., travel history, exposures)       Denied  Protocols used: BREATHING DIFFICULTY-A-AH

## 2019-06-01 ENCOUNTER — Other Ambulatory Visit: Payer: Self-pay

## 2019-06-01 MED ORDER — ALBUTEROL SULFATE HFA 108 (90 BASE) MCG/ACT IN AERS: 2.0000 | INHALATION_SPRAY | Freq: Four times a day (QID) | RESPIRATORY_TRACT | 1 refills | Status: DC | PRN

## 2019-07-08 ENCOUNTER — Other Ambulatory Visit: Payer: Self-pay

## 2019-07-08 MED ORDER — ALBUTEROL SULFATE HFA 108 (90 BASE) MCG/ACT IN AERS: 2.0000 | INHALATION_SPRAY | Freq: Four times a day (QID) | RESPIRATORY_TRACT | 1 refills | Status: DC | PRN

## 2019-08-04 IMAGING — US US MFM OB DETAIL+14 WK
1 series · 13 of 28 positions shown · non-contrast
Comparison: none

[Series 1: us mfm ob detail+14 wk · 77 acquisitions, 13 frames shown]
[im 3/77]
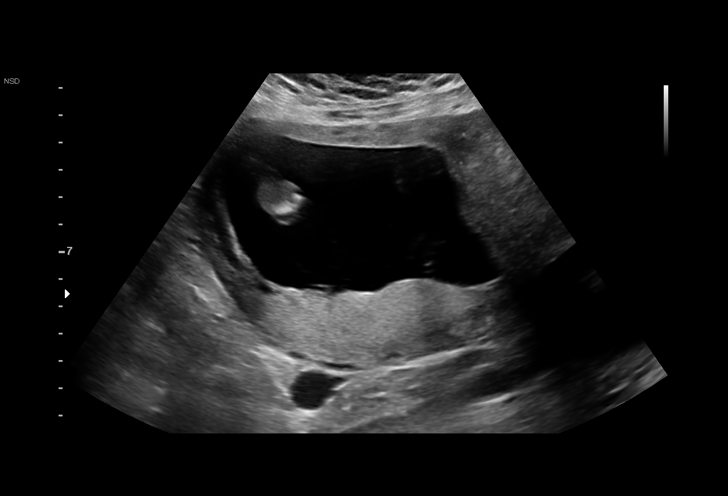
[im 9/77]
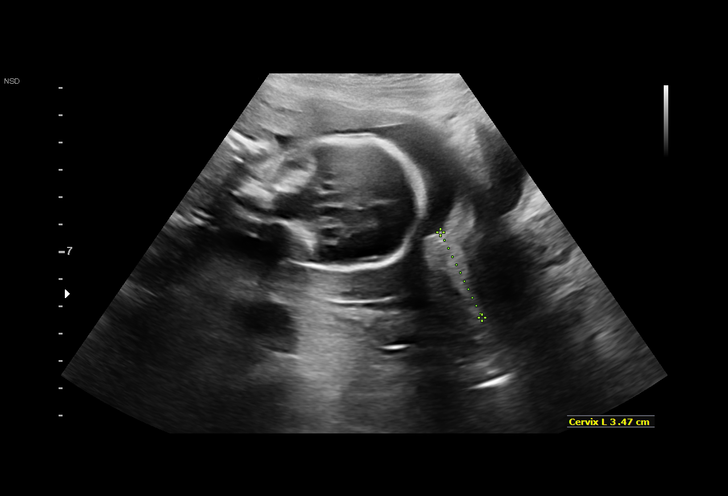
[im 15/77]
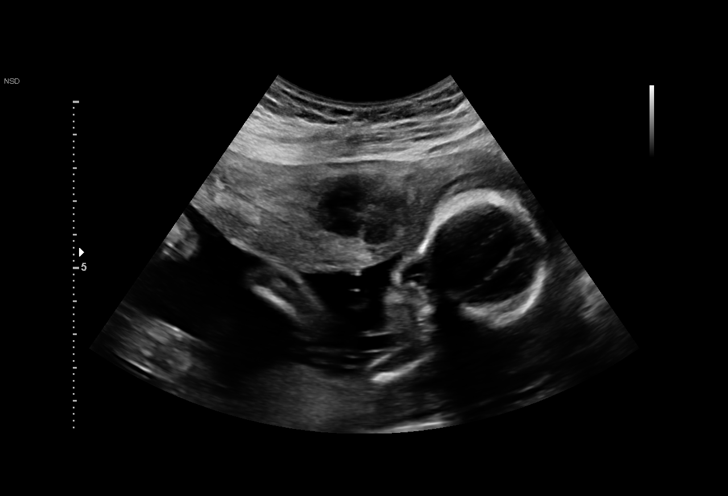
[im 20/77]
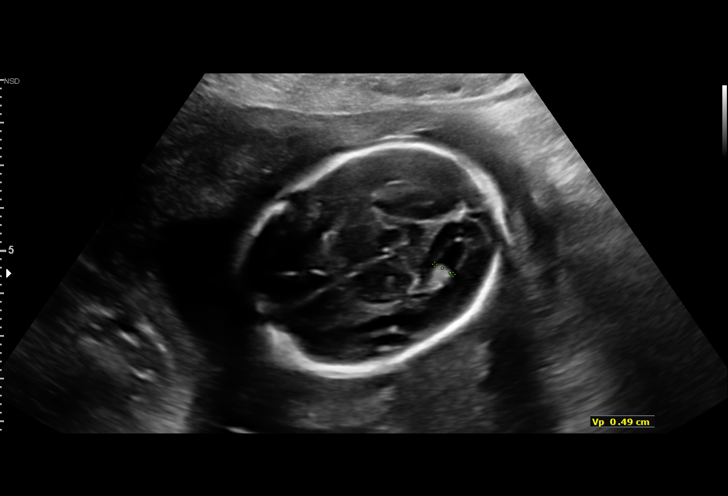
[im 26/77]
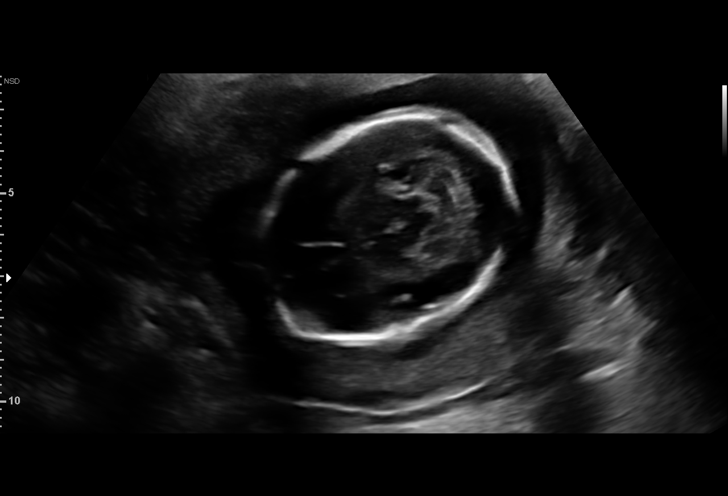
[im 31/77]
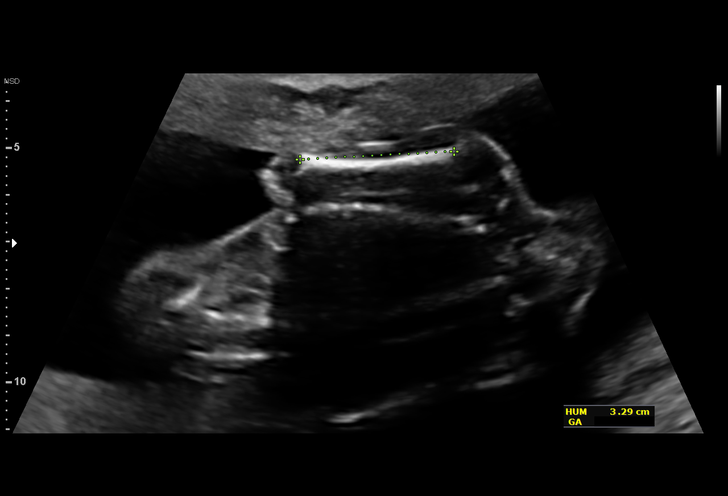
[im 40/77]
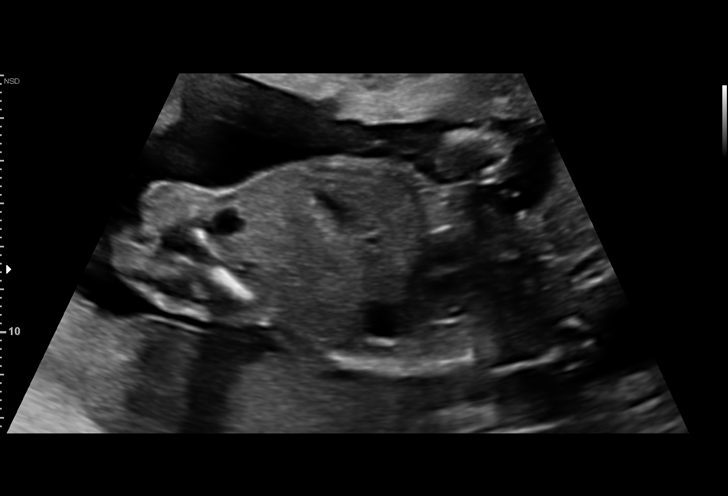
[im 46/77]
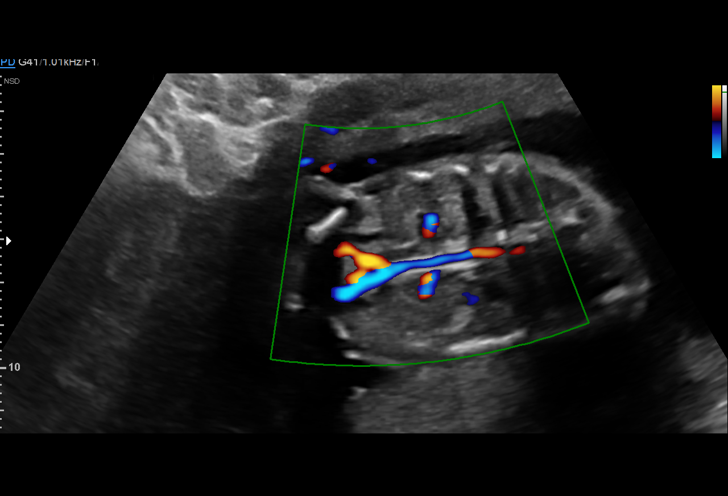
[im 51/77]
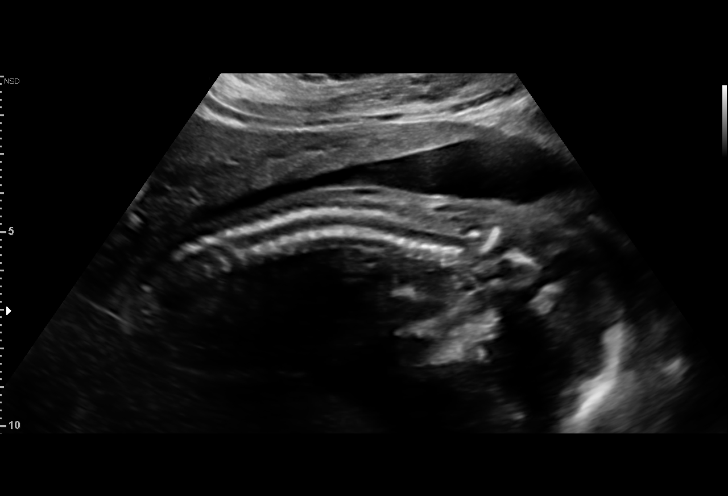
[im 57/77]
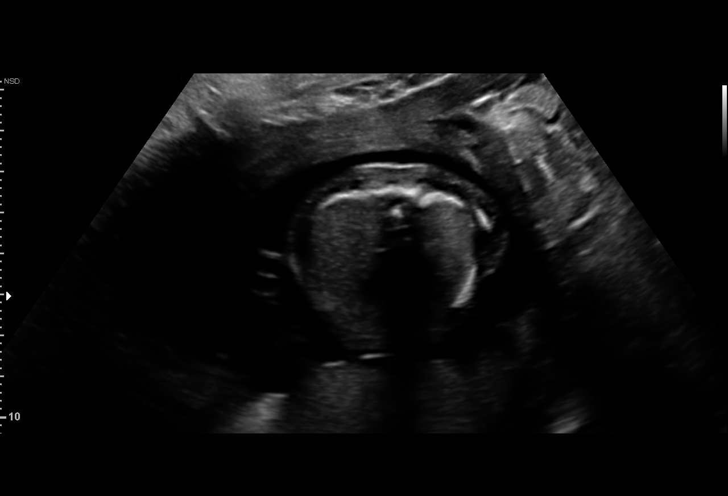
[im 62/77]
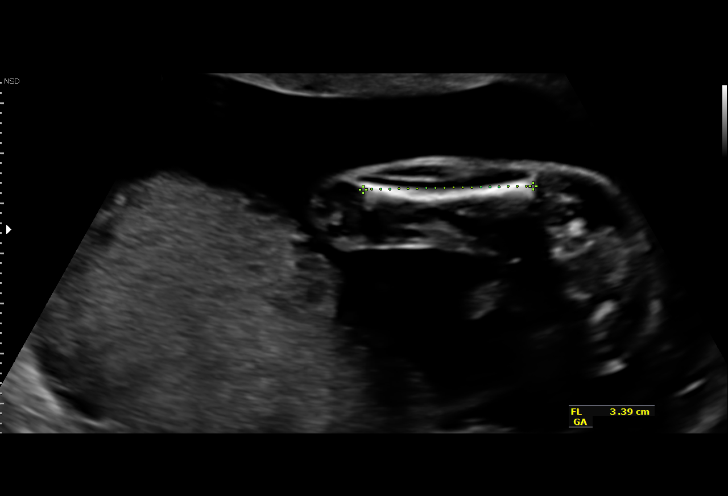
[im 68/77]
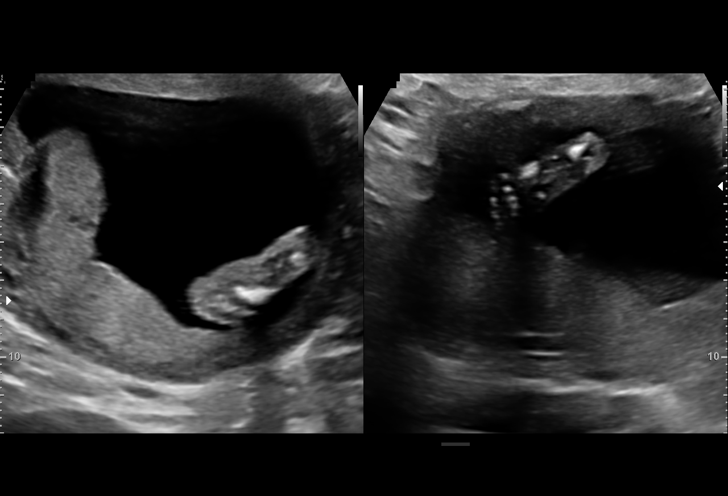
[im 74/77]
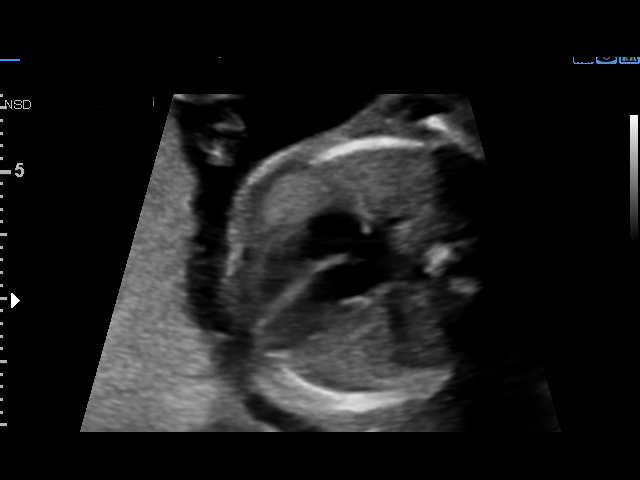

[13 of 28 positions shown; findings below may reference images not displayed]

for [REDACTED]care
CEEJAY

HALANOCA
Indications

20 weeks gestation of pregnancy
Advanced maternal age multigravida 35+,
second trimester
Obesity complicating pregnancy, second
trimester
History of cesarean delivery x 2, currently
pregnant
Uterine fibroids affecting pregnancy in        O34.12,
second trimester, antepartum
OB History

Blood Type:            Height:  5'3"   Weight (lb):  205      BMI:
Gravidity:    5         Term:   2        Prem:   0        SAB:   2
TOP:          0       Ectopic:  0        Living: 2
Fetal Evaluation

Num Of Fetuses:     1
Fetal Heart         141
Rate(bpm):
Cardiac Activity:   Observed
Presentation:       Cephalic
Placenta:           Posterior, above cervical os
P. Cord Insertion:  Visualized, central
Amniotic Fluid
AFI FV:      Subjectively within normal limits

Largest Pocket(cm)
5.9
Biometry

BPD:      47.7  mm     G. Age:  20w 3d         62  %    CI:        70.18   %   70 - 86
FL/HC:      18.7   %   16.8 -
HC:      181.6  mm     G. Age:  20w 4d         61  %    HC/AC:      1.11       1.09 -
AC:      163.7  mm     G. Age:  21w 3d         82  %    FL/BPD:     71.1   %
FL:       33.9  mm     G. Age:  20w 5d         60  %    FL/AC:      20.7   %   20 - 24
HUM:      32.7  mm     G. Age:  21w 0d         76  %
CER:      22.8  mm     G. Age:  21w 3d         75  %

CM:        6.4  mm
Est. FW:     392  gm    0 lb 14 oz      60  %
Gestational Age

LMP:           27w 5d       Date:   02/08/17                 EDD:   11/15/17
U/S Today:     20w 6d                                        EDD:   01/02/18
Best:          20w 1d    Det. By:   Early Ultrasound         EDD:   01/07/18
(06/05/17)
Anatomy

Cranium:               Appears normal         Aortic Arch:            Appears normal
Cavum:                 Appears normal         Ductal Arch:            Appears normal
Ventricles:            Appears normal         Diaphragm:              Appears normal
Choroid Plexus:        Appears normal         Stomach:                Appears normal, left
sided
Cerebellum:            Appears normal         Abdomen:                Appears normal
Posterior Fossa:       Appears normal         Abdominal Wall:         Appears nml (cord
insert, abd wall)
Nuchal Fold:           Not applicable (>20    Cord Vessels:           Appears normal (3
wks GA)                                        vessel cord)
Face:                  Orbits appear          Kidneys:                Appear normal
normal
Lips:                  Appears normal         Bladder:                Appears normal
Heart:                 Appears normal         Spine:                  Appears normal
(4CH, axis, and situs
RVOT:                  Not well visualized    Upper Extremities:      Visualized
LVOT:                  Not well visualized    Lower Extremities:      Appears normal

Other:  Fetus appears to be a male. Heels visualized. Technically difficult due
to fetal position.
Cervix Uterus Adnexa

Cervix
Length:            3.5  cm.
Normal appearance by transabdominal scan.

Left Ovary
Within normal limits.

Right Ovary
Within normal limits.
Adnexa:       No abnormality visualized.
Myomas

Site                     L(cm)      W(cm)      D(cm)      Location
Anterior

Blood Flow                 RI        PI       Comments

Impression

Singleton intrauterine pregnancy at 20+1 weeks
Review of the anatomy shows no sonographic markers for
aneuploidy or structural anomalies
However, cardiac evaluation should be considered
suboptimal secondary to fetal position
Amniotic fluid volume is normal
Estimated fetal weight is 392g which is growth in the 60th
percentile
There is a small anterior fibroid noted, as above
Recommendations

Consider repeat scan in 6 weeks to assess for change in
fibroid

## 2019-11-07 DIAGNOSIS — Z20828 Contact with and (suspected) exposure to other viral communicable diseases: Secondary | ICD-10-CM | POA: Diagnosis not present

## 2019-11-09 IMAGING — US US MFM OB FOLLOW-UP
1 series · 14 of 28 positions shown · non-contrast
Comparison: none

[Series 1: us mfm ob follow-up · 53 acquisitions, 14 frames shown]
[im 2/53]
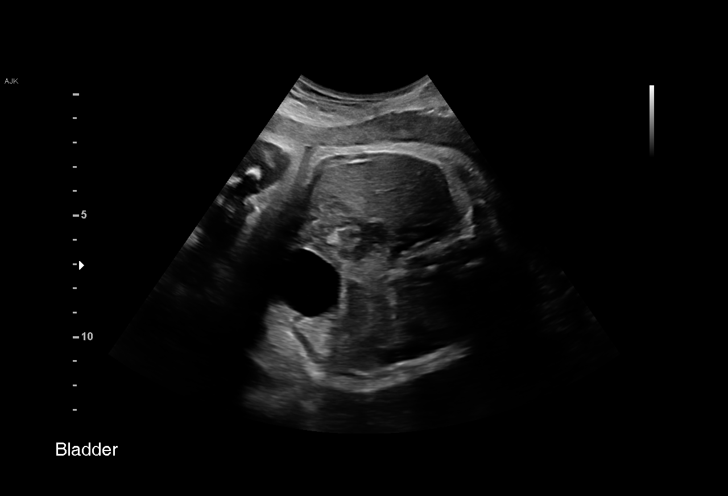
[im 6/53]
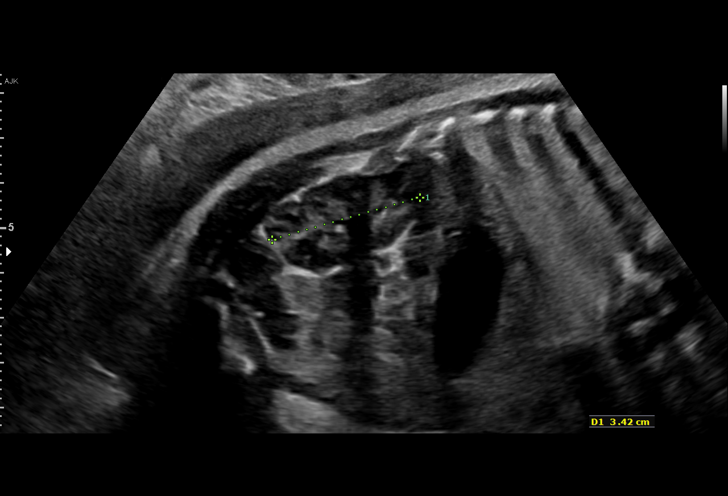
[im 10/53]
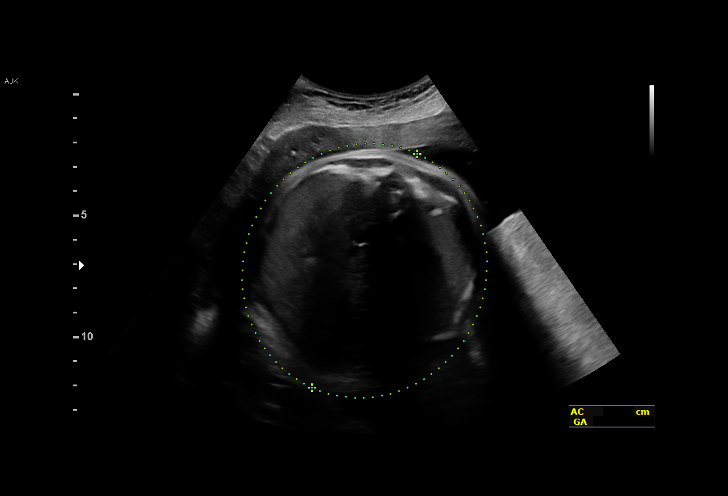
[im 14/53]
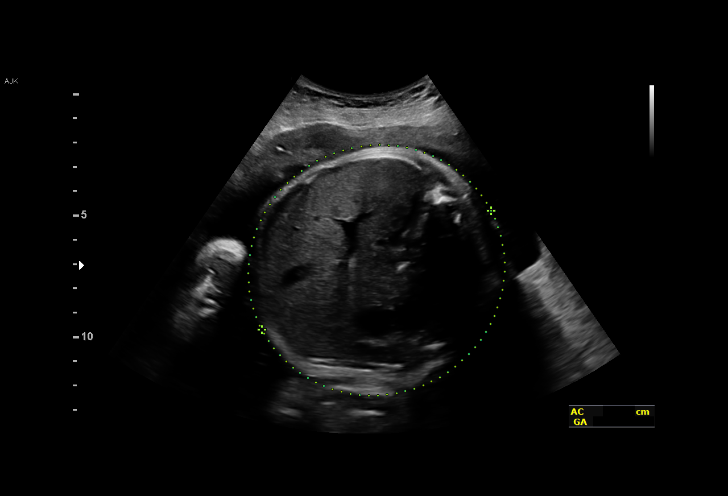
[im 18/53]
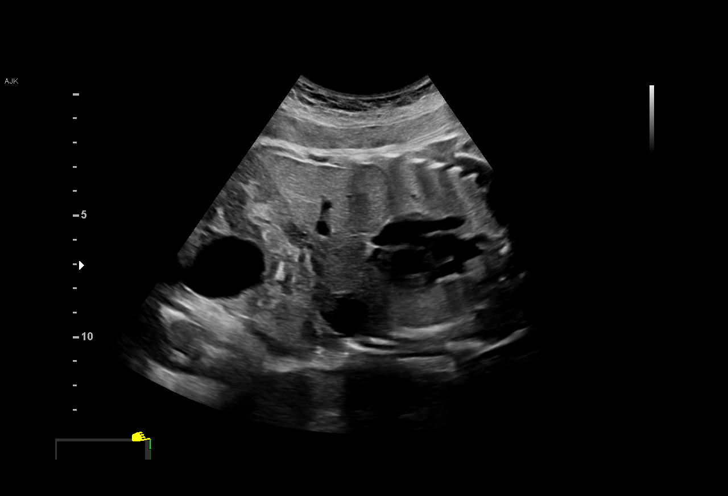
[im 22/53]
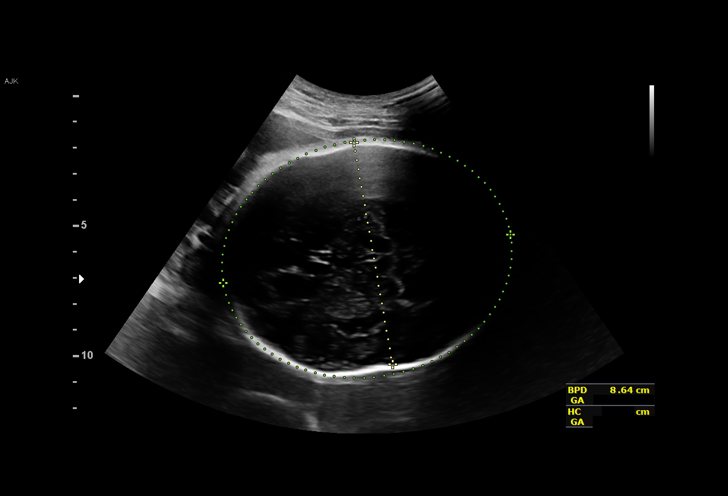
[im 26/53]
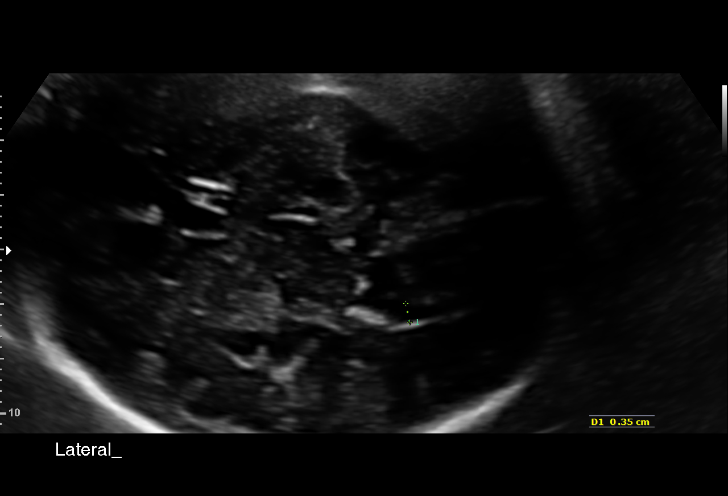
[im 29/53]
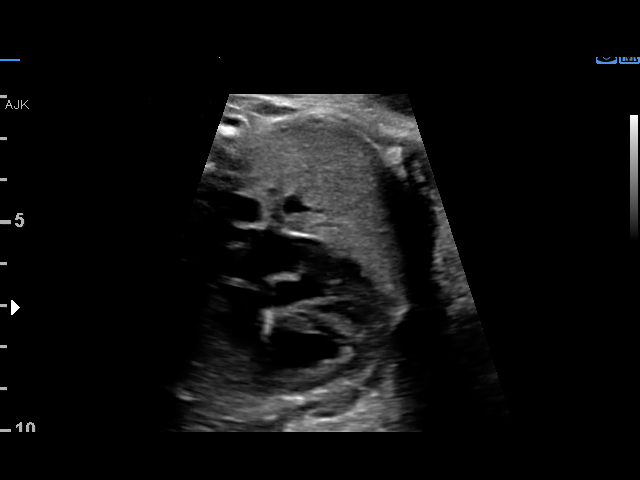
[im 33/53]
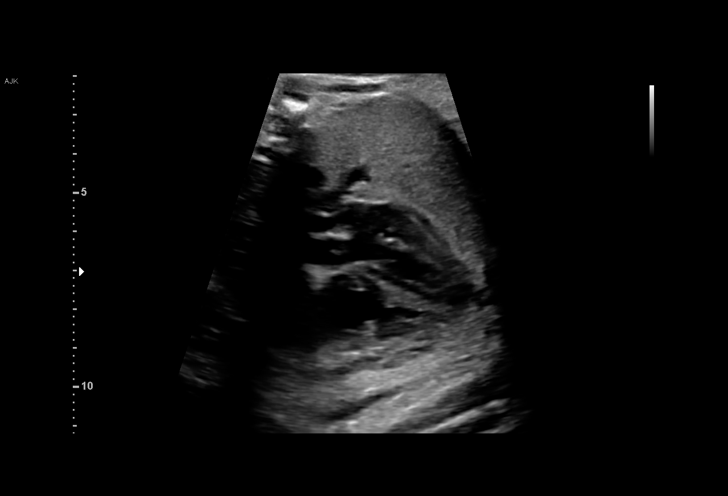
[im 37/53]
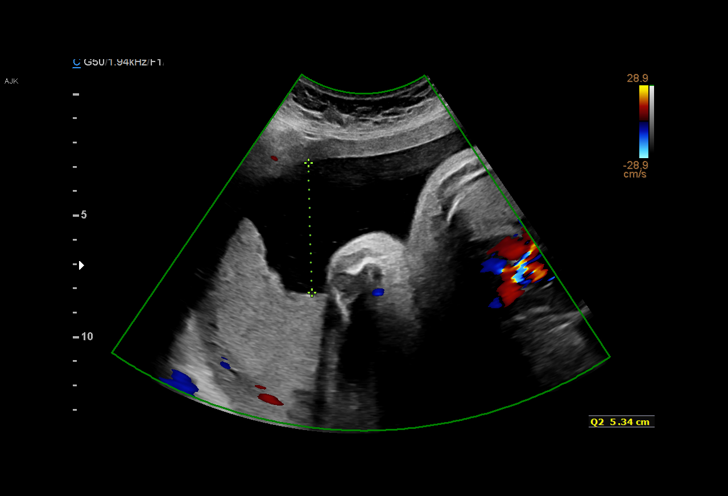
[im 41/53]
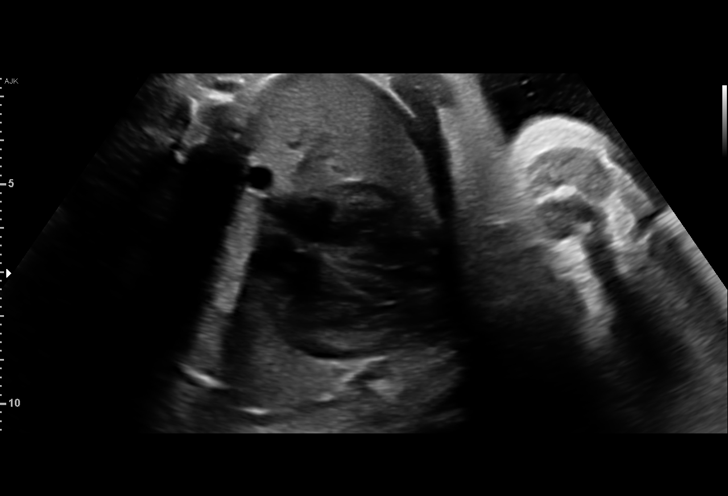
[im 45/53]
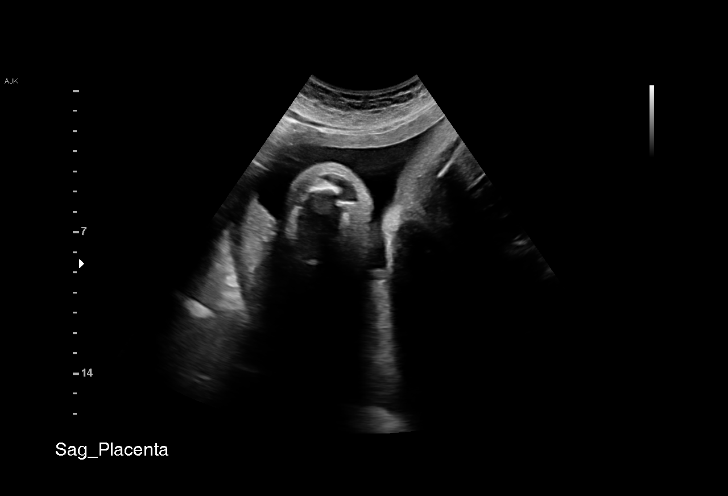
[im 49/53]
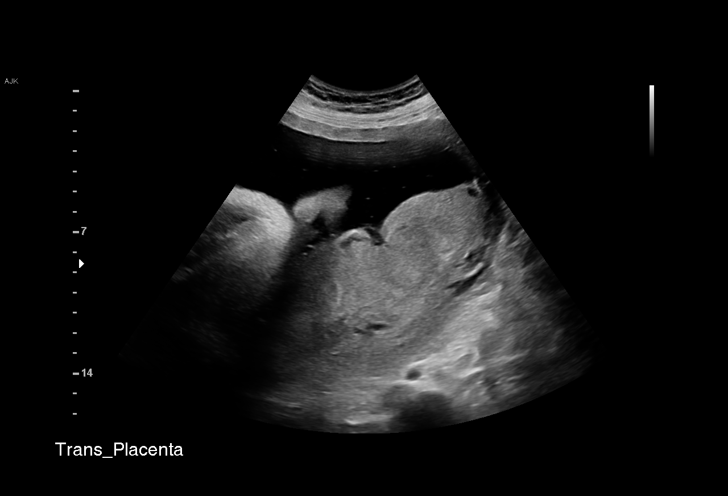
[im 53/53]
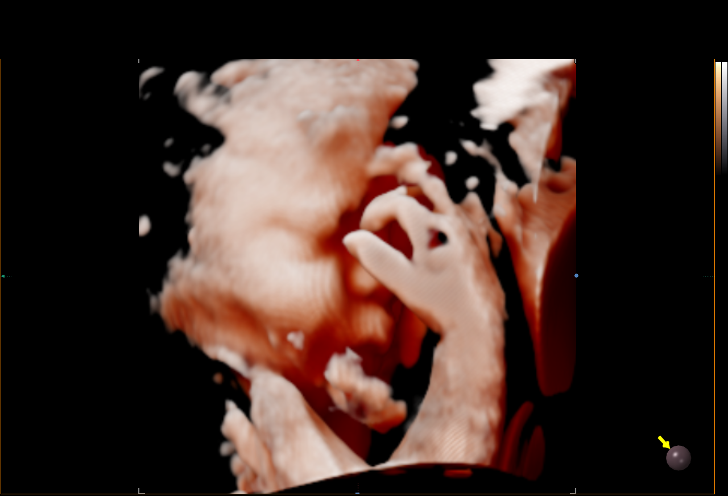

[14 of 28 positions shown; findings below may reference images not displayed]

for [REDACTED]care
JUMPER

1  YORDANI GAL              [id number]      [id number]     [id number]
Indications

34 weeks gestation of pregnancy
History of cesarean delivery x 2, currently
pregnant
Evaluate anatomy not seen on prior
sonogram
Advanced maternal age multigravida 35+,
third trimester
Obesity complicating pregnancy, third
trimester
Uterine fibroids affecting pregnancy in third  O34.13,
trimester, antepartum
OB History

Blood Type:            Height:  5'3"   Weight (lb):  205       BMI:
Gravidity:    5         Term:   2        Prem:   0        SAB:   2
TOP:          0       Ectopic:  0        Living: 2
Fetal Evaluation

Num Of Fetuses:     1
Fetal Heart         148
Rate(bpm):
Cardiac Activity:   Observed
Presentation:       Cephalic
Placenta:           Posterior, above cervical os
P. Cord Insertion:  Previously Visualized
Amniotic Fluid
AFI FV:      Subjectively within normal limits

AFI Sum(cm)     %Tile       Largest Pocket(cm)
15.52           56

RUQ(cm)       RLQ(cm)       LUQ(cm)        LLQ(cm)
3.11
Biometry

BPD:      86.2  mm     G. Age:  34w 5d         70  %    CI:        73.21   %    70 - 86
FL/HC:      20.3   %    19.4 -
HC:      320.2  mm     G. Age:  36w 1d         68  %    HC/AC:      0.98        0.96 -
AC:      327.6  mm     G. Age:  36w 5d       > 97  %    FL/BPD:     75.3   %    71 - 87
FL:       64.9  mm     G. Age:  33w 3d         26  %    FL/AC:      19.8   %    20 - 24

Est. FW:    4432  gm           6 lb     83  %
Gestational Age

LMP:           41w 4d        Date:  02/08/17                 EDD:   11/15/17
U/S Today:     35w 2d                                        EDD:   12/29/17
Best:          34w 0d     Det. By:  Early Ultrasound         EDD:   01/07/18
(06/05/17)
Anatomy

Cranium:               Appears normal         Aortic Arch:            Previously seen
Cavum:                 Appears normal         Ductal Arch:            Previously seen
Ventricles:            Appears normal         Diaphragm:              Appears normal
Choroid Plexus:        Previously seen        Stomach:                Appears normal, left
sided
Cerebellum:            Previously seen        Abdomen:                Appears normal
Posterior Fossa:       Previously seen        Abdominal Wall:         Previously seen
Nuchal Fold:           Not applicable (>20    Cord Vessels:           Previously seen
wks GA)
Face:                  Orbits previously      Kidneys:                Appear normal
seen
Lips:                  Previously seen        Bladder:                Appears normal
Thoracic:              Appears normal         Spine:                  Previously seen
Heart:                 Appears normal         Upper Extremities:      Previously seen
(4CH, axis, and situs
RVOT:                  Appears normal         Lower Extremities:      Previously seen
LVOT:                  Appears normal

Other:  Male gender previously seen.Heels previously visualized. Technically
difficult due to fetal position and maternal body habitus.
Impression

Single living intrauterine pregnancy at 34+0 weeks
Appropriate fetal growth in the 83rd percentile; AC is >97th
percentile
Normal amniotic fluid volume.
Interval review of fetal anatomy is normal
Recommendations

Would recommend a repeat scan in 4 weeks to reassess
growth

## 2019-11-13 DIAGNOSIS — Z20828 Contact with and (suspected) exposure to other viral communicable diseases: Secondary | ICD-10-CM | POA: Diagnosis not present

## 2021-02-18 ENCOUNTER — Telehealth (INDEPENDENT_AMBULATORY_CARE_PROVIDER_SITE_OTHER): Payer: Federal, State, Local not specified - PPO | Admitting: Family Medicine

## 2021-02-18 VITALS — Temp 97.7°F | Ht 63.0 in | Wt 205.0 lb

## 2021-02-18 DIAGNOSIS — J4521 Mild intermittent asthma with (acute) exacerbation: Secondary | ICD-10-CM

## 2021-02-18 DIAGNOSIS — J329 Chronic sinusitis, unspecified: Secondary | ICD-10-CM | POA: Diagnosis not present

## 2021-02-18 MED ORDER — AZELASTINE HCL 0.1 % NA SOLN: 2.0000 | Freq: Two times a day (BID) | NASAL | 12 refills | Status: DC

## 2021-02-18 MED ORDER — PREDNISONE 50 MG PO TABS: ORAL_TABLET | ORAL | 0 refills | Status: DC

## 2021-02-18 MED ORDER — AZITHROMYCIN 250 MG PO TABS: ORAL_TABLET | ORAL | 0 refills | Status: DC

## 2021-02-18 NOTE — Progress Notes (Signed)
   Savannah Henry is a 40 y.o. female who presents today for a virtual office visit.  Assessment/Plan:  New/Acute Problems: Sinusitis Given length of symptoms will start azithromycin.  She also has asthma flare as noted below we will start prednisone.  Start Astelin nasal spray.  Encourage good oral hydration.  Discussed reasons to return to care.  Chronic Problems Addressed Today: Asthma Mild flare likely secondary to respiratory infection we are treating as above.  She will continue using albuterol and will send in 5-day supply of prednisone.  She will let us know if not improving.    Subjective:  HPI:  Patient symptoms started over a week ago.  Symptoms include sinus congestion, cough, wheeze, and a little bit more shortness of breath.  She feels like her asthma has flared up.  She has been using albuterol inhaler.  Her children have been sick with similar symptoms.  She also has a sore throat.  More malaise.  She has tried taking Tylenol with modest improvement.  She had COVID test which was negative.  Children were tested for strep which was also negative.      Objective/Observations  Physical Exam: Gen: NAD, resting comfortably Pulm: Normal work of breathing Neuro: Grossly normal, moves all extremities Psych: Normal affect and thought content  Virtual Visit via Video   I connected with Savannah Henry on 02/18/21 at  9:20 AM EST by a video enabled telemedicine application and verified that I am speaking with the correct person using two identifiers. The limitations of evaluation and management by telemedicine and the availability of in person appointments were discussed. The patient expressed understanding and agreed to proceed.   Patient location: Home Provider location: [place] participating in the virtual visit: Myself and Patient     [name]. [name], MD 02/18/2021 9:26 AM

## 2021-05-05 ENCOUNTER — Other Ambulatory Visit (HOSPITAL_COMMUNITY)
Admission: RE | Admit: 2021-05-05 | Discharge: 2021-05-05 | Disposition: A | Discharge status: Home / Self Care | Payer: Federal, State, Local not specified - PPO | Source: Ambulatory Visit | Attending: Family | Admitting: Family

## 2021-05-05 ENCOUNTER — Other Ambulatory Visit: Payer: Self-pay

## 2021-05-05 ENCOUNTER — Ambulatory Visit (INDEPENDENT_AMBULATORY_CARE_PROVIDER_SITE_OTHER): Payer: Federal, State, Local not specified - PPO | Admitting: Family

## 2021-05-05 VITALS — BP 137/87 | HR 99 | Temp 99.4°F | Resp 16 | Ht 63.5 in | Wt 216.0 lb

## 2021-05-05 DIAGNOSIS — J4540 Moderate persistent asthma, uncomplicated: Secondary | ICD-10-CM | POA: Insufficient documentation

## 2021-05-05 DIAGNOSIS — R739 Hyperglycemia, unspecified: Secondary | ICD-10-CM

## 2021-05-05 DIAGNOSIS — L309 Dermatitis, unspecified: Secondary | ICD-10-CM | POA: Diagnosis not present

## 2021-05-05 DIAGNOSIS — Z01419 Encounter for gynecological examination (general) (routine) without abnormal findings: Secondary | ICD-10-CM | POA: Diagnosis not present

## 2021-05-05 DIAGNOSIS — R635 Abnormal weight gain: Secondary | ICD-10-CM | POA: Diagnosis not present

## 2021-05-05 DIAGNOSIS — L02214 Cutaneous abscess of groin: Secondary | ICD-10-CM | POA: Diagnosis not present

## 2021-05-05 DIAGNOSIS — J40 Bronchitis, not specified as acute or chronic: Secondary | ICD-10-CM | POA: Diagnosis not present

## 2021-05-05 DIAGNOSIS — D649 Anemia, unspecified: Secondary | ICD-10-CM

## 2021-05-05 DIAGNOSIS — E669 Obesity, unspecified: Secondary | ICD-10-CM | POA: Diagnosis not present

## 2021-05-05 DIAGNOSIS — Z0000 Encounter for general adult medical examination without abnormal findings: Secondary | ICD-10-CM

## 2021-05-05 LAB — COMPREHENSIVE METABOLIC PANEL
ALT: 22 U/L (ref 0–35)
AST: 15 U/L (ref 0–37)
Albumin: 4.1 g/dL (ref 3.5–5.2)
Alkaline Phosphatase: 108 U/L (ref 39–117)
BUN: 7 mg/dL (ref 6–23)
CO2: 25 mEq/L (ref 19–32)
Calcium: 9.2 mg/dL (ref 8.4–10.5)
Chloride: 106 mEq/L (ref 96–112)
Creatinine, Ser: 0.62 mg/dL (ref 0.40–1.20)
GFR: 111.54 mL/min (ref >60.00)
Glucose, Bld: 141 mg/dL — ABNORMAL HIGH (ref 70–99)
Potassium: 3.7 mEq/L (ref 3.5–5.1)
Sodium: 140 mEq/L (ref 135–145)
Total Bilirubin: 0.3 mg/dL (ref 0.2–1.2)
Total Protein: 6.9 g/dL (ref 6.0–8.3)

## 2021-05-05 LAB — CBC WITH DIFFERENTIAL/PLATELET
Basophils Absolute: 0 10*3/uL (ref 0.0–0.1)
Basophils Relative: 0.5 % (ref 0.0–3.0)
Eosinophils Absolute: 0.2 10*3/uL (ref 0.0–0.7)
Eosinophils Relative: 2 % (ref 0.0–5.0)
HCT: 37.8 % (ref 36.0–46.0)
Hemoglobin: 12.1 g/dL (ref 12.0–15.0)
Lymphocytes Relative: 22.1 % (ref 12.0–46.0)
Lymphs Abs: 1.9 10*3/uL (ref 0.7–4.0)
MCHC: 31.9 g/dL (ref 30.0–36.0)
MCV: 78.9 fl (ref 78.0–100.0)
Monocytes Absolute: 0.7 10*3/uL (ref 0.1–1.0)
Monocytes Relative: 8.6 % (ref 3.0–12.0)
Neutro Abs: 5.7 10*3/uL (ref 1.4–7.7)
Neutrophils Relative %: 66.8 % (ref 43.0–77.0)
Platelets: 319 10*3/uL (ref 150.0–400.0)
RBC: 4.79 Mil/uL (ref 3.87–5.11)
RDW: 13.7 % (ref 11.5–15.5)
WBC: 8.5 10*3/uL (ref 4.0–10.5)

## 2021-05-05 LAB — LIPID PANEL
Cholesterol: 206 mg/dL — ABNORMAL HIGH (ref 0–200)
HDL: 41.1 mg/dL (ref >39.00)
LDL Cholesterol: 144 mg/dL — ABNORMAL HIGH (ref 0–99)
NonHDL: 164.64
Total CHOL/HDL Ratio: 5
Triglycerides: 104 mg/dL (ref 0.0–149.0)
VLDL: 20.8 mg/dL (ref 0.0–40.0)

## 2021-05-05 LAB — HEMOGLOBIN A1C: Hgb A1c MFr Bld: 6.5 % (ref 4.6–6.5)

## 2021-05-05 LAB — TSH: TSH: 1.07 u[IU]/mL (ref 0.35–4.50)

## 2021-05-05 MED ORDER — FLOVENT HFA 110 MCG/ACT IN AERO: 1.0000 | INHALATION_SPRAY | Freq: Two times a day (BID) | RESPIRATORY_TRACT | 5 refills | Status: DC

## 2021-05-05 MED ORDER — BENZONATATE 100 MG PO CAPS: 100.0000 mg | ORAL_CAPSULE | Freq: Three times a day (TID) | ORAL | 0 refills | Status: DC | PRN

## 2021-05-05 MED ORDER — BETAMETHASONE VALERATE 0.1 % EX OINT: 1.0000 "application " | TOPICAL_OINTMENT | Freq: Two times a day (BID) | CUTANEOUS | 1 refills | Status: DC

## 2021-05-05 MED ORDER — DOXYCYCLINE HYCLATE 100 MG PO TABS: 100.0000 mg | ORAL_TABLET | Freq: Two times a day (BID) | ORAL | 0 refills | Status: DC

## 2021-05-05 NOTE — Assessment & Plan Note (Signed)
Mild, improving per report. Will rx with doxycycline 100mg  bid x 7 days.

## 2021-05-05 NOTE — Progress Notes (Signed)
Subjective:   By signing my name below, I, Shehryar Baig, attest that this documentation has been prepared under the direction and in the presence of Debbrah Alar NP. 05/05/2021      Patient ID: Savannah Henry, female    DOB: [birth year], 40 y.o.   MRN: [id number]  No chief complaint on file.   HPI Patient is in today for a comprehensive physical exam. She complains of a lump in her right groin. She also complains of skin rashes on her lower extremities. She denies having any constipation, burning, frequency, joint pain, headache, depression at this time.  Wt Readings from Last 3 Encounters:  05/05/21 216 lb (98 kg)  02/18/21 205 lb (93 kg)  08/27/18 196 lb 6.4 oz (89.1 kg)    Asthma- She complains of cough since Saturday and has been using albuterol 2x daily to manage it.  Anxiety- She has been managing her anxiety well. She has overcome some of her anxiety about working in person.  Immunizations: She has 3 Hotel manager vaccines. She occasionally gets the flu shot but did not get it this past year. She did not completed the pneumonia vaccine. Diet: She has not been controlling her diet and eats fast food. Exercise: She has stopped participating in exercises recently. Colonoscopy: Not yet completed. Dexa: Not yet completed. Pap Smear: Not yet completed. Mammogram: Not yet completed. Vision: She is not UTD on vision care. Dental: She is UTD on dental care.   Past Medical History:  Diagnosis Date  . Anxiety   . Asthma    no meds needed, maybe 1-2 times per year  . Chicken pox   . Dysrhythmia    palpitations with pregnancy  . GERD (gastroesophageal reflux disease)    no meds  . Hyperlipidemia   . Obesity   . PONV (postoperative nausea and vomiting)   . Vaginal Pap smear, abnormal 2000    Past Surgical History:  Procedure Laterality Date  . CESAREAN SECTION  2005  . CESAREAN SECTION N/A 08/06/2013   Procedure: REPEAT CESAREAN SECTION;  Surgeon: Shelly Bombard, MD;  Location: [facility];  Service: Obstetrics;  Laterality: N/A;  . CESAREAN SECTION WITH BILATERAL TUBAL LIGATION N/A 12/31/2017   Procedure: CESAREAN SECTION WITH BILATERAL TUBAL LIGATION;  Surgeon: Truett Mainland, DO;  Location: [facility];  Service: Obstetrics;  Laterality: N/A;  . CHOLECYSTECTOMY  2007  . DILATION AND CURETTAGE OF UTERUS  12/18/2015   pt reported miscarriage  . EXCISION METACARPAL MASS Right 09/21/2013   Procedure: RIGHT PALM EXCISION MASS;  Surgeon: Tennis Must, MD;  Location: [facility];  Service: Orthopedics;  Laterality: Right;    Family History  Problem Relation Age of Onset  . Arthritis Mother   . Breast cancer Mother 60  . ALS Father   . Breast cancer Maternal Aunt   . Lung cancer Maternal Uncle   . Diabetes Paternal Aunt   . Arthritis Maternal Grandmother   . Breast cancer Maternal Grandmother   . Heart disease Paternal Grandmother   . Hypertension Paternal Grandmother   . Diabetes Paternal Grandfather     Social History   Socioeconomic History  . Marital status: Married    Spouse name: Not on file  . Number of children: 2  . Years of education: Not on file  . Highest education level: Not on file  Occupational History  . Occupation: Freight forwarder  Tobacco Use  . Smoking status: Never Smoker  . Smokeless  tobacco: Never Used  Substance and Sexual Activity  . Alcohol use: No  . Drug use: No  . Sexual activity: Yes    Partners: Male    Birth control/protection: None  Other Topics Concern  . Not on file  Social History Narrative   Works for [employer]   Daughter- 2005   Daughter-2014   Engaged   Enjoys spending itme with her family, friends   Social Determinants of Radio broadcast assistant Strain: Not on file  Food Insecurity: Not on file  Transportation Needs: Not on file  Physical Activity: Not on file  Stress: Not on file  Social Connections: Not on file  Intimate Partner Violence: Not on file     Outpatient Medications Prior to Visit  Medication Sig Dispense Refill  . albuterol (VENTOLIN HFA) 108 (90 Base) MCG/ACT inhaler Inhale 2 puffs into the lungs every 6 (six) hours as needed for wheezing or shortness of breath. 25.5 g 1  . azelastine (ASTELIN) 0.1 % nasal spray Place 2 sprays into both nostrils 2 (two) times daily. 30 mL 12  . azithromycin (ZITHROMAX) 250 MG tablet Take 2 tabs day 1, then 1 tab daily 6 each 0  . predniSONE (DELTASONE) 50 MG tablet Take 1 tablet daily for 5 days. 5 tablet 0   No facility-administered medications prior to visit.    Allergies  Allergen Reactions  . Other Nausea And Vomiting and Other (See Comments)    Anesthesia     Review of Systems  Respiratory: Positive for cough.   Gastrointestinal: Negative for constipation.  Genitourinary: Negative for dysuria and frequency.  Musculoskeletal: Negative for joint pain. Myalgias: Right hip.  Skin: Positive for rash (Lower extremities).       (+)Lump on right groin  Neurological: Negative for headaches.  Psychiatric/Behavioral: Negative for depression.       Objective:    Physical Exam Exam conducted with a chaperone present.  Constitutional:      Appearance: Normal appearance.  HENT:     Head: Normocephalic and atraumatic.     Right Ear: Tympanic membrane and external ear normal.     Left Ear: Tympanic membrane and external ear normal.  Eyes:     Extraocular Movements: Extraocular movements intact.     Pupils: Pupils are equal, round, and reactive to light.     Comments: No nystagmus.  Cardiovascular:     Rate and Rhythm: Normal rate and regular rhythm.     Pulses: Normal pulses.     Heart sounds: Normal heart sounds. No murmur heard. No gallop.   Pulmonary:     Effort: Pulmonary effort is normal. No respiratory distress.     Breath sounds: Rhonchi (coarse breath sounds bilaterally, frequent coughing) present. No wheezing or rales.  Genitourinary:    Pubic Area: No rash.       Labia:        Right: No rash.      Cervix: Normal.     Uterus: Absent.      Adnexa: Right adnexa normal and left adnexa normal.     Comments: approximatedly 1 in long x 0.5 inch wide area of induration noted right groin.  No tenderness or fluctuance   Musculoskeletal:     Comments: 5/5 strength in both upper and lower strength.   Skin:    General: Skin is warm and dry.     Comments: Dry scaling rash noted right lteral shin  Neurological:     Mental Status: She  is alert and oriented to person, place, and time.     Comments: Normal patellar reflexes.  Psychiatric:        Behavior: Behavior normal.     There were no vitals taken for this visit. Wt Readings from Last 3 Encounters:  02/18/21 205 lb (93 kg)  08/27/18 196 lb 6.4 oz (89.1 kg)  05/05/18 191 lb (86.6 kg)    Diabetic Foot Exam - Simple   No data filed    Lab Results  Component Value Date   WBC 9.5 01/01/2018   HGB 8.6 (L) 01/01/2018   HCT 25.6 (L) 01/01/2018   PLT 200 01/01/2018   GLUCOSE 101 (H) 12/21/2016   CHOL 181 12/21/2016   TRIG 72.0 12/21/2016   HDL 33.50 (L) 12/21/2016   LDLCALC 133 (H) 12/21/2016   ALT 21 12/21/2016   AST 19 12/21/2016   NA 138 12/21/2016   K 3.4 (L) 12/21/2016   CL 104 12/21/2016   CREATININE 0.66 12/21/2016   BUN 8 12/21/2016   CO2 28 12/21/2016   TSH 0.88 12/21/2016   HGBA1C 5.7 (H) 07/03/2017    Lab Results  Component Value Date   TSH 0.88 12/21/2016   Lab Results  Component Value Date   WBC 9.5 01/01/2018   HGB 8.6 (L) 01/01/2018   HCT 25.6 (L) 01/01/2018   MCV 81.0 01/01/2018   PLT 200 01/01/2018   Lab Results  Component Value Date   NA 138 12/21/2016   K 3.4 (L) 12/21/2016   CO2 28 12/21/2016   GLUCOSE 101 (H) 12/21/2016   BUN 8 12/21/2016   CREATININE 0.66 12/21/2016   BILITOT 0.3 12/21/2016   ALKPHOS 106 12/21/2016   AST 19 12/21/2016   ALT 21 12/21/2016   PROT 7.4 12/21/2016   ALBUMIN 4.1 12/21/2016   CALCIUM 8.9 12/21/2016   GFR 130.42  12/21/2016   Lab Results  Component Value Date   CHOL 181 12/21/2016   Lab Results  Component Value Date   HDL 33.50 (L) 12/21/2016   Lab Results  Component Value Date   LDLCALC 133 (H) 12/21/2016   Lab Results  Component Value Date   TRIG 72.0 12/21/2016   Lab Results  Component Value Date   CHOLHDL 5 12/21/2016   Lab Results  Component Value Date   HGBA1C 5.7 (H) 07/03/2017       Assessment & Plan:   Problem List Items Addressed This Visit   None      No orders of the defined types were placed in this encounter.   I, Debbrah Alar NP, personally preformed the services described in this documentation.  All medical record entries made by the scribe were at my direction and in my presence.  I have reviewed the chart and discharge instructions (if applicable) and agree that the record reflects my personal performance and is accurate and complete. 05/05/2021   I,Shehryar Baig,acting as a Education administrator for Nance Pear, NP.,have documented all relevant documentation on the behalf of Nance Pear, NP,as directed by  Nance Pear, NP while in the presence of Nance Pear, NP.   Shehryar [employer]

## 2021-05-05 NOTE — Assessment & Plan Note (Signed)
New. Will rx with doxycycline 100 mg twice daily for 7 days.

## 2021-05-05 NOTE — Assessment & Plan Note (Signed)
Will rx betamethasone cream prn. Discussed use of eucerin or cetaphil ointment daily.

## 2021-05-05 NOTE — Patient Instructions (Addendum)
Please schedule an appointment for a routine eye exam.  Start doxycycline 100mg  twice daily for bronchitis and skin infection (groin).  Add flovent 1 puff twice daily for asthma. You may use tessalon as needed for cough. You may use betamethasone cream as needed for eczema.  Call if cough worsens or if cough does not improve.

## 2021-05-05 NOTE — Assessment & Plan Note (Signed)
Add flovent 162mcg bid, continue albuterol mdi q6 hrs prn.

## 2021-05-08 ENCOUNTER — Telehealth: Payer: Self-pay | Admitting: Family

## 2021-05-08 LAB — CYTOLOGY - PAP: Diagnosis: NEGATIVE

## 2021-05-08 MED ORDER — BLOOD GLUCOSE MONITOR KIT: PACK | 0 refills | Status: DC

## 2021-05-08 NOTE — Telephone Encounter (Signed)
Patient advised of results and scheduled to come in for appointment with provider 05-12-21

## 2021-05-08 NOTE — Telephone Encounter (Signed)
Please advise pt that her sugar shows that she is now diabetic. Please schedule her an appointment for diabetic teaching with me at her convenience. I sent an rx for a glucometer to her pharmacy. She should bring to her appointment.

## 2021-05-12 ENCOUNTER — Other Ambulatory Visit: Payer: Self-pay

## 2021-05-12 ENCOUNTER — Ambulatory Visit: Payer: Federal, State, Local not specified - PPO | Admitting: Family

## 2021-05-12 ENCOUNTER — Other Ambulatory Visit (HOSPITAL_BASED_OUTPATIENT_CLINIC_OR_DEPARTMENT_OTHER): Payer: Self-pay

## 2021-05-12 VITALS — BP 144/92 | HR 83 | Temp 100.2°F | Resp 16 | Wt 214.0 lb

## 2021-05-12 DIAGNOSIS — E118 Type 2 diabetes mellitus with unspecified complications: Secondary | ICD-10-CM | POA: Diagnosis not present

## 2021-05-12 DIAGNOSIS — E119 Type 2 diabetes mellitus without complications: Secondary | ICD-10-CM

## 2021-05-12 MED ORDER — ACCU-CHEK GUIDE VI STRP
ORAL_STRIP | 3 refills | Status: DC
  Filled 2021-05-12: qty 100, 90d supply, fill #0

## 2021-05-12 MED ORDER — ACCU-CHEK SOFTCLIX LANCETS MISC
3 refills | Status: DC
  Filled 2021-05-12: qty 100, 90d supply, fill #0

## 2021-05-12 NOTE — Patient Instructions (Addendum)
Goal sugar fasting <110 Goal sugar 2 hours after a meal <140. Check sugar once a day.   Diabetes Mellitus and Nutrition, Adult When you have diabetes, or diabetes mellitus, it is very important to have healthy eating habits because your blood sugar (glucose) levels are greatly affected by what you eat and drink. Eating healthy foods in the right amounts, at about the same times every day, can help you:  Control your blood glucose.  Lower your risk of heart disease.  Improve your blood pressure.  Reach or maintain a healthy weight. What can affect my meal plan? Every person with diabetes is different, and each person has different needs for a meal plan. Your health care provider may recommend that you work with a dietitian to make a meal plan that is best for you. Your meal plan may vary depending on factors such as:  The calories you need.  The medicines you take.  Your weight.  Your blood glucose, blood pressure, and cholesterol levels.  Your activity level.  Other health conditions you have, such as heart or kidney disease. How do carbohydrates affect me? Carbohydrates, also called carbs, affect your blood glucose level more than any other type of food. Eating carbs naturally raises the amount of glucose in your blood. Carb counting is a method for keeping track of how many carbs you eat. Counting carbs is important to keep your blood glucose at a healthy level, especially if you use insulin or take certain oral diabetes medicines. It is important to know how many carbs you can safely have in each meal. This is different for every person. Your dietitian can help you calculate how many carbs you should have at each meal and for each snack. How does alcohol affect me? Alcohol can cause a sudden decrease in blood glucose (hypoglycemia), especially if you use insulin or take certain oral diabetes medicines. Hypoglycemia can be a life-threatening condition. Symptoms of hypoglycemia,  such as sleepiness, dizziness, and confusion, are similar to symptoms of having too much alcohol.  Do not drink alcohol if: ? Your health care provider tells you not to drink. ? You are pregnant, may be pregnant, or are planning to become pregnant.  If you drink alcohol: ? Do not drink on an empty stomach. ? Limit how much you use to:  0-1 drink a day for women.  0-2 drinks a day for men. ? Be aware of how much alcohol is in your drink. In the [place] one drink equals one 12 oz bottle of beer (355 mL), one 5 oz glass of wine (148 mL), or one 1 oz glass of hard liquor (44 mL). ? Keep yourself hydrated with water, diet soda, or unsweetened iced tea.  Keep in mind that regular soda, juice, and other mixers may contain a lot of sugar and must be counted as carbs. What are tips for following this plan? Reading food labels  Start by checking the serving size on the "Nutrition Facts" label of packaged foods and drinks. The amount of calories, carbs, fats, and other nutrients listed on the label is based on one serving of the item. Many items contain more than one serving per package.  Check the total grams (g) of carbs in one serving. You can calculate the number of servings of carbs in one serving by dividing the total carbs by 15. For example, if a food has 30 g of total carbs per serving, it would be equal to 2 servings of carbs.  Check the number of grams (g) of saturated fats and trans fats in one serving. Choose foods that have a low amount or none of these fats.  Check the number of milligrams (mg) of salt (sodium) in one serving. Most people should limit total sodium intake to less than 2,300 mg per day.  Always check the nutrition information of foods labeled as "low-fat" or "nonfat." These foods may be higher in added sugar or refined carbs and should be avoided.  Talk to your dietitian to identify your daily goals for nutrients listed on the label. Shopping  Avoid buying canned,  pre-made, or processed foods. These foods tend to be high in fat, sodium, and added sugar.  Shop around the outside edge of the grocery store. This is where you will most often find fresh fruits and vegetables, bulk grains, fresh meats, and fresh dairy. Cooking  Use low-heat cooking methods, such as baking, instead of high-heat cooking methods like deep frying.  Cook using healthy oils, such as olive, canola, or sunflower oil.  Avoid cooking with butter, cream, or high-fat meats. Meal planning  Eat meals and snacks regularly, preferably at the same times every day. Avoid going long periods of time without eating.  Eat foods that are high in fiber, such as fresh fruits, vegetables, beans, and whole grains. Talk with your dietitian about how many servings of carbs you can eat at each meal.  Eat 4-6 oz (112-168 g) of lean protein each day, such as lean meat, chicken, fish, eggs, or tofu. One ounce (oz) of lean protein is equal to: ? 1 oz (28 g) of meat, chicken, or fish. ? 1 egg. ?  cup (62 g) of tofu.  Eat some foods each day that contain healthy fats, such as avocado, nuts, seeds, and fish.   What foods should I eat? Fruits Berries. Apples. Oranges. Peaches. Apricots. Plums. Grapes. Mango. Papaya. Pomegranate. Kiwi. Cherries. Vegetables Lettuce. Spinach. Leafy greens, including kale, chard, collard greens, and mustard greens. Beets. Cauliflower. Cabbage. Broccoli. Carrots. Green beans. Tomatoes. Peppers. Onions. Cucumbers. Brussels sprouts. Grains Whole grains, such as whole-wheat or whole-grain bread, crackers, tortillas, cereal, and pasta. Unsweetened oatmeal. Quinoa. Brown or wild rice. Meats and other proteins Seafood. Poultry without skin. Lean cuts of poultry and beef. Tofu. Nuts. Seeds. Dairy Low-fat or fat-free dairy products such as milk, yogurt, and cheese. The items listed above may not be a complete list of foods and beverages you can eat. Contact a dietitian for more  information. What foods should I avoid? Fruits Fruits canned with syrup. Vegetables Canned vegetables. Frozen vegetables with butter or cream sauce. Grains Refined white flour and flour products such as bread, pasta, snack foods, and cereals. Avoid all processed foods. Meats and other proteins Fatty cuts of meat. Poultry with skin. Breaded or fried meats. Processed meat. Avoid saturated fats. Dairy Full-fat yogurt, cheese, or milk. Beverages Sweetened drinks, such as soda or iced tea. The items listed above may not be a complete list of foods and beverages you should avoid. Contact a dietitian for more information. Questions to ask a health care provider  Do I need to meet with a diabetes educator?  Do I need to meet with a dietitian?  What number can I call if I have questions?  When are the best times to check my blood glucose? Where to find more information:  American Diabetes Association: diabetes.org  Academy of Nutrition and Dietetics: www.eatright.[employer] of Diabetes and Digestive and Kidney Diseases:  [employer]  Association of Diabetes Care and Education Specialists: www.diabeteseducator.org Summary  It is important to have healthy eating habits because your blood sugar (glucose) levels are greatly affected by what you eat and drink.  A healthy meal plan will help you control your blood glucose and maintain a healthy lifestyle.  Your health care provider may recommend that you work with a dietitian to make a meal plan that is best for you.  Keep in mind that carbohydrates (carbs) and alcohol have immediate effects on your blood glucose levels. It is important to count carbs and to use alcohol carefully. This information is not intended to replace advice given to you by your health care provider. Make sure you discuss any questions you have with your health care provider. Document Revised: 11/10/2019 Document Reviewed: 11/10/2019 Elsevier  Patient Education  2021 [employer].

## 2021-05-12 NOTE — Assessment & Plan Note (Signed)
Discussed diabetic diet, exercise, weight loss.  CMA taught pt how to use glucometer. We discussed glycemic goals.  She declines pneumovax at this time.   BP was elevated today, she was extremely anxious at today's visit. Plan to recheck at her 3 month follow up.  BP Readings from Last 3 Encounters:  05/12/21 (!) 144/92  05/05/21 137/87  08/27/18 117/89

## 2021-05-12 NOTE — Progress Notes (Signed)
Subjective:   By signing my name below, I, Shehryar Baig, attest that this documentation has been prepared under the direction and in the presence of Debbrah Alar NP. 05/12/2021      Patient ID: Savannah Henry, female    DOB: 10-15-1981, 40 y.o.   MRN: [id number]  No chief complaint on file.   HPI Patient is in today for a office visit.  Diabetes- Her A1C measured 6.5 during her last visit. She has purchased a glucose machine to measure her blood sugar level.  Lab Results  Component Value Date   HGBA1C 6.5 05/05/2021   Diet- She is willing to go on a diet to manage her diabetes. Iron- Her iron levels are normal during her last reading.  Immunization- She is not interested in getting the pneumonia vaccine at this time.    Past Medical History:  Diagnosis Date  . Anxiety   . Asthma    no meds needed, maybe 1-2 times per year  . Chicken pox   . Dysrhythmia    palpitations with pregnancy  . GERD (gastroesophageal reflux disease)    no meds  . Hyperlipidemia   . Obesity   . PONV (postoperative nausea and vomiting)   . Vaginal Pap smear, abnormal 2000    Past Surgical History:  Procedure Laterality Date  . CESAREAN SECTION  2005  . CESAREAN SECTION N/A 08/06/2013   Procedure: REPEAT CESAREAN SECTION;  Surgeon: Shelly Bombard, MD;  Location: [facility];  Service: Obstetrics;  Laterality: N/A;  . CESAREAN SECTION WITH BILATERAL TUBAL LIGATION N/A 12/31/2017   Procedure: CESAREAN SECTION WITH BILATERAL TUBAL LIGATION;  Surgeon: Truett Mainland, DO;  Location: [place];  Service: Obstetrics;  Laterality: N/A;  . CHOLECYSTECTOMY  2007  . DILATION AND CURETTAGE OF UTERUS  12/18/2015   pt reported miscarriage  . EXCISION METACARPAL MASS Right 09/21/2013   Procedure: RIGHT PALM EXCISION MASS;  Surgeon: Tennis Must, MD;  Location: [place];  Service: Orthopedics;  Laterality: Right;    Family History  Problem Relation Age of Onset  .  Arthritis Mother   . Breast cancer Mother 26  . ALS Father   . Breast cancer Maternal Aunt   . Lung cancer Maternal Uncle   . Diabetes Paternal Aunt   . Arthritis Maternal Grandmother   . Breast cancer Maternal Grandmother   . Heart disease Paternal Grandmother   . Hypertension Paternal Grandmother   . Diabetes Paternal Grandfather     Social History   Socioeconomic History  . Marital status: Married    Spouse name: Not on file  . Number of children: 2  . Years of education: Not on file  . Highest education level: Not on file  Occupational History  . Occupation: Freight forwarder  Tobacco Use  . Smoking status: Never Smoker  . Smokeless tobacco: Never Used  Substance and Sexual Activity  . Alcohol use: No  . Drug use: No  . Sexual activity: Yes    Partners: Male    Birth control/protection: None  Other Topics Concern  . Not on file  Social History Narrative   Works for [employer]   Daughter- 2005   Daughter-2014   Engaged   Enjoys spending itme with her family, friends   Social Determinants of Health   Financial Resource Strain: Not on file  Food Insecurity: Not on file  Transportation Needs: Not on file  Physical Activity: Not on file  Stress: Not on  file  Social Connections: Not on file  Intimate Partner Violence: Not on file    Outpatient Medications Prior to Visit  Medication Sig Dispense Refill  . albuterol (VENTOLIN HFA) 108 (90 Base) MCG/ACT inhaler Inhale 2 puffs into the lungs every 6 (six) hours as needed for wheezing or shortness of breath. 25.5 g 1  . benzonatate (TESSALON) 100 MG capsule Take 1 capsule (100 mg total) by mouth 3 (three) times daily as needed. 20 capsule 0  . betamethasone valerate ointment (VALISONE) 0.1 % Apply 1 application topically 2 (two) times daily. 30 g 1  . blood glucose meter kit and supplies KIT Dispense based on patient and insurance preference. Use up to four times daily as directed. 1 each 0  . doxycycline (VIBRA-TABS)  100 MG tablet Take 1 tablet (100 mg total) by mouth 2 (two) times daily. 14 tablet 0  . fluticasone (FLOVENT HFA) 110 MCG/ACT inhaler Inhale 1 puff into the lungs in the morning and at bedtime. 1 each 5   No facility-administered medications prior to visit.    Allergies  Allergen Reactions  . Other Nausea And Vomiting and Other (See Comments)    Anesthesia     ROS     Objective:    Physical Exam Constitutional:      General: She is not in acute distress.    Appearance: Normal appearance. She is not ill-appearing.  HENT:     Head: Normocephalic and atraumatic.     Right Ear: External ear normal.     Left Ear: External ear normal.  Cardiovascular:     Rate and Rhythm: Normal rate and regular rhythm.     Pulses: Normal pulses.          Dorsalis pedis pulses are 2+ on the right side and 2+ on the left side.       Posterior tibial pulses are 2+ on the right side and 2+ on the left side.     Heart sounds: Normal heart sounds. No murmur heard. No gallop.   Pulmonary:     Effort: Pulmonary effort is normal. No respiratory distress.     Breath sounds: Normal breath sounds. No wheezing, rhonchi or rales.  Skin:    General: Skin is warm and dry.  Neurological:     Mental Status: She is alert and oriented to person, place, and time.  Psychiatric:        Behavior: Behavior normal.     There were no vitals taken for this visit. Wt Readings from Last 3 Encounters:  05/05/21 216 lb (98 kg)  02/18/21 205 lb (93 kg)  08/27/18 196 lb 6.4 oz (89.1 kg)    Diabetic Foot Exam - Simple   No data filed    Lab Results  Component Value Date   WBC 8.5 05/05/2021   HGB 12.1 05/05/2021   HCT 37.8 05/05/2021   PLT 319.0 05/05/2021   GLUCOSE 141 (H) 05/05/2021   CHOL 206 (H) 05/05/2021   TRIG 104.0 05/05/2021   HDL 41.10 05/05/2021   LDLCALC 144 (H) 05/05/2021   ALT 22 05/05/2021   AST 15 05/05/2021   NA 140 05/05/2021   K 3.7 05/05/2021   CL 106 05/05/2021   CREATININE 0.62  05/05/2021   BUN 7 05/05/2021   CO2 25 05/05/2021   TSH 1.07 05/05/2021   HGBA1C 6.5 05/05/2021    Lab Results  Component Value Date   TSH 1.07 05/05/2021   Lab Results  Component Value Date  WBC 8.5 05/05/2021   HGB 12.1 05/05/2021   HCT 37.8 05/05/2021   MCV 78.9 05/05/2021   PLT 319.0 05/05/2021   Lab Results  Component Value Date   NA 140 05/05/2021   K 3.7 05/05/2021   CO2 25 05/05/2021   GLUCOSE 141 (H) 05/05/2021   BUN 7 05/05/2021   CREATININE 0.62 05/05/2021   BILITOT 0.3 05/05/2021   ALKPHOS 108 05/05/2021   AST 15 05/05/2021   ALT 22 05/05/2021   PROT 6.9 05/05/2021   ALBUMIN 4.1 05/05/2021   CALCIUM 9.2 05/05/2021   GFR 111.54 05/05/2021   Lab Results  Component Value Date   CHOL 206 (H) 05/05/2021   Lab Results  Component Value Date   HDL 41.10 05/05/2021   Lab Results  Component Value Date   LDLCALC 144 (H) 05/05/2021   Lab Results  Component Value Date   TRIG 104.0 05/05/2021   Lab Results  Component Value Date   CHOLHDL 5 05/05/2021   Lab Results  Component Value Date   HGBA1C 6.5 05/05/2021       Assessment & Plan:   Problem List Items Addressed This Visit   None      No orders of the defined types were placed in this encounter.   I, Debbrah Alar NP, personally preformed the services described in this documentation.  All medical record entries made by the scribe were at my direction and in my presence.  I have reviewed the chart and discharge instructions (if applicable) and agree that the record reflects my personal performance and is accurate and complete. 05/12/2021   I,Shehryar Baig,acting as a Education administrator for Nance Pear, NP.,have documented all relevant documentation on the behalf of Nance Pear, NP,as directed by  Nance Pear, NP while in the presence of Nance Pear, NP.  Shehryar [employer]

## 2021-05-19 ENCOUNTER — Encounter: Payer: Self-pay | Admitting: Family

## 2021-05-19 ENCOUNTER — Other Ambulatory Visit: Payer: Self-pay

## 2021-05-19 ENCOUNTER — Other Ambulatory Visit (HOSPITAL_BASED_OUTPATIENT_CLINIC_OR_DEPARTMENT_OTHER): Payer: Self-pay

## 2021-05-19 ENCOUNTER — Ambulatory Visit: Payer: Federal, State, Local not specified - PPO | Admitting: Family

## 2021-05-19 ENCOUNTER — Telehealth: Payer: Self-pay

## 2021-05-19 VITALS — BP 146/90 | HR 106 | Temp 97.3°F | Resp 16 | Ht 63.5 in | Wt 210.0 lb

## 2021-05-19 DIAGNOSIS — I10 Essential (primary) hypertension: Secondary | ICD-10-CM

## 2021-05-19 DIAGNOSIS — E119 Type 2 diabetes mellitus without complications: Secondary | ICD-10-CM

## 2021-05-19 MED ORDER — AMLODIPINE BESYLATE 5 MG PO TABS: 5.0000 mg | ORAL_TABLET | Freq: Every day | ORAL | 3 refills | Status: DC

## 2021-05-19 MED ORDER — LISINOPRIL 10 MG PO TABS
10.0000 mg | ORAL_TABLET | Freq: Every day | ORAL | 0 refills | Status: DC
  Filled 2021-05-19: qty 90, 90d supply, fill #0

## 2021-05-19 NOTE — Assessment & Plan Note (Addendum)
BP Readings from Last 3 Encounters:  05/19/21 (!) 146/90  05/12/21 (!) 144/92  05/05/21 137/87   Wt Readings from Last 3 Encounters:  05/19/21 210 lb (95.3 kg)  05/12/21 214 lb (97.1 kg)  05/05/21 216 lb (98 kg)   New. Discussed DASH diet.  Will initiate lisinopril 10mg  once daily for bp and renal protection.

## 2021-05-19 NOTE — Patient Instructions (Addendum)
Please call [facility] to schedule an appointment with a counselor:  [phone]. Start lisinopril 10mg  once daily.    DASH Eating Plan DASH stands for Dietary Approaches to Stop Hypertension. The DASH eating plan is a healthy eating plan that has been shown to:  Reduce high blood pressure (hypertension).  Reduce your risk for type 2 diabetes, heart disease, and stroke.  Help with weight loss. What are tips for following this plan? Reading food labels  Check food labels for the amount of salt (sodium) per serving. Choose foods with less than 5 percent of the Daily Value of sodium. Generally, foods with less than 300 milligrams (mg) of sodium per serving fit into this eating plan.  To find whole grains, look for the word "whole" as the first word in the ingredient list. Shopping  Buy products labeled as "low-sodium" or "no salt added."  Buy fresh foods. Avoid canned foods and pre-made or frozen meals. Cooking  Avoid adding salt when cooking. Use salt-free seasonings or herbs instead of table salt or sea salt. Check with your health care provider or pharmacist before using salt substitutes.  Do not fry foods. Cook foods using healthy methods such as baking, boiling, grilling, roasting, and broiling instead.  Cook with heart-healthy oils, such as olive, canola, avocado, soybean, or sunflower oil. Meal planning  Eat a balanced diet that includes: ? 4 or more servings of fruits and 4 or more servings of vegetables each day. Try to fill one-half of your plate with fruits and vegetables. ? 6-8 servings of whole grains each day. ? Less than 6 oz (170 g) of lean meat, poultry, or fish each day. A 3-oz (85-g) serving of meat is about the same size as a deck of cards. One egg equals 1 oz (28 g). ? 2-3 servings of low-fat dairy each day. One serving is 1 cup (237 mL). ? 1 serving of nuts, seeds, or beans 5 times each week. ? 2-3 servings of heart-healthy fats. Healthy  fats called omega-3 fatty acids are found in foods such as walnuts, flaxseeds, fortified milks, and eggs. These fats are also found in cold-water fish, such as sardines, salmon, and mackerel.  Limit how much you eat of: ? Canned or prepackaged foods. ? Food that is high in trans fat, such as some fried foods. ? Food that is high in saturated fat, such as fatty meat. ? Desserts and other sweets, sugary drinks, and other foods with added sugar. ? Full-fat dairy products.  Do not salt foods before eating.  Do not eat more than 4 egg yolks a week.  Try to eat at least 2 vegetarian meals a week.  Eat more home-cooked food and less restaurant, buffet, and fast food.   Lifestyle  When eating at a restaurant, ask that your food be prepared with less salt or no salt, if possible.  If you drink alcohol: ? Limit how much you use to:  0-1 drink a day for women who are not pregnant.  0-2 drinks a day for men. ? Be aware of how much alcohol is in your drink. In the [place] one drink equals one 12 oz bottle of beer (355 mL), one 5 oz glass of wine (148 mL), or one 1 oz glass of hard liquor (44 mL). General information  Avoid eating more than 2,300 mg of salt a day. If you have hypertension, you may need to reduce your sodium intake to 1,500 mg a day.  Work with  your health care provider to maintain a healthy body weight or to lose weight. Ask what an ideal weight is for you.  Get at least 30 minutes of exercise that causes your heart to beat faster (aerobic exercise) most days of the week. Activities may include walking, swimming, or biking.  Work with your health care provider or dietitian to adjust your eating plan to your individual calorie needs. What foods should I eat? Fruits All fresh, dried, or frozen fruit. Canned fruit in natural juice (without added sugar). Vegetables Fresh or frozen vegetables (raw, steamed, roasted, or grilled). Low-sodium or reduced-sodium tomato and vegetable  juice. Low-sodium or reduced-sodium tomato sauce and tomato paste. Low-sodium or reduced-sodium canned vegetables. Grains Whole-grain or whole-wheat bread. Whole-grain or whole-wheat pasta. Brown rice. [place]row. Bulgur. Whole-grain and low-sodium cereals. Pita bread. Low-fat, low-sodium crackers. Whole-wheat flour tortillas. Meats and other proteins Skinless chicken or [place]. Ground chicken or [place]. Pork with fat trimmed off. Fish and seafood. Egg whites. Dried beans, peas, or lentils. Unsalted nuts, nut butters, and seeds. Unsalted canned beans. Lean cuts of beef with fat trimmed off. Low-sodium, lean precooked or cured meat, such as sausages or meat loaves. Dairy Low-fat (1%) or fat-free (skim) milk. Reduced-fat, low-fat, or fat-free cheeses. Nonfat, low-sodium ricotta or cottage cheese. Low-fat or nonfat yogurt. Low-fat, low-sodium cheese. Fats and oils Soft margarine without trans fats. Vegetable oil. Reduced-fat, low-fat, or light mayonnaise and salad dressings (reduced-sodium). Canola, safflower, olive, avocado, soybean, and sunflower oils. Avocado. Seasonings and condiments Herbs. Spices. Seasoning mixes without salt. Other foods Unsalted popcorn and pretzels. Fat-free sweets. The items listed above may not be a complete list of foods and beverages you can eat. Contact a dietitian for more information. What foods should I avoid? Fruits Canned fruit in a light or heavy syrup. Fried fruit. Fruit in cream or butter sauce. Vegetables Creamed or fried vegetables. Vegetables in a cheese sauce. Regular canned vegetables (not low-sodium or reduced-sodium). Regular canned tomato sauce and paste (not low-sodium or reduced-sodium). Regular tomato and vegetable juice (not low-sodium or reduced-sodium). Angie Fava. Olives. Grains Baked goods made with fat, such as croissants, muffins, or some breads. Dry pasta or rice meal packs. Meats and other proteins Fatty cuts of meat. Ribs. Fried meat.  Berniece Salines. Bologna, salami, and other precooked or cured meats, such as sausages or meat loaves. Fat from the back of a pig (fatback). Bratwurst. Salted nuts and seeds. Canned beans with added salt. Canned or smoked fish. Whole eggs or egg yolks. Chicken or [place] with skin. Dairy Whole or 2% milk, cream, and half-and-half. Whole or full-fat cream cheese. Whole-fat or sweetened yogurt. Full-fat cheese. Nondairy creamers. Whipped toppings. Processed cheese and cheese spreads. Fats and oils Butter. Stick margarine. Lard. Shortening. Ghee. Bacon fat. Tropical oils, such as coconut, palm kernel, or palm oil. Seasonings and condiments Onion salt, garlic salt, seasoned salt, table salt, and sea salt. Worcestershire sauce. Tartar sauce. Barbecue sauce. Teriyaki sauce. Soy sauce, including reduced-sodium. Steak sauce. Canned and packaged gravies. Fish sauce. Oyster sauce. Cocktail sauce. Store-bought horseradish. Ketchup. Mustard. Meat flavorings and tenderizers. Bouillon cubes. Hot sauces. Pre-made or packaged marinades. Pre-made or packaged taco seasonings. Relishes. Regular salad dressings. Other foods Salted popcorn and pretzels. The items listed above may not be a complete list of foods and beverages you should avoid. Contact a dietitian for more information. Where to find more information  National Heart, Lung, and Blood Institute: https://wilson-eaton.com/  American Heart Association: www.heart.org  Academy of Nutrition and Dietetics: www.eatright.org  [place]: www.kidney.org Summary  The DASH eating plan is a healthy eating plan that has been shown to reduce high blood pressure (hypertension). It may also reduce your risk for type 2 diabetes, heart disease, and stroke.  When on the DASH eating plan, aim to eat more fresh fruits and vegetables, whole grains, lean proteins, low-fat dairy, and heart-healthy fats.  With the DASH eating plan, you should limit salt (sodium) intake to 2,300  mg a day. If you have hypertension, you may need to reduce your sodium intake to 1,500 mg a day.  Work with your health care provider or dietitian to adjust your eating plan to your individual calorie needs. This information is not intended to replace advice given to you by your health care provider. Make sure you discuss any questions you have with your health care provider. Document Revised: 11/06/2019 Document Reviewed: 11/06/2019 Elsevier Patient Education  2021 [employer].

## 2021-05-19 NOTE — Assessment & Plan Note (Signed)
She is very anxious about her new diagnosis of DM and HTN.  Will refer to nutritionist.  I also think that she could benefit from meeting with a counselor and I gave her a number to call. She declines pneumovax at this time.

## 2021-05-19 NOTE — Addendum Note (Signed)
Addended by: Debbrah Alar on: 05/19/2021 04:39 PM   Modules accepted: Orders

## 2021-05-19 NOTE — Telephone Encounter (Signed)
Pt was just seen (05/19/2021) and Rx Lisinorpil 10 mg. After reading about the "side effects" pt decided that she wants to have it changed to Amlodipine. Please advise the pt. -JMA

## 2021-05-19 NOTE — Progress Notes (Signed)
Subjective:   By signing my name below, I, Shehryar Baig, attest that this documentation has been prepared under the direction and in the presence of Debbrah Alar NP. 05/19/2021     Patient ID: Savannah Henry, female    DOB: 1981/01/26, 40 y.o.   MRN: [id number]  No chief complaint on file.   HPI Patient is in today for a office visit. She has a surgical history of tubal ligation. She is willing to get pneumonia vaccine after she controls her blood pressure.   Blood pressure- She reports having elevated blood pressure during at home measurements and her blood pressure is elevated during this visit. She has a family history of high blood pressure. She does not take blood pressure medication at this time. She was recently diagnosed with diabetes.  BP Readings from Last 3 Encounters:  05/19/21 (!) 146/90  05/12/21 (!) 144/92  05/05/21 137/87   Anxiety- She complains of having pressure in her head. The symptoms worsens when under stress. She reports having increase blood pressure has increased her anxiety. She has hesitation with taking medication and prefers to control her diet and exercise to manage her elevated blood pressure. She has a recent history of anxiety. She is requesting anxiety medication. She is willing to go to counseling. Diet- She controls her salt intake. She has lost most of her appetite because she is anxious from having elevated blood pressure. Blood sugar- She controls her blood sugar level well at this time. Lab Results  Component Value Date   HGBA1C 6.5 05/05/2021       Health Maintenance Due  Topic Date Due  . PNEUMOCOCCAL POLYSACCHARIDE VACCINE AGE 56-64 HIGH RISK  Never done  . Pneumococcal Vaccine 39-46 Years old (1 of 2 - PPSV23) Never done  . OPHTHALMOLOGY EXAM  Never done  . URINE MICROALBUMIN  Never done  . Hepatitis C Screening  Never done    Past Medical History:  Diagnosis Date  . Anxiety   . Asthma    no meds needed, maybe 1-2 times  per year  . Chicken pox   . Dysrhythmia    palpitations with pregnancy  . GERD (gastroesophageal reflux disease)    no meds  . Hyperlipidemia   . Obesity   . PONV (postoperative nausea and vomiting)   . Vaginal Pap smear, abnormal 2000    Past Surgical History:  Procedure Laterality Date  . CESAREAN SECTION  2005  . CESAREAN SECTION N/A 08/06/2013   Procedure: REPEAT CESAREAN SECTION;  Surgeon: Shelly Bombard, MD;  Location: [facility];  Service: Obstetrics;  Laterality: N/A;  . CESAREAN SECTION WITH BILATERAL TUBAL LIGATION N/A 12/31/2017   Procedure: CESAREAN SECTION WITH BILATERAL TUBAL LIGATION;  Surgeon: Truett Mainland, DO;  Location: [place];  Service: Obstetrics;  Laterality: N/A;  . CHOLECYSTECTOMY  2007  . DILATION AND CURETTAGE OF UTERUS  12/18/2015   pt reported miscarriage  . EXCISION METACARPAL MASS Right 09/21/2013   Procedure: RIGHT PALM EXCISION MASS;  Surgeon: Tennis Must, MD;  Location: [place];  Service: Orthopedics;  Laterality: Right;    Family History  Problem Relation Age of Onset  . Arthritis Mother   . Breast cancer Mother 75  . ALS Father   . Breast cancer Maternal Aunt   . Lung cancer Maternal Uncle   . Diabetes Paternal Aunt   . Arthritis Maternal Grandmother   . Breast cancer Maternal Grandmother   . Heart disease Paternal  Grandmother   . Hypertension Paternal Grandmother   . Diabetes Paternal Grandfather     Social History   Socioeconomic History  . Marital status: Married    Spouse name: Not on file  . Number of children: 2  . Years of education: Not on file  . Highest education level: Not on file  Occupational History  . Occupation: Freight forwarder  Tobacco Use  . Smoking status: Never Smoker  . Smokeless tobacco: Never Used  Substance and Sexual Activity  . Alcohol use: No  . Drug use: No  . Sexual activity: Yes    Partners: Male    Birth control/protection: None  Other Topics Concern  . Not on file   Social History Narrative   Works for social [employer]   Daughter- 2005   Daughter-2014   Engaged   Enjoys spending itme with her family, friends   Social Determinants of Radio broadcast assistant Strain: Not on file  Food Insecurity: Not on file  Transportation Needs: Not on file  Physical Activity: Not on file  Stress: Not on file  Social Connections: Not on file  Intimate Partner Violence: Not on file    Outpatient Medications Prior to Visit  Medication Sig Dispense Refill  . Accu-Chek Softclix Lancets lancets check glucose once daily 100 each 3  . albuterol (VENTOLIN HFA) 108 (90 Base) MCG/ACT inhaler Inhale 2 puffs into the lungs every 6 (six) hours as needed for wheezing or shortness of breath. 25.5 g 1  . betamethasone valerate ointment (VALISONE) 0.1 % Apply 1 application topically 2 (two) times daily. 30 g 1  . blood glucose meter kit and supplies KIT Dispense based on patient and insurance preference. Use up to four times daily as directed. 1 each 0  . doxycycline (VIBRA-TABS) 100 MG tablet Take 1 tablet (100 mg total) by mouth 2 (two) times daily. 14 tablet 0  . fluticasone (FLOVENT HFA) 110 MCG/ACT inhaler Inhale 1 puff into the lungs in the morning and at bedtime. 1 each 5  . glucose blood (ACCU-CHEK GUIDE) test strip check glucose one time daily 100 strip 3   No facility-administered medications prior to visit.    Allergies  Allergen Reactions  . Other Nausea And Vomiting and Other (See Comments)    Anesthesia     Review of Systems  Psychiatric/Behavioral: The patient is nervous/anxious.        Objective:    Physical Exam Constitutional:      General: She is not in acute distress.    Appearance: Normal appearance. She is not ill-appearing.  HENT:     Head: Normocephalic and atraumatic.     Right Ear: External ear normal.     Left Ear: External ear normal.  Eyes:     Extraocular Movements: Extraocular movements intact.     Pupils: Pupils are equal,  round, and reactive to light.  Cardiovascular:     Rate and Rhythm: Normal rate and regular rhythm.     Pulses: Normal pulses.     Heart sounds: Normal heart sounds. No murmur heard. No gallop.      Comments: Elevated blood pressure Pulmonary:     Effort: Pulmonary effort is normal. No respiratory distress.     Breath sounds: Normal breath sounds. No wheezing, rhonchi or rales.  Skin:    General: Skin is warm and dry.  Neurological:     Mental Status: She is alert and oriented to person, place, and time.  Psychiatric:  Behavior: Behavior normal.     There were no vitals taken for this visit. Wt Readings from Last 3 Encounters:  05/12/21 214 lb (97.1 kg)  05/05/21 216 lb (98 kg)  02/18/21 205 lb (93 kg)       Assessment & Plan:   Problem List Items Addressed This Visit   None      No orders of the defined types were placed in this encounter.   I, Debbrah Alar NP, personally preformed the services described in this documentation.  All medical record entries made by the scribe were at my direction and in my presence.  I have reviewed the chart and discharge instructions (if applicable) and agree that the record reflects my personal performance and is accurate and complete. 05/19/2021   I,Shehryar Baig,acting as a Education administrator for Nance Pear, NP.,have documented all relevant documentation on the behalf of Nance Pear, NP,as directed by  Nance Pear, NP while in the presence of Nance Pear, NP.   Shehryar [employer]

## 2021-05-22 LAB — MICROALBUMIN / CREATININE URINE RATIO
Creatinine,U: 131.9 mg/dL
Microalb Creat Ratio: 0.8 mg/g (ref 0.0–30.0)
Microalb, Ur: 1 mg/dL (ref 0.0–1.9)

## 2021-05-23 ENCOUNTER — Encounter: Payer: Self-pay | Admitting: Dietician

## 2021-05-23 ENCOUNTER — Encounter: Payer: Federal, State, Local not specified - PPO | Attending: Family | Admitting: Dietician

## 2021-05-23 ENCOUNTER — Other Ambulatory Visit: Payer: Self-pay

## 2021-05-23 DIAGNOSIS — E119 Type 2 diabetes mellitus without complications: Secondary | ICD-10-CM | POA: Diagnosis not present

## 2021-05-23 NOTE — Patient Instructions (Addendum)
Continue to work on increasing your physical activity until you are to get to 30 minutes a day, 5 days a week, without allowing more than 48 hours in between bouts of moderate intensity physical activity.  Remember the three things that can raise your blood sugar besides food: Stress, Injury, and Illness  Check your blood sugar either fasting in the morning, or 2 hours after you start eating a meal Goals:  80-110 Fasting >140 2 hrs after starting a meal  Work towards eating three meals a day, about 5-6 hours apart!  Begin to recognize carbohydrates in your food choices!  Begin to build your meals using the proportions of the Balanced Plate. . First, select your carb choice(s) for the meal. . Next, select your source of protein to pair with your carb choice(s). . Finally, complete the remaining half of your meal with a variety of non-starchy vegetables.

## 2021-05-23 NOTE — Progress Notes (Signed)
Diabetes Self-Management Education  Visit Type: First/Initial  Appt. Start Time: 1530 Appt. End Time: 1640  05/23/2021  [name], identified by name and date of birth, is a 40 y.o. female with a diagnosis of Diabetes: Type 2.   ASSESSMENT  Height 5\' 3"  (1.6 m), weight 207 lb 12.8 oz (94.3 kg), currently breastfeeding. Body mass index is 36.81 kg/m.   Pt was recently diagnosed with diabetes, hypertension, and high cholesterol within the last couple of weeks. Pt was given instruction on how to use their AccuChek blood glucose meter and checks once a day, sometimes fasting or after a meal.  FBG- ~95 CBG - 100-120 Pt is not on any diabetes medication. Pt reports having anxiety. Pt was freaked out with their diagnosis and has not been eating in the past couple of weeks. Pt has been drinking a lot of water and avoiding carbs and meals. Pt has lost 7 pounds in the past weeks. Pt [facility], currently doing ~20 minutes of dancing/zumba at home and walking 2-3 miles in the evenings.    Diabetes Self-Management Education - 05/23/21 1550      Visit Information   Visit Type First/Initial      Initial Visit   Diabetes Type Type 2    Are you currently following a meal plan? No    Are you taking your medications as prescribed? Not on Medications    Date Diagnosed May 2022      Health Coping   How would you rate your overall health? Fair      Psychosocial Assessment   Patient Belief/Attitude about Diabetes Afraid    Self-care barriers None    Self-management support Family;Doctor's office    Other persons present Patient    Patient Concerns Glycemic Control;Healthy Lifestyle;Nutrition/Meal planning    Special Needs None    Preferred Learning Style No preference indicated    Learning Readiness Ready    How often do you need to have someone help you when you read instructions, pamphlets, or other written materials from your doctor or pharmacy? 1 -  Never    What is the last grade level you completed in school? Bachelors degree      Pre-Education Assessment   Patient understands the diabetes disease and treatment process. Needs Instruction    Patient understands incorporating nutritional management into lifestyle. Needs Instruction    Patient undertands incorporating physical activity into lifestyle. Needs Instruction    Patient understands using medications safely. Needs Instruction    Patient understands monitoring blood glucose, interpreting and using results Needs Instruction    Patient understands prevention, detection, and treatment of acute complications. Needs Instruction    Patient understands prevention, detection, and treatment of chronic complications. Needs Instruction    Patient understands how to develop strategies to address psychosocial issues. Needs Instruction    Patient understands how to develop strategies to promote health/change behavior. Needs Instruction      Complications   Last HgB A1C per patient/outside source 6.5 %   05/05/2021   How often do you check your blood sugar? 1-2 times/day    Fasting Blood glucose range (mg/dL) 70-129    Postprandial Blood glucose range (mg/dL) 70-129    Have you had a dilated eye exam in the past 12 months? No    Have you had a dental exam in the past 12 months? Yes    Are you checking your feet? Yes    How many days per  week are you checking your feet? 3      Dietary Intake   Breakfast none    Snack (morning) none    Lunch 1 carb balance tortillas, 2 slices lamb meat, tzatziki sacue, combo salad    Snack (afternoon) water    Dinner Kava 1/2 salad with mixed greens, mixed veggies, chicken, feta cheese, hummus, 1/2 falafel, water    Snack (evening) none    Beverage(s) ~100 oz water      Exercise   Exercise Type ADL's    How many days per week to you exercise? 4    How many minutes per day do you exercise? 20    Total minutes per week of exercise 80      Patient  Education   Previous Diabetes Education No    Disease state  Factors that contribute to the development of diabetes;Explored patient's options for treatment of their diabetes    Nutrition management  Role of diet in the treatment of diabetes and the relationship between the three main macronutrients and blood glucose level;Reviewed blood glucose goals for pre and post meals and how to evaluate the patients' food intake on their blood glucose level.;Food label reading, portion sizes and measuring food.    Physical activity and exercise  Role of exercise on diabetes management, blood pressure control and cardiac health.;Helped patient identify appropriate exercises in relation to his/her diabetes, diabetes complications and other health issue.    Monitoring Purpose and frequency of SMBG.;Taught/discussed recording of test results and interpretation of SMBG.    Chronic complications Nephropathy, what it is, prevention of, the use of ACE, ARB's and early detection of through urine microalbumia.    Psychosocial adjustment Role of stress on diabetes   Pt is high anxiety   Personal strategies to promote health Lifestyle issues that need to be addressed for better diabetes care   Avoiding fasting, stress reduction     Individualized Goals (developed by patient)   Nutrition Follow meal plan discussed    Physical Activity Exercise 3-5 times per week    Medications Not Applicable    Monitoring  test my blood glucose as discussed;test blood glucose pre and post meals as discussed      Post-Education Assessment   Patient understands the diabetes disease and treatment process. Needs Review    Patient understands incorporating nutritional management into lifestyle. Needs Review    Patient undertands incorporating physical activity into lifestyle. Needs Review    Patient understands using medications safely. Needs Review    Patient understands monitoring blood glucose, interpreting and using results Needs  Review    Patient understands prevention, detection, and treatment of acute complications. Needs Review    Patient understands prevention, detection, and treatment of chronic complications. Needs Review    Patient understands how to develop strategies to address psychosocial issues. Needs Review    Patient understands how to develop strategies to promote health/change behavior. Needs Review      Outcomes   Expected Outcomes Demonstrated interest in learning. Expect positive outcomes    Future DMSE 4-6 wks    Program Status Not Completed           Individualized Plan for Diabetes Self-Management Training:   Learning Objective:  Patient will have a greater understanding of diabetes self-management. Patient education plan is to attend individual and/or group sessions per assessed needs and concerns.   Plan:   Patient Instructions  Continue to work on increasing your physical activity until you are to  get to 30 minutes a day, 5 days a week, without allowing more than 48 hours in between bouts of moderate intensity physical activity.  Remember the three things that can raise your blood sugar besides food: Stress, Injury, and Illness  Check your blood sugar either fasting in the morning, or 2 hours after you start eating a meal Goals:  80-110 Fasting >140 2 hrs after starting a meal  Work towards eating three meals a day, about 5-6 hours apart!  Begin to recognize carbohydrates in your food choices!  Begin to build your meals using the proportions of the Balanced Plate. . First, select your carb choice(s) for the meal. . Next, select your source of protein to pair with your carb choice(s). . Finally, complete the remaining half of your meal with a variety of non-starchy vegetables.      Expected Outcomes:  Demonstrated interest in learning. Expect positive outcomes  Education material provided: ADA - How to Thrive: A Guide for Your Journey with Diabetes  If problems or  questions, patient to contact team via:  Phone and Email  Future DSME appointment: 4-6 wks

## 2021-06-28 ENCOUNTER — Encounter: Payer: Federal, State, Local not specified - PPO | Admitting: Dietician

## 2021-06-28 ENCOUNTER — Telehealth: Payer: Federal, State, Local not specified - PPO | Admitting: Dietician

## 2021-07-06 DIAGNOSIS — R399 Unspecified symptoms and signs involving the genitourinary system: Secondary | ICD-10-CM | POA: Diagnosis not present

## 2021-07-06 DIAGNOSIS — I10 Essential (primary) hypertension: Secondary | ICD-10-CM | POA: Diagnosis not present

## 2021-07-06 DIAGNOSIS — R6884 Jaw pain: Secondary | ICD-10-CM | POA: Diagnosis not present

## 2021-07-08 ENCOUNTER — Emergency Department (HOSPITAL_BASED_OUTPATIENT_CLINIC_OR_DEPARTMENT_OTHER)
Admission: EM | Admit: 2021-07-08 | Discharge: 2021-07-09 | Disposition: A | Discharge status: Home / Self Care | Payer: Federal, State, Local not specified - PPO | Attending: Emergency Medicine | Admitting: Emergency Medicine

## 2021-07-08 ENCOUNTER — Other Ambulatory Visit: Payer: Self-pay

## 2021-07-08 ENCOUNTER — Encounter (HOSPITAL_BASED_OUTPATIENT_CLINIC_OR_DEPARTMENT_OTHER): Payer: Self-pay | Admitting: Emergency Medicine

## 2021-07-08 ENCOUNTER — Emergency Department (HOSPITAL_BASED_OUTPATIENT_CLINIC_OR_DEPARTMENT_OTHER): Payer: Federal, State, Local not specified - PPO

## 2021-07-08 DIAGNOSIS — R202 Paresthesia of skin: Secondary | ICD-10-CM | POA: Diagnosis not present

## 2021-07-08 DIAGNOSIS — Z79899 Other long term (current) drug therapy: Secondary | ICD-10-CM | POA: Insufficient documentation

## 2021-07-08 DIAGNOSIS — J4540 Moderate persistent asthma, uncomplicated: Secondary | ICD-10-CM | POA: Diagnosis not present

## 2021-07-08 DIAGNOSIS — R350 Frequency of micturition: Secondary | ICD-10-CM | POA: Diagnosis not present

## 2021-07-08 DIAGNOSIS — R000 Tachycardia, unspecified: Secondary | ICD-10-CM | POA: Insufficient documentation

## 2021-07-08 DIAGNOSIS — I10 Essential (primary) hypertension: Secondary | ICD-10-CM | POA: Diagnosis not present

## 2021-07-08 DIAGNOSIS — E119 Type 2 diabetes mellitus without complications: Secondary | ICD-10-CM | POA: Insufficient documentation

## 2021-07-08 DIAGNOSIS — R0789 Other chest pain: Secondary | ICD-10-CM | POA: Diagnosis not present

## 2021-07-08 DIAGNOSIS — M542 Cervicalgia: Secondary | ICD-10-CM | POA: Diagnosis not present

## 2021-07-08 DIAGNOSIS — R0602 Shortness of breath: Secondary | ICD-10-CM | POA: Diagnosis not present

## 2021-07-08 DIAGNOSIS — R079 Chest pain, unspecified: Secondary | ICD-10-CM | POA: Diagnosis not present

## 2021-07-08 DIAGNOSIS — R6884 Jaw pain: Secondary | ICD-10-CM | POA: Diagnosis not present

## 2021-07-08 LAB — CBC
HCT: 40.2 % (ref 36.0–46.0)
Hemoglobin: 12.7 g/dL (ref 12.0–15.0)
MCH: 25.8 pg — ABNORMAL LOW (ref 26.0–34.0)
MCHC: 31.6 g/dL (ref 30.0–36.0)
MCV: 81.7 fL (ref 80.0–100.0)
Platelets: 344 10*3/uL (ref 150–400)
RBC: 4.92 MIL/uL (ref 3.87–5.11)
RDW: 13.6 % (ref 11.5–15.5)
WBC: 7.4 10*3/uL (ref 4.0–10.5)
nRBC: 0 % (ref 0.0–0.2)

## 2021-07-08 LAB — BASIC METABOLIC PANEL
Anion gap: 12 (ref 5–15)
BUN: 11 mg/dL (ref 6–20)
CO2: 21 mmol/L — ABNORMAL LOW (ref 22–32)
Calcium: 9.2 mg/dL (ref 8.9–10.3)
Chloride: 104 mmol/L (ref 98–111)
Creatinine, Ser: 0.65 mg/dL (ref 0.44–1.00)
GFR, Estimated: >60 mL/min (ref >60)
Glucose, Bld: 96 mg/dL (ref 70–99)
Potassium: 3.6 mmol/L (ref 3.5–5.1)
Sodium: 137 mmol/L (ref 135–145)

## 2021-07-08 LAB — PREGNANCY, URINE: Preg Test, Ur: NEGATIVE

## 2021-07-08 LAB — TROPONIN I (HIGH SENSITIVITY): Troponin I (High Sensitivity): 5 ng/L (ref <18)

## 2021-07-08 NOTE — ED Triage Notes (Signed)
Reports left arm numbness and pinky numbness for several days that comes and goes.  Reports right sided chest pain that started today describes as sore.  Denies n/v/d.  Does endorse a little sob.  Also reports pain in the right side of her neck is painful.  Seen at [facility].  Did not see anything.

## 2021-07-08 NOTE — ED Notes (Addendum)
Ed provider (PA) at bedside

## 2021-07-09 LAB — TROPONIN I (HIGH SENSITIVITY): Troponin I (High Sensitivity): 6 ng/L (ref <18)

## 2021-07-09 NOTE — ED Notes (Signed)
See EDP assessment 

## 2021-07-09 NOTE — Discharge Instructions (Addendum)
Continue your medications as prescribed. Do not take the Lisinopril for blood pressure. As we discussed, measure your blood pressure once in the morning, once in the afternoon and once in the evening and keep a journal of the readings. This can be reviewed with your doctor.   Return to the ED with any new or concerning symptoms.

## 2021-07-09 NOTE — ED Provider Notes (Signed)
[facility] Provider Note   CSN: [id number] Arrival date & time: 07/08/21  2122     History Chief Complaint  Patient presents with   Chest Pain   left arm numbness    Savannah Henry is a 40 y.o. female.  Patient to ED with symptoms of right submental discomfort, present for the last several days. She reports recent upper cavity repair on the right that has not been problematic. She also noted symptoms of left arm numbness that extended to her 5th  Today she started having pain/discomfort in the right chest and mild SOB. No cough, fever, congestion, nausea. No aggravating or alleviating factors. No history of similar symptoms. Seen by Urgent Care yesterday and started on Augmentin for suspected UTI, reporting urinary frequency, and to cover any dental infection. History of HLD, T2DM recently diagnosed.   The history is provided by the patient. No language interpreter was used.  Chest Pain Associated symptoms: numbness and shortness of breath   Associated symptoms: no abdominal pain, no cough, no fever and no nausea       Past Medical History:  Diagnosis Date   Anxiety    Asthma    no meds needed, maybe 1-2 times per year   Chicken pox    Dysrhythmia    palpitations with pregnancy   GERD (gastroesophageal reflux disease)    no meds   Hyperlipidemia    Obesity    PONV (postoperative nausea and vomiting)    Vaginal Pap smear, abnormal 2000    Patient Active Problem List   Diagnosis Date Noted   Hypertension 05/19/2021   Bronchitis 05/05/2021   Uncontrolled moderate persistent asthma 05/05/2021   Abscess of groin, right 05/05/2021   Controlled type 2 diabetes mellitus without complication, without long-term current use of insulin ([place]) 07/07/2017   Depression with anxiety 10/09/2012   Eczema 08/11/2012   Obesity (BMI 35.0-39.9 without comorbidity) 08/11/2012    Past Surgical History:  Procedure Laterality Date   CESAREAN SECTION   2005   CESAREAN SECTION N/A 08/06/2013   Procedure: REPEAT CESAREAN SECTION;  Surgeon: Shelly Bombard, MD;  Location: [facility];  Service: Obstetrics;  Laterality: N/A;   CESAREAN SECTION WITH BILATERAL TUBAL LIGATION N/A 12/31/2017   Procedure: CESAREAN SECTION WITH BILATERAL TUBAL LIGATION;  Surgeon: Truett Mainland, DO;  Location: [facility];  Service: Obstetrics;  Laterality: N/A;   CHOLECYSTECTOMY  2007   DILATION AND CURETTAGE OF UTERUS  12/18/2015   pt reported miscarriage   EXCISION METACARPAL MASS Right 09/21/2013   Procedure: RIGHT PALM EXCISION MASS;  Surgeon: Tennis Must, MD;  Location: [facility];  Service: Orthopedics;  Laterality: Right;     OB History     Gravida  5   Para  3   Term  2   Preterm      AB  2   Living  2      SAB  2   IAB      Ectopic      Multiple      Live Births  2           Family History  Problem Relation Age of Onset   Arthritis Mother    Breast cancer Mother 71   Hypertension Mother    ALS Father    Breast cancer Maternal Aunt    Lung cancer Maternal Uncle    Diabetes Paternal Aunt    Arthritis Maternal Grandmother  Breast cancer Maternal Grandmother    Heart disease Paternal Grandmother    Hypertension Paternal Grandmother    Diabetes Paternal Grandfather     Social History   Tobacco Use   Smoking status: Never   Smokeless tobacco: Never  Substance Use Topics   Alcohol use: No   Drug use: No    Home Medications Prior to Admission medications   Medication Sig Start Date End Date Taking? Authorizing Provider  Accu-Chek Softclix Lancets lancets check glucose once daily Patient taking differently: check glucose once daily 05/12/21   Debbrah Alar, NP  albuterol (VENTOLIN HFA) 108 (90 Base) MCG/ACT inhaler Inhale 2 puffs into the lungs every 6 (six) hours as needed for wheezing or shortness of breath. 07/08/19 10/06/19  Debbrah Alar, NP  amLODipine (NORVASC) 5 MG tablet Take  1 tablet (5 mg total) by mouth daily. 05/19/21   Debbrah Alar, NP  betamethasone valerate ointment (VALISONE) 0.1 % Apply 1 application topically 2 (two) times daily. 05/05/21   Debbrah Alar, NP  blood glucose meter kit and supplies KIT Dispense based on patient and insurance preference. Use up to four times daily as directed. 05/08/21   Debbrah Alar, NP  glucose blood (ACCU-CHEK GUIDE) test strip check glucose one time daily Patient taking differently: check glucose one time daily 05/12/21   Debbrah Alar, NP    Allergies    Other  Review of Systems   Review of Systems  Constitutional:  Negative for chills and fever.  HENT: Negative.    Respiratory:  Positive for shortness of breath. Negative for cough.   Cardiovascular:  Positive for chest pain.  Gastrointestinal: Negative.  Negative for abdominal pain and nausea.  Musculoskeletal: Negative.   Skin: Negative.   Neurological:  Positive for numbness.   Physical Exam Updated Vital Signs BP 135/89   Pulse 80   Temp 99.2 F (37.3 C) (Oral)   Resp 17   Ht $R'5\' 3"'yx$  (1.6 m)   Wt 87.5 kg   LMP 07/07/2021   SpO2 99%   BMI 34.17 kg/m   Physical Exam Vitals and nursing note reviewed.  Constitutional:      Appearance: She is well-developed.  HENT:     Head: Normocephalic.  Cardiovascular:     Rate and Rhythm: Normal rate and regular rhythm.     Heart sounds: No murmur heard. Pulmonary:     Effort: Pulmonary effort is normal.     Breath sounds: Normal breath sounds. No wheezing, rhonchi or rales.  Abdominal:     General: Bowel sounds are normal.     Palpations: Abdomen is soft.     Tenderness: There is no abdominal tenderness. There is no guarding or rebound.  Musculoskeletal:        General: Normal range of motion.     Cervical back: Normal range of motion and neck supple.  Lymphadenopathy:     Head:     Right side of head: No submental or submandibular adenopathy.     Left side of head: No submental  or submandibular adenopathy.     Cervical:     Right cervical: No superficial cervical adenopathy.    Left cervical: No superficial cervical adenopathy.  Skin:    General: Skin is warm and dry.  Neurological:     General: No focal deficit present.     Mental Status: She is alert and oriented to person, place, and time.    ED Results / Procedures / Treatments   Labs (all labs  ordered are listed, but only abnormal results are displayed) Labs Reviewed  BASIC METABOLIC PANEL - Abnormal; Notable for the following components:      Result Value   CO2 21 (*)    All other components within normal limits  CBC - Abnormal; Notable for the following components:   MCH 25.8 (*)    All other components within normal limits  PREGNANCY, URINE  TROPONIN I (HIGH SENSITIVITY)  TROPONIN I (HIGH SENSITIVITY)    EKG EKG Interpretation  Date/Time:  Saturday July 08 2021 21:29:37 EDT Ventricular Rate:  108 PR Interval:  138 QRS Duration: 74 QT Interval:  348 QTC Calculation: 466 R Axis:   48 Text Interpretation: Sinus tachycardia Otherwise normal ECG No old tracing to compare Confirmed by Isla Pence [phone]) on 07/08/2021 9:47:31 PM  Radiology DG Chest 2 View  Result Date: 07/08/2021 CLINICAL DATA:  40 year old female with chest pain. EXAM: CHEST - 2 VIEW COMPARISON:  Chest radiograph dated 10/10/2015. FINDINGS: The heart size and mediastinal contours are within normal limits. Both lungs are clear. The visualized skeletal structures are unremarkable. IMPRESSION: No active cardiopulmonary disease. Electronically Signed   By: Anner Crete M.D.   On: 07/08/2021 22:26    Procedures Procedures   Medications Ordered in ED Medications - No data to display  ED Course  I have reviewed the triage vital signs and the nursing notes.  Pertinent labs & imaging results that were available during my care of the patient were reviewed by me and considered in my medical decision making (see chart for  details).    MDM Rules/Calculators/A&P                           Patient to ED with ss/sxs as per HPI.   Very well appearing, in NAD. VSS.   CXR clear - no infection, CM, edema. EKG without acute change. Labs, including delta troponin, normal. Doubt PNA, PTX, PE, dissection, ACS. Symptoms do not represent a concerning pattern.   She is felt appropriate for discharge home. Return precautions discussed.   Final Clinical Impression(s) / ED Diagnoses Final diagnoses:  None   Nonspecific chest pain Neck pain  Rx / DC Orders ED Discharge Orders     None        Dennie Bible 07/09/21 0033    Isla Pence, MD 07/09/21 [phone]

## 2021-07-28 ENCOUNTER — Other Ambulatory Visit: Payer: Self-pay | Admitting: Family

## 2021-08-02 DIAGNOSIS — E119 Type 2 diabetes mellitus without complications: Secondary | ICD-10-CM | POA: Diagnosis not present

## 2021-08-02 LAB — HM DIABETES EYE EXAM

## 2021-08-14 ENCOUNTER — Other Ambulatory Visit: Payer: Self-pay | Admitting: Family

## 2021-08-19 ENCOUNTER — Other Ambulatory Visit: Payer: Self-pay | Admitting: Family

## 2022-05-23 ENCOUNTER — Other Ambulatory Visit (HOSPITAL_BASED_OUTPATIENT_CLINIC_OR_DEPARTMENT_OTHER): Payer: Self-pay

## 2022-05-23 ENCOUNTER — Ambulatory Visit (INDEPENDENT_AMBULATORY_CARE_PROVIDER_SITE_OTHER): Payer: Federal, State, Local not specified - PPO | Admitting: Family

## 2022-05-23 ENCOUNTER — Ambulatory Visit (HOSPITAL_BASED_OUTPATIENT_CLINIC_OR_DEPARTMENT_OTHER)
Admission: RE | Admit: 2022-05-23 | Discharge: 2022-05-23 | Disposition: A | Discharge status: Home / Self Care | Payer: Federal, State, Local not specified - PPO | Source: Ambulatory Visit | Attending: Family | Admitting: Family

## 2022-05-23 ENCOUNTER — Telehealth: Payer: Self-pay | Admitting: Family

## 2022-05-23 ENCOUNTER — Other Ambulatory Visit: Payer: Self-pay

## 2022-05-23 ENCOUNTER — Encounter (HOSPITAL_BASED_OUTPATIENT_CLINIC_OR_DEPARTMENT_OTHER): Payer: Self-pay

## 2022-05-23 VITALS — BP 150/105 | HR 110 | Temp 98.5°F | Resp 16 | Ht 63.0 in | Wt 164.0 lb

## 2022-05-23 DIAGNOSIS — F418 Other specified anxiety disorders: Secondary | ICD-10-CM | POA: Diagnosis not present

## 2022-05-23 DIAGNOSIS — E119 Type 2 diabetes mellitus without complications: Secondary | ICD-10-CM

## 2022-05-23 DIAGNOSIS — Z1231 Encounter for screening mammogram for malignant neoplasm of breast: Secondary | ICD-10-CM

## 2022-05-23 DIAGNOSIS — J302 Other seasonal allergic rhinitis: Secondary | ICD-10-CM

## 2022-05-23 DIAGNOSIS — I10 Essential (primary) hypertension: Secondary | ICD-10-CM

## 2022-05-23 LAB — LIPID PANEL
Cholesterol: 185 mg/dL (ref 0–200)
HDL: 43.9 mg/dL (ref >39.00)
LDL Cholesterol: 133 mg/dL — ABNORMAL HIGH (ref 0–99)
NonHDL: 141.28
Total CHOL/HDL Ratio: 4
Triglycerides: 43 mg/dL (ref 0.0–149.0)
VLDL: 8.6 mg/dL (ref 0.0–40.0)

## 2022-05-23 LAB — COMPREHENSIVE METABOLIC PANEL
ALT: 17 U/L (ref 0–35)
AST: 14 U/L (ref 0–37)
Albumin: 4 g/dL (ref 3.5–5.2)
Alkaline Phosphatase: 79 U/L (ref 39–117)
BUN: 9 mg/dL (ref 6–23)
CO2: 24 mEq/L (ref 19–32)
Calcium: 9.1 mg/dL (ref 8.4–10.5)
Chloride: 105 mEq/L (ref 96–112)
Creatinine, Ser: 0.6 mg/dL (ref 0.40–1.20)
GFR: 111.6 mL/min (ref >60.00)
Glucose, Bld: 92 mg/dL (ref 70–99)
Potassium: 3.8 mEq/L (ref 3.5–5.1)
Sodium: 138 mEq/L (ref 135–145)
Total Bilirubin: 0.4 mg/dL (ref 0.2–1.2)
Total Protein: 6.8 g/dL (ref 6.0–8.3)

## 2022-05-23 LAB — MICROALBUMIN / CREATININE URINE RATIO
Creatinine,U: 53.4 mg/dL
Microalb Creat Ratio: 1.3 mg/g (ref 0.0–30.0)
Microalb, Ur: <0.7 mg/dL (ref 0.0–1.9)

## 2022-05-23 LAB — HEMOGLOBIN A1C: Hgb A1c MFr Bld: 5.7 % (ref 4.6–6.5)

## 2022-05-23 MED ORDER — AMLODIPINE BESYLATE 5 MG PO TABS
5.0000 mg | ORAL_TABLET | Freq: Every day | ORAL | 1 refills | Status: AC
  Filled 2022-05-23: qty 90, 90d supply, fill #0
  Filled 2022-08-17 – 2022-08-20 (×2): qty 90, 90d supply, fill #1

## 2022-05-23 MED ORDER — LORATADINE 10 MG PO TABS: 10.0000 mg | ORAL_TABLET | Freq: Every day | ORAL | 11 refills | Status: DC

## 2022-05-23 NOTE — Telephone Encounter (Signed)
Pt stated she would like the lisinopril sent to the pharmacy downstairs.

## 2022-05-23 NOTE — Assessment & Plan Note (Signed)
Likely contributing to cough. Recommended that she add claritin once daily.

## 2022-05-23 NOTE — Assessment & Plan Note (Signed)
Depression appears stable, but she has a lot of anxiety surrounding her health. Will continue to monitor.

## 2022-05-23 NOTE — Telephone Encounter (Signed)
Records release faxed 

## 2022-05-23 NOTE — Telephone Encounter (Signed)
Rx sent but it was not for Lisinopril, it was for Amlodipine. Patient advised

## 2022-05-23 NOTE — Progress Notes (Signed)
Subjective:     Patient ID: Savannah Henry, female    DOB: [birth year], 41 y.o.   MRN: [id number]  Chief Complaint  Patient presents with   Diabetes    Here for follow up   Hypertension    Here for follow up, not taking Amlodipine 5 mg    HPI Patient is in today for follow up.  HTN- She states that she only took amlodipine 93m one time.   She is not checking at home.   BP Readings from Last 3 Encounters:  05/23/22 (!) 150/105  07/09/21 124/79  05/19/21 (!) 146/90   Reports that she had a bad panic attack last year.   She has drastically changed her diet.    Wt Readings from Last 3 Encounters:  05/23/22 164 lb (74.4 kg)  07/08/21 192 lb 14.4 oz (87.5 kg)  05/23/21 207 lb 12.8 oz (94.3 kg)   Notes some increased cough. Attributes to cough.  Mild congestion.    Lab Results  Component Value Date   HGBA1C 6.5 05/05/2021   HGBA1C 5.7 (H) 07/03/2017   Lab Results  Component Value Date   MICROALBUR 1.0 05/19/2021   LDLCALC 144 (H) 05/05/2021   CREATININE 0.65 07/08/2021    Health Maintenance Due  Topic Date Due   OPHTHALMOLOGY EXAM  Never done   Hepatitis C Screening  Never done   COVID-19 Vaccine (4 - Booster for [place]) 02/13/2021   HEMOGLOBIN A1C  11/05/2021   FOOT EXAM  05/12/2022   URINE MICROALBUMIN  05/19/2022    Past Medical History:  Diagnosis Date   Anxiety    Asthma    no meds needed, maybe 1-2 times per year   Chicken pox    Dysrhythmia    palpitations with pregnancy   GERD (gastroesophageal reflux disease)    no meds   Hyperlipidemia    Obesity    PONV (postoperative nausea and vomiting)    Vaginal Pap smear, abnormal 2000    Past Surgical History:  Procedure Laterality Date   CESAREAN SECTION  2005   CESAREAN SECTION N/A 08/06/2013   Procedure: REPEAT CESAREAN SECTION;  Surgeon: CShelly Bombard MD;  [facility];  Service: Obstetrics;  Laterality: N/A;   CESAREAN SECTION WITH BILATERAL TUBAL LIGATION N/A 12/31/2017    Procedure: CESAREAN SECTION WITH BILATERAL TUBAL LIGATION;  Surgeon: STruett Mainland DO;  [place] [facility]  Service: Obstetrics;  Laterality: N/A;   CHOLECYSTECTOMY  2007   DILATION AND CURETTAGE OF UTERUS  12/18/2015   pt reported miscarriage   EXCISION METACARPAL MASS Right 09/21/2013   Procedure: RIGHT PALM EXCISION MASS;  Surgeon: KTennis Must MD;  [facility]  Service: Orthopedics;  Laterality: Right;    Family History  Problem Relation Age of Onset   Arthritis Mother    Breast cancer Mother [phone]  Hypertension Mother    ALS Father    Breast cancer Maternal Aunt    Lung cancer Maternal Uncle    Diabetes Paternal Aunt    Arthritis Maternal Grandmother    Breast cancer Maternal Grandmother    Heart disease Paternal Grandmother    Hypertension Paternal Grandmother    Diabetes Paternal Grandfather     Social History   Socioeconomic History   Marital status: Married    Spouse name: Not on file   Number of children: 2   Years of education: Not on file   Highest education level: Not on  file  Occupational History   Occupation: Freight forwarder  Tobacco Use   Smoking status: Never   Smokeless tobacco: Never  Substance and Sexual Activity   Alcohol use: No   Drug use: No   Sexual activity: Yes    Partners: Male    Birth control/protection: None  Other Topics Concern   Not on file  Social History Narrative   Works for [employer]   Daughter- 2005   Daughter-2014   Engaged   Enjoys spending itme with her family, friends   Social Determinants of Radio broadcast assistant Strain: Not on file  Food Insecurity: Not on file  Transportation Needs: Not on file  Physical Activity: Not on file  Stress: Not on file  Social Connections: Not on file  Intimate Partner Violence: Not on file    Outpatient Medications Prior to Visit  Medication Sig Dispense Refill   Accu-Chek Softclix Lancets lancets check glucose once daily (Patient  taking differently: check glucose once daily) 100 each 3   Blood Glucose Monitoring Suppl (ACCU-CHEK GUIDE) w/Device KIT AS DIRECTED 1 kit 0   glucose blood (ACCU-CHEK GUIDE) test strip check glucose one time daily 100 strip 3   albuterol (VENTOLIN HFA) 108 (90 Base) MCG/ACT inhaler Inhale 2 puffs into the lungs every 6 (six) hours as needed for wheezing or shortness of breath. 25.5 g 1   amLODipine (NORVASC) 5 MG tablet TAKE 1 TABLET(5 MG) BY MOUTH DAILY (Patient not taking: Reported on 05/23/2022) 30 tablet 0   betamethasone valerate ointment (VALISONE) 0.1 % Apply 1 application topically 2 (two) times daily. 30 g 1   No facility-administered medications prior to visit.    Allergies  Allergen Reactions   Other Nausea And Vomiting and Other (See Comments)    Anesthesia     ROS See HPI    Objective:    Physical Exam Constitutional:      General: She is not in acute distress.    Appearance: Normal appearance. She is well-developed.  HENT:     Head: Normocephalic and atraumatic.     Right Ear: External ear normal.     Left Ear: External ear normal.  Eyes:     General: No scleral icterus. Neck:     Thyroid: No thyromegaly.  Cardiovascular:     Rate and Rhythm: Normal rate and regular rhythm.     Heart sounds: Normal heart sounds. No murmur heard. Pulmonary:     Effort: Pulmonary effort is normal. No respiratory distress.     Breath sounds: Normal breath sounds. No wheezing.  Musculoskeletal:     Cervical back: Neck supple.  Skin:    General: Skin is warm and dry.  Neurological:     Mental Status: She is alert and oriented to person, place, and time.  Psychiatric:        Mood and Affect: Mood is anxious. Affect is tearful (when discussing her health).        Behavior: Behavior normal.        Thought Content: Thought content normal.        Judgment: Judgment normal.    BP (!) 150/105   Pulse (!) 110   Temp 98.5 F (36.9 C) (Oral)   Resp 16   Ht _0  (1.6 m)   Wt  164 lb (74.4 kg)   SpO2 100%   BMI 29.05 kg/m  Wt Readings from Last 3 Encounters:  05/23/22 164 lb (74.4 kg)  07/08/21 192 lb 14.4 oz (  87.5 kg)  05/23/21 207 lb 12.8 oz (94.3 kg)       Assessment & Plan:   Problem List Items Addressed This Visit       Unprioritized   Seasonal allergies    Likely contributing to cough. Recommended that she add claritin once daily.        Hypertension    BP Readings from Last 3 Encounters:  05/23/22 (!) 150/105  07/09/21 124/79  05/19/21 (!) 146/90  BP is uncontrolled. Will start amlodipine 65m once daily.       Depression with anxiety    Depression appears stable, but she has a lot of anxiety surrounding her health. Will continue to monitor.        Controlled type 2 diabetes mellitus without complication, without long-term current use of insulin ([facility] [facility]    She has lost a tremendous amount of weight. Expect A1C to be improved.         Relevant Orders   Hemoglobin A1c   Comp Met (CMET)   Lipid panel   Urine Microalbumin w/creat. ratio   Other Visit Diagnoses     Encounter for screening mammogram for malignant neoplasm of breast       Relevant Orders   MM 3D SCREEN BREAST BILATERAL      33 minutes spent on today's visit. The majority of this time was spent counseling pt on diabetes and hypertension.  I have discontinued Kellsey B. Pantaleon's betamethasone valerate ointment. I am also having her start on loratadine. Additionally, I am having her maintain her albuterol, Accu-Chek Softclix Lancets, Accu-Chek Guide, amLODipine, and Accu-Chek Guide.  Meds ordered this encounter  Medications   loratadine (CLARITIN) 10 MG tablet    Sig: Take 1 tablet (10 mg total) by mouth daily.    Dispense:  30 tablet    Refill:  11    Order Specific Question:   Supervising Provider    Answer:   BPenni HomansA [[id number]]

## 2022-05-23 NOTE — Assessment & Plan Note (Signed)
She has lost a tremendous amount of weight. Expect A1C to be improved.

## 2022-05-23 NOTE — Assessment & Plan Note (Signed)
BP Readings from Last 3 Encounters:  05/23/22 (!) 150/105  07/09/21 124/79  05/19/21 (!) 146/90   BP is uncontrolled. Will start amlodipine '5mg'$  once daily.

## 2022-05-23 NOTE — Telephone Encounter (Signed)
Please call [facility] in [place] to request DM eye exam.

## 2022-06-10 DIAGNOSIS — B9689 Other specified bacterial agents as the cause of diseases classified elsewhere: Secondary | ICD-10-CM | POA: Diagnosis not present

## 2022-06-10 DIAGNOSIS — J208 Acute bronchitis due to other specified organisms: Secondary | ICD-10-CM | POA: Diagnosis not present

## 2022-07-13 ENCOUNTER — Ambulatory Visit: Payer: Federal, State, Local not specified - PPO | Admitting: Family

## 2022-07-13 VITALS — BP 134/88 | HR 104 | Temp 99.1°F | Resp 16 | Wt 165.0 lb

## 2022-07-13 DIAGNOSIS — E119 Type 2 diabetes mellitus without complications: Secondary | ICD-10-CM | POA: Diagnosis not present

## 2022-07-13 DIAGNOSIS — I10 Essential (primary) hypertension: Secondary | ICD-10-CM

## 2022-07-13 DIAGNOSIS — R053 Chronic cough: Secondary | ICD-10-CM

## 2022-07-13 DIAGNOSIS — F419 Anxiety disorder, unspecified: Secondary | ICD-10-CM | POA: Diagnosis not present

## 2022-07-13 MED ORDER — OMEPRAZOLE 40 MG PO CPDR: 40.0000 mg | DELAYED_RELEASE_CAPSULE | Freq: Every day | ORAL | 3 refills | Status: DC

## 2022-07-13 NOTE — Patient Instructions (Signed)
Please begin omeprazole '40mg'$  once daily for cough. You should be contacted about scheduling your appointment with Pulmonology to further discuss your cough. Here is the number for [place]  [phone]. Please consider calling to establish with a counselor to discuss your anxiety. Keep up the good work with healthy diet and weight management!

## 2022-07-13 NOTE — Assessment & Plan Note (Addendum)
Lab Results  Component Value Date   HGBA1C 5.7 05/23/2022   Follow up A1C much better following weight loss. I commended pt on her hard work. I addressed her question regarding A1C being in the "green" on mychart and her still having diagnosis of diabetes.  I looked at her mychart view and her A1C of 6.5 was listed as in the green as it was at goal for diabetes (not that it is normal).

## 2022-07-13 NOTE — Assessment & Plan Note (Addendum)
BP Readings from Last 3 Encounters:  07/13/22 134/88  05/23/22 (!) 150/105  07/09/21 124/79   Much improved on amlodipine '5mg'$ .  Now at goal. Continue same.

## 2022-07-13 NOTE — Assessment & Plan Note (Addendum)
Unchanged. Normal lung exam today. No significant improvement with addition of antihistamine or use of albuterol. Reports no improvement in the past with flovent either. Will add trial of omeprazole and refer to pulmonology for further evaluation.

## 2022-07-13 NOTE — Progress Notes (Signed)
Subjective:   By signing my name below, I, Carylon Perches, attest that this documentation has been prepared under the direction and in the presence of Karie Chimera, NP 07/13/2022   Patient ID: Savannah Henry, female    DOB: 1981/03/29, 41 y.o.   MRN: [id number]  Chief Complaint  Patient presents with   Cough    Patient complains of persistent cough    HPI Patient is in today for an office visit. She is accompanied by her husband.  Cough: She reports that her cough is persistent. She was previously recommended to take allergy medications but symptoms did not improve. She then went to an urgent care with associated symptoms of rattling and was diagnosed with bronchitis and prescribed antibiotics and steroids. She believes the antibiotics helped some but her symptoms are still persistent. She also notes that she occasionally has SOB. Coughing can become so bad to the point where it affects her work life. As of today's visit, her rattling has been resolved. She is interested in being referred to a pulmonary specialist. She is also interested in having her ears cleaned.  Diabetes: She is expressing concern about her diabetic results. She states that when she looked at her charts and original testing,  her results were in the "green" but was still diagnosed with diabetes. Since then, she states that her blood pressure has been changing due to anxiety. She is also altering her diet. She is now controlled.  Lab Results  Component Value Date   HGBA1C 5.7 05/23/2022   Anxiety: She reports that she is experiencing anxiety when in the doctor's office. She also experiences anxiety about taking medications. She was feeling fine prior to getting her diabetes diagnosis however, since then, she experiences anxiety about going to a primary care doctor. She denies any issues with anxiety in terms of non-medical related experiences. Blood Pressure: She believes that her anxiety might be contributing to  her elevating blood pressure. She is currently taking 5 Mg of Amlodipine and takes the medication during the evenings.   BP Readings from Last 3 Encounters:  07/13/22 134/88  05/23/22 (!) 150/105  07/09/21 124/79   Pulse Readings from Last 3 Encounters:  07/13/22 (!) 104  05/23/22 (!) 110  07/09/21 65    Health Maintenance Due  Topic Date Due   Hepatitis C Screening  Never done   COVID-19 Vaccine (4 - Pfizer series) 02/13/2021    Past Medical History:  Diagnosis Date   Anxiety    Asthma    no meds needed, maybe 1-2 times per year   Chicken pox    Dysrhythmia    palpitations with pregnancy   GERD (gastroesophageal reflux disease)    no meds   Hyperlipidemia    Obesity    PONV (postoperative nausea and vomiting)    Vaginal Pap smear, abnormal 2000    Past Surgical History:  Procedure Laterality Date   CESAREAN SECTION  2005   CESAREAN SECTION N/A 08/06/2013   Procedure: REPEAT CESAREAN SECTION;  Surgeon: Shelly Bombard, MD;  Location: [facility];  Service: Obstetrics;  Laterality: N/A;   CESAREAN SECTION WITH BILATERAL TUBAL LIGATION N/A 12/31/2017   Procedure: CESAREAN SECTION WITH BILATERAL TUBAL LIGATION;  Surgeon: Truett Mainland, DO;  Location: [place];  Service: Obstetrics;  Laterality: N/A;   CHOLECYSTECTOMY  2007   DILATION AND CURETTAGE OF UTERUS  12/18/2015   pt reported miscarriage   EXCISION METACARPAL MASS Right 09/21/2013   Procedure:  RIGHT PALM EXCISION MASS;  Surgeon: Tennis Must, MD;  Location: [place];  Service: Orthopedics;  Laterality: Right;    Family History  Problem Relation Age of Onset   Arthritis Mother    Breast cancer Mother 68   Hypertension Mother    ALS Father    Breast cancer Maternal Aunt    Lung cancer Maternal Uncle    Diabetes Paternal Aunt    Arthritis Maternal Grandmother    Breast cancer Maternal Grandmother    Heart disease Paternal Grandmother    Hypertension Paternal Grandmother    Diabetes  Paternal Grandfather     Social History   Socioeconomic History   Marital status: Married    Spouse name: Not on file   Number of children: 2   Years of education: Not on file   Highest education level: Not on file  Occupational History   Occupation: Freight forwarder  Tobacco Use   Smoking status: Never   Smokeless tobacco: Never  Substance and Sexual Activity   Alcohol use: No   Drug use: No   Sexual activity: Yes    Partners: Male    Birth control/protection: None  Other Topics Concern   Not on file  Social History Narrative   Works for [employer]   Daughter- 2005   Daughter-2014   Engaged   Enjoys spending itme with her family, friends   Social Determinants of Radio broadcast assistant Strain: Not on file  Food Insecurity: Not on file  Transportation Needs: Not on file  Physical Activity: Not on file  Stress: Not on file  Social Connections: Not on file  Intimate Partner Violence: Not on file    Outpatient Medications Prior to Visit  Medication Sig Dispense Refill   Accu-Chek Softclix Lancets lancets check glucose once daily (Patient taking differently: check glucose once daily) 100 each 3   amLODipine (NORVASC) 5 MG tablet Take 1 tablet (5 mg total) by mouth daily. 90 tablet 1   Blood Glucose Monitoring Suppl (ACCU-CHEK GUIDE) w/Device KIT AS DIRECTED 1 kit 0   glucose blood (ACCU-CHEK GUIDE) test strip check glucose one time daily 100 strip 3   loratadine (CLARITIN) 10 MG tablet Take 1 tablet (10 mg total) by mouth daily. 30 tablet 11   albuterol (VENTOLIN HFA) 108 (90 Base) MCG/ACT inhaler Inhale 2 puffs into the lungs every 6 (six) hours as needed for wheezing or shortness of breath. 25.5 g 1   No facility-administered medications prior to visit.    Allergies  Allergen Reactions   Other Nausea And Vomiting and Other (See Comments)    Anesthesia     Review of Systems  Respiratory:  Positive for cough and shortness of breath.        Objective:     Physical Exam Constitutional:      General: She is not in acute distress.    Appearance: Normal appearance. She is not ill-appearing.  HENT:     Head: Normocephalic and atraumatic.     Right Ear: Tympanic membrane, ear canal and external ear normal. There is no impacted cerumen.     Left Ear: Tympanic membrane, ear canal and external ear normal. There is no impacted cerumen.     Ears:     Comments: No significant ceruminosis Eyes:     Extraocular Movements: Extraocular movements intact.     Pupils: Pupils are equal, round, and reactive to light.  Cardiovascular:     Rate and Rhythm: Normal  rate and regular rhythm.     Heart sounds: Normal heart sounds. No murmur heard.    No gallop.  Pulmonary:     Effort: Pulmonary effort is normal. No respiratory distress.     Breath sounds: Normal breath sounds. No wheezing or rales.  Skin:    General: Skin is warm and dry.  Neurological:     Mental Status: She is alert and oriented to person, place, and time.  Psychiatric:        Attention and Perception: Attention normal.        Mood and Affect: Mood is anxious.        Behavior: Behavior normal.        Thought Content: Thought content normal.        Judgment: Judgment normal.     BP 134/88 (BP Location: Right Arm, Patient Position: Sitting, Cuff Size: Small)   Pulse (!) 104   Temp 99.1 F (37.3 C) (Oral)   Resp 16   Wt 165 lb (74.8 kg)   SpO2 100%   BMI 29.23 kg/m  Wt Readings from Last 3 Encounters:  07/13/22 165 lb (74.8 kg)  05/23/22 164 lb (74.4 kg)  07/08/21 192 lb 14.4 oz (87.5 kg)   Diabetic Foot Exam - Simple   Simple Foot Form Diabetic Foot exam was performed with the following findings: Yes 07/13/2022  3:46 PM  Visual Inspection No deformities, no ulcerations, no other skin breakdown bilaterally: Yes Sensation Testing Intact to touch and monofilament testing bilaterally: Yes Pulse Check Posterior Tibialis and Dorsalis pulse intact bilaterally: Yes Comments         Assessment & Plan:   Problem List Items Addressed This Visit       Unprioritized   Hypertension    BP Readings from Last 3 Encounters:  07/13/22 134/88  05/23/22 (!) 150/105  07/09/21 124/79  Much improved on amlodipine 51m.  Now at goal. Continue same.       Controlled type 2 diabetes mellitus without complication, without long-term current use of insulin ([facility]    Lab Results  Component Value Date   HGBA1C 5.7 05/23/2022  Follow up A1C much better following weight loss. I commended pt on her hard work. I addressed her question regarding A1C being in the "green" on mychart and her still having diagnosis of diabetes.  I looked at her mychart view and her A1C of 6.5 was listed as in the green as it was at goal for diabetes (not that it is normal).       Chronic cough - Primary    Unchanged. Normal lung exam today. No significant improvement with addition of antihistamine or use of albuterol. Reports no improvement in the past with flovent either. Will add trial of omeprazole and refer to pulmonology for further evaluation.       Relevant Orders   Ambulatory referral to Pulmonology   Anxiety    Pt has very significant anxiety surrounding her health and medical diagnoses.  I commended her on her work with healthy diet and weight loss and tried to reassure her that her blood pressure and sugar are well controlled at this time.  We have previously discussed counseling and I again suggested she look into establishing with a counselor.  We also discussed the possibility of treatment with a medication but she does not wish to take medication for her anxiety.       60 minutes spent on today's visit. Time was spent counseling patient about her  medical issues, anxiety and reviewing her medical record.   Meds ordered this encounter  Medications   omeprazole (PRILOSEC) 40 MG capsule    Sig: Take 1 capsule (40 mg total) by mouth daily.    Dispense:  30 capsule    Refill:  3     Order Specific Question:   Supervising Provider    Answer:   Penni Homans A [[id number]]    I, Nance Pear, NP, personally preformed the services described in this documentation.  All medical record entries made by the scribe were at my direction and in my presence.  I have reviewed the chart and discharge instructions (if applicable) and agree that the record reflects my personal performance and is accurate and complete. 07/13/2022   I,Amber Collins,acting as a Education administrator for Nance Pear, NP.,have documented all relevant documentation on the behalf of Nance Pear, NP,as directed by  Nance Pear, NP while in the presence of Nance Pear, NP.    Nance Pear, NP

## 2022-07-13 NOTE — Assessment & Plan Note (Addendum)
Pt has very significant anxiety surrounding her health and medical diagnoses.  I commended her on her work with healthy diet and weight loss and tried to reassure her that her blood pressure and sugar are well controlled at this time.  We have previously discussed counseling and I again suggested she look into establishing with a counselor.  We also discussed the possibility of treatment with a medication but she does not wish to take medication for her anxiety.

## 2022-08-17 ENCOUNTER — Other Ambulatory Visit (HOSPITAL_COMMUNITY): Payer: Self-pay

## 2022-08-18 ENCOUNTER — Other Ambulatory Visit (HOSPITAL_COMMUNITY): Payer: Self-pay

## 2022-08-18 ENCOUNTER — Other Ambulatory Visit: Payer: Self-pay | Admitting: Family

## 2022-08-19 MED ORDER — ACCU-CHEK SOFTCLIX LANCETS MISC
3 refills | Status: DC
Start: 2022-08-19 — End: 2022-09-04
  Filled 2022-08-19: qty 100, 90d supply, fill #0

## 2022-08-20 ENCOUNTER — Other Ambulatory Visit (HOSPITAL_COMMUNITY): Payer: Self-pay

## 2022-08-21 ENCOUNTER — Other Ambulatory Visit (HOSPITAL_COMMUNITY): Payer: Self-pay

## 2022-08-21 ENCOUNTER — Other Ambulatory Visit (HOSPITAL_BASED_OUTPATIENT_CLINIC_OR_DEPARTMENT_OTHER): Payer: Self-pay

## 2022-09-04 ENCOUNTER — Ambulatory Visit: Payer: Federal, State, Local not specified - PPO | Admitting: Family

## 2022-09-04 DIAGNOSIS — I10 Essential (primary) hypertension: Secondary | ICD-10-CM | POA: Diagnosis not present

## 2022-09-04 DIAGNOSIS — R053 Chronic cough: Secondary | ICD-10-CM

## 2022-09-04 NOTE — Assessment & Plan Note (Addendum)
BP Readings from Last 3 Encounters:  09/04/22 (!) 146/93  07/13/22 134/88  05/23/22 (!) 150/105   BP is elevated today in the office. She reports ongoing extreme anxiety prior to medical appointments, especially primary care. I have advised her to purchase a blood pressure cuff and check blood pressure once daily at home for a few days and send me her readings via mychart. If home readings are elevated will plan to increase amlodipine dose.

## 2022-09-04 NOTE — Assessment & Plan Note (Signed)
Unchanged despite trial of omeprazole. Advised pt to d/c omeprazole and keep her upcoming appointment with pulmonology.

## 2022-09-04 NOTE — Progress Notes (Signed)
Subjective:   By signing my name below, I, Savannah Henry, attest that this documentation has been prepared under the direction and in the presence of Karie Chimera, NP 09/04/2022   Patient ID: Savannah Henry, female    DOB: [birth year], 41 y.o.   MRN: [id number]  Chief Complaint  Patient presents with   Follow-up    Here for follow up    HPI Patient is in today for an office visit  Chronic Cough: She is scheduled to see a pulmonologist on 09/06/2022. She was taking 40 Mg of Omeprazole but reports that the medication was not improving her symptoms Blood Pressure: As of today's visit, her blood pressure is elevating. She believes this elevating blood pressure is due to the anxiety of coming into the doctor's office.  BP Readings from Last 3 Encounters:  09/04/22 (!) 146/93  07/13/22 134/88  05/23/22 (!) 150/105   Pulse Readings from Last 3 Encounters:  09/04/22 (!) 111  07/13/22 (!) 104  05/23/22 (!) 110   A1C: Her A1C levels are normal  Lab Results  Component Value Date   HGBA1C 5.7 05/23/2022   Kidney and Liver: Her kidney and liver functions are stable Cholesterol: Her cholesterol levels are controlled Lab Results  Component Value Date   CHOL 185 05/23/2022   HDL 43.90 05/23/2022   LDLCALC 133 (H) 05/23/2022   TRIG 43.0 05/23/2022   CHOLHDL 4 05/23/2022   Immunizations: She is not interested in receiving an influenza vaccine during today's visit.  Mood: Mentally she feels good.   Health Maintenance Due  Topic Date Due   Hepatitis C Screening  Never done   COVID-19 Vaccine (4 - Pfizer series) 02/13/2021   OPHTHALMOLOGY EXAM  08/02/2022    Past Medical History:  Diagnosis Date   Anxiety    Asthma    no meds needed, maybe 1-2 times per year   Chicken pox    Dysrhythmia    palpitations with pregnancy   GERD (gastroesophageal reflux disease)    no meds   Hyperlipidemia    Obesity    PONV (postoperative nausea and vomiting)    Vaginal Pap smear,  abnormal 2000    Past Surgical History:  Procedure Laterality Date   CESAREAN SECTION  2005   CESAREAN SECTION N/A 08/06/2013   Procedure: REPEAT CESAREAN SECTION;  Surgeon: Shelly Bombard, MD;  Location: [facility];  Service: Obstetrics;  Laterality: N/A;   CESAREAN SECTION WITH BILATERAL TUBAL LIGATION N/A 12/31/2017   Procedure: CESAREAN SECTION WITH BILATERAL TUBAL LIGATION;  Surgeon: Truett Mainland, DO;  Location: [facility];  Service: Obstetrics;  Laterality: N/A;   CHOLECYSTECTOMY  2007   DILATION AND CURETTAGE OF UTERUS  12/18/2015   pt reported miscarriage   EXCISION METACARPAL MASS Right 09/21/2013   Procedure: RIGHT PALM EXCISION MASS;  Surgeon: Tennis Must, MD;  Location: [facility];  Service: Orthopedics;  Laterality: Right;    Family History  Problem Relation Age of Onset   Arthritis Mother    Breast cancer Mother 62   Hypertension Mother    ALS Father    Breast cancer Maternal Aunt    Lung cancer Maternal Uncle    Diabetes Paternal Aunt    Arthritis Maternal Grandmother    Breast cancer Maternal Grandmother    Heart disease Paternal Grandmother    Hypertension Paternal Grandmother    Diabetes Paternal Grandfather     Social History   Socioeconomic History  Marital status: Married    Spouse name: Not on file   Number of children: 2   Years of education: Not on file   Highest education level: Not on file  Occupational History   Occupation: Freight forwarder  Tobacco Use   Smoking status: Never   Smokeless tobacco: Never  Substance and Sexual Activity   Alcohol use: No   Drug use: No   Sexual activity: Yes    Partners: Male    Birth control/protection: None  Other Topics Concern   Not on file  Social History Narrative   Works for [employer]   Daughter- 2005   Daughter-2014   Engaged   Enjoys spending itme with her family, friends   Social Determinants of Radio broadcast assistant Strain: Not on file  Food Insecurity: Not  on file  Transportation Needs: Not on file  Physical Activity: Not on file  Stress: Not on file  Social Connections: Not on file  Intimate Partner Violence: Not on file    Outpatient Medications Prior to Visit  Medication Sig Dispense Refill   amLODipine (NORVASC) 5 MG tablet Take 1 tablet (5 mg total) by mouth daily. 90 tablet 1   Blood Glucose Monitoring Suppl (ACCU-CHEK GUIDE) w/Device KIT AS DIRECTED 1 kit 0   glucose blood (ACCU-CHEK GUIDE) test strip check glucose one time daily 100 strip 3   loratadine (CLARITIN) 10 MG tablet Take 1 tablet (10 mg total) by mouth daily. 30 tablet 11   omeprazole (PRILOSEC) 40 MG capsule Take 1 capsule (40 mg total) by mouth daily. 30 capsule 3   Accu-Chek Softclix Lancets lancets check glucose once daily 100 each 3   albuterol (VENTOLIN HFA) 108 (90 Base) MCG/ACT inhaler Inhale 2 puffs into the lungs every 6 (six) hours as needed for wheezing or shortness of breath. 25.5 g 1   No facility-administered medications prior to visit.    Allergies  Allergen Reactions   Other Nausea And Vomiting and Other (See Comments)    Anesthesia     ROS See HPI    Objective:    Physical Exam Constitutional:      General: She is not in acute distress.    Appearance: Normal appearance. She is not ill-appearing.  HENT:     Head: Normocephalic and atraumatic.     Right Ear: External ear normal.     Left Ear: External ear normal.  Eyes:     Extraocular Movements: Extraocular movements intact.     Pupils: Pupils are equal, round, and reactive to light.  Cardiovascular:     Rate and Rhythm: Normal rate and regular rhythm.     Heart sounds: Normal heart sounds. No murmur heard.    No gallop.  Pulmonary:     Effort: Pulmonary effort is normal. No respiratory distress.     Breath sounds: Rhonchi (Coarse) present. No wheezing or rales.  Skin:    General: Skin is warm and dry.  Neurological:     Mental Status: She is alert and oriented to person, place,  and time.  Psychiatric:        Mood and Affect: Mood normal.        Behavior: Behavior normal.        Judgment: Judgment normal.     BP (!) 146/93 (BP Location: Right Arm, Patient Position: Sitting, Cuff Size: Small)   Pulse (!) 111   Temp 98.3 F (36.8 C) (Oral)   Resp 16   Wt 164 lb (74.4  kg)   SpO2 100%   BMI 29.05 kg/m  Wt Readings from Last 3 Encounters:  09/04/22 164 lb (74.4 kg)  07/13/22 165 lb (74.8 kg)  05/23/22 164 lb (74.4 kg)       Assessment & Plan:   Problem List Items Addressed This Visit       Unprioritized   Hypertension    BP Readings from Last 3 Encounters:  09/04/22 (!) 146/93  07/13/22 134/88  05/23/22 (!) 150/105  BP is elevated today in the office. She reports ongoing extreme anxiety prior to medical appointments, especially primary care. I have advised her to purchase a blood pressure cuff and check blood pressure once daily at home for a few days and send me her readings via mychart. If home readings are elevated will plan to increase amlodipine dose.       Chronic cough    Unchanged despite trial of omeprazole. Advised pt to d/c omeprazole and keep her upcoming appointment with pulmonology.       No orders of the defined types were placed in this encounter.  22 minutes spent on today's visit. Time was spent counseling patient about cough and hypertension.   I, Nance Pear, NP, personally preformed the services described in this documentation.  All medical record entries made by the scribe were at my direction and in my presence.  I have reviewed the chart and discharge instructions (if applicable) and agree that the record reflects my personal performance and is accurate and complete. 09/04/2022   I,Amber Collins,acting as a Education administrator for Nance Pear, NP.,have documented all relevant documentation on the behalf of Nance Pear, NP,as directed by  Nance Pear, NP while in the presence of Nance Pear,  NP.    Nance Pear, NP

## 2022-09-06 ENCOUNTER — Ambulatory Visit: Payer: Federal, State, Local not specified - PPO | Admitting: Pulmonary Disease

## 2022-09-06 ENCOUNTER — Encounter: Payer: Self-pay | Admitting: Pulmonary Disease

## 2022-09-06 ENCOUNTER — Other Ambulatory Visit (HOSPITAL_BASED_OUTPATIENT_CLINIC_OR_DEPARTMENT_OTHER): Payer: Self-pay

## 2022-09-06 ENCOUNTER — Ambulatory Visit (INDEPENDENT_AMBULATORY_CARE_PROVIDER_SITE_OTHER): Payer: Federal, State, Local not specified - PPO

## 2022-09-06 VITALS — BP 124/68 | HR 81 | Ht 63.0 in

## 2022-09-06 DIAGNOSIS — R0602 Shortness of breath: Secondary | ICD-10-CM | POA: Diagnosis not present

## 2022-09-06 DIAGNOSIS — R0609 Other forms of dyspnea: Secondary | ICD-10-CM | POA: Diagnosis not present

## 2022-09-06 DIAGNOSIS — R053 Chronic cough: Secondary | ICD-10-CM

## 2022-09-06 DIAGNOSIS — J4540 Moderate persistent asthma, uncomplicated: Secondary | ICD-10-CM | POA: Diagnosis not present

## 2022-09-06 DIAGNOSIS — R059 Cough, unspecified: Secondary | ICD-10-CM | POA: Diagnosis not present

## 2022-09-06 MED ORDER — FLUTICASONE-SALMETEROL 230-21 MCG/ACT IN AERO
2.0000 | INHALATION_SPRAY | Freq: Two times a day (BID) | RESPIRATORY_TRACT | 12 refills | Status: AC
  Filled 2022-09-06: qty 12, 30d supply, fill #0

## 2022-09-06 MED ORDER — PREDNISONE 20 MG PO TABS
20.0000 mg | ORAL_TABLET | Freq: Every day | ORAL | 0 refills | Status: AC
  Filled 2022-09-06: qty 5, 5d supply, fill #0

## 2022-09-06 NOTE — Patient Instructions (Signed)
Nice to meet you  I think your symptoms of cough and intermittent shortness of breath and chest tightness are all related to uncontrolled asthma.  I recommend taking prednisone 20 mg daily for 5 days to help jumpstart recovery  I recommend a new, stronger inhaler.  Use Advair 2 puffs twice a day every day.  Rinse your mouth out with water after every use.  If there are any issues obtaining this related to cost or otherwise please let me know and we will look for a solution  Use Flovent 2 puffs twice a day every day until we get the new inhaler. Rinse out your mouth out with water after every use.  You can stop get the new inhaler.  Continue use albuterol 2 puffs as needed for shortness of breath, cough, or wheeze.  We will get a chest x-ray today just to make sure there without missing anything else, I think this will be reassuring.  Return to clinic in 3 months or sooner as needed with Dr. Silas Flood

## 2022-09-06 NOTE — Progress Notes (Signed)
_0  ID: Savannah Henry, female    DOB: [birth year], 41 y.o.   MRN: [id number]  Chief Complaint  Patient presents with   Consult    Pt is here for consult for chronic cough. Pt states that the cough has been occurring for about 3 months. Pt states no covid or flu when cough started. Pt states when the cough started that is when the air quality was bad. Pt is on albuterol as needed. Cough is off and on with being productive. Cough is more frequent during the day.     Referring provider: Debbrah Alar, NP  HPI:   41 y.o. woman whom are seen in consultation for evaluation of dyspnea exertion, chest tightness, chronic cough.  Most recent PCP note x3 reviewed.  Patient notes onset of symptoms several months ago.  May be back in the spring 2023.  Initial symptoms of significant and new shortness of breath, chest tightness.  She developed cough.  Usually productive.  Worse throughout the day.  Symptoms have largely been unchanged.  She used albuterol as needed with mild improvement in temporary symptoms.  Has been using mid dose Flovent HFA with mild improvement but ongoing and persistent symptoms.  Has been seen by PCP for the symptoms.  Was treated for potential nasal allergies with antihistamines etc.  No improvement.  Subsequently tried on PPI for possible GERD related cough.  No improvement.  This prompted pulmonary referral.  No other relieving or exacerbating factors.  No position make things better or worse.  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  Cough started after this.  No changes at home is in terms of pets, remodeling etc.  No chest imaging with the symptoms that I can see, she states she has had no chest imaging in the evaluation of the symptoms.  Most recent chest imaging 06/2021 that predated symptoms reveals on my review and interpretation clear lungs bilaterally.  PMH: Asthma, Seasonal allergies,  prediabetes Surgical history: C-section, cholecystectomy Family history: Mother with asthma, hypertension, breast cancer, father with ALS Social history: Never smoker, from [place], lives in [place], works for Psychologist, prison and probation services / Pulmonary Flowsheets:   ACT:      No data to display          MMRC:     No data to display          Epworth:      No data to display          Tests:   FENO:  No results found for: "NITRICOXIDE"  PFT:     No data to display          WALK:      No data to display          Imaging: No results found.  Lab Results: Personally reviewed CBC    Component Value Date/Time   WBC 7.4 07/08/2021 2145   RBC 4.92 07/08/2021 2145   HGB 12.7 07/08/2021 2145   HGB 9.8 (L) 10/17/2017 0848   HGB 12.6 01/12/2013 0000   HCT 40.2 07/08/2021 2145   HCT 30.2 (L) 10/17/2017 0848   HCT 38 01/12/2013 0000   PLT 344 07/08/2021 2145   PLT 257 10/17/2017 0848   PLT 297 01/12/2013 0000   MCV 81.7 07/08/2021 2145   MCV 81 10/17/2017 0848   MCH 25.8 (L) 07/08/2021 2145   MCHC 31.6 07/08/2021 2145  RDW 13.6 07/08/2021 2145   RDW 14.5 10/17/2017 0848   LYMPHSABS 1.9 05/05/2021 1230   LYMPHSABS 2.6 06/05/2017 1645   MONOABS 0.7 05/05/2021 1230   EOSABS 0.2 05/05/2021 1230   EOSABS 0.1 06/05/2017 1645   BASOSABS 0.0 05/05/2021 1230   BASOSABS 0.0 06/05/2017 1645    BMET    Component Value Date/Time   NA 138 05/23/2022 0850   K 3.8 05/23/2022 0850   CL 105 05/23/2022 0850   CO2 24 05/23/2022 0850   GLUCOSE 92 05/23/2022 0850   BUN 9 05/23/2022 0850   CREATININE 0.60 05/23/2022 0850   CREATININE 0.58 05/23/2016 1625   CALCIUM 9.1 05/23/2022 0850   GFRNONAA >60 07/08/2021 2145   GFRNONAA >89 05/23/2016 1625   GFRAA >89 05/23/2016 1625    BNP No results found for: "BNP"  ProBNP No results found for: "PROBNP"  Specialty Problems       Pulmonary Problems   Uncontrolled moderate  persistent asthma   Chronic cough    Allergies  Allergen Reactions   Other Nausea And Vomiting and Other (See Comments)    Anesthesia     Immunization History  Administered Date(s) Administered   Influenza Split 12/30/2012   Influenza,inj,Quad PF,6+ Mos 12/04/2017   PFIZER(Purple Top)SARS-COV-2 Vaccination 03/10/2020, 03/31/2020, 12/19/2020   Tdap 06/08/2013, 12/04/2017    Past Medical History:  Diagnosis Date   Anxiety    Asthma    no meds needed, maybe 1-2 times per year   Chicken pox    Dysrhythmia    palpitations with pregnancy   GERD (gastroesophageal reflux disease)    no meds   Hyperlipidemia    Obesity    PONV (postoperative nausea and vomiting)    Vaginal Pap smear, abnormal 2000    Tobacco History: Social History   Tobacco Use  Smoking Status Never  Smokeless Tobacco Never   Counseling given: Not Answered   Continue to not smoke  Outpatient Encounter Medications as of 09/06/2022  Medication Sig   amLODipine (NORVASC) 5 MG tablet Take 1 tablet (5 mg total) by mouth daily.   Blood Glucose Monitoring Suppl (ACCU-CHEK GUIDE) w/Device KIT AS DIRECTED   fluticasone (FLOVENT HFA) 110 MCG/ACT inhaler    fluticasone-salmeterol (ADVAIR HFA) 230-21 MCG/ACT inhaler Inhale 2 puffs into the lungs 2 (two) times daily.   glucose blood (ACCU-CHEK GUIDE) test strip check glucose one time daily   loratadine (CLARITIN) 10 MG tablet Take 1 tablet (10 mg total) by mouth daily.   predniSONE (DELTASONE) 20 MG tablet Take 1 tablet (20 mg total) by mouth daily with breakfast for 5 days.   No facility-administered encounter medications on file as of 09/06/2022.     Review of Systems  Review of Systems   Physical Exam  BP 124/68 (BP Location: Left Arm, Patient Position: Sitting, Cuff Size: Normal)   Pulse 81   Ht _0  (1.6 m)   SpO2 99%   BMI 29.05 kg/m   Wt Readings from Last 5 Encounters:  09/04/22 164 lb (74.4 kg)  07/13/22 165 lb (74.8 kg)  05/23/22 164 lb  (74.4 kg)  07/08/21 192 lb 14.4 oz (87.5 kg)  05/23/21 207 lb 12.8 oz (94.3 kg)    BMI Readings from Last 5 Encounters:  09/06/22 29.05 kg/m  09/04/22 29.05 kg/m  07/13/22 29.23 kg/m  05/23/22 29.05 kg/m  07/08/21 34.17 kg/m     Physical Exam General: Sitting in chair, no acute distress Eyes: EOMI, no icterus Neck: Supple, no JVP  Pulmonary: Diffuse in expiratory wheeze with mild improvement after coughing but persistence, normal work of breathing, on room air Cardiovascular: Warm, no edema Abdomen: Nondistended, bowel sounds present MSK: No synovitis, no joint effusion Neuro: Normal gait, no weakness Psych: Oriented,   Assessment & Plan:   Chronic cough: With preceding asthma.  Worsened with what sounds like worsening air quality.  Present for months.  No response to treatment of postnasal drip, seasonal allergies nor treatment of GERD with PPI.  Wheezing on exam today suspect related to asthma.  Chest x-ray today.  Wheezing, chest tightness, shortness of breath: With preceding diagnosis of asthma.  Worse with environmental factors, returning to work, air quality.  High suspicion for poorly controlled asthma.    Moderate persistent asthma: Diagnosed as a child.  Intermittent use of albuterol in the past.  Worse over the last several months with consistent symptoms of dyspnea, chest tightness, cough.  Wheezing on exam.  Stop Flovent.  Start high-dose Advair HFA 2 puff twice daily.  Continue albuterol as needed.  Prednisone 20 mg daily for 5 days given wheeze on exam and persistence, duration of symptoms.  Chest x-ray as above.   Return in about 3 months (around 12/06/2022).   Lanier Clam, MD 09/06/2022

## 2022-09-07 ENCOUNTER — Other Ambulatory Visit (HOSPITAL_BASED_OUTPATIENT_CLINIC_OR_DEPARTMENT_OTHER): Payer: Self-pay

## 2022-09-07 NOTE — Progress Notes (Signed)
Chest xray is clear

## 2022-09-17 ENCOUNTER — Telehealth: Payer: Self-pay | Admitting: Family

## 2022-09-17 ENCOUNTER — Encounter: Payer: Self-pay | Admitting: Family

## 2022-09-17 NOTE — Telephone Encounter (Signed)
Patient called in expressing frustration over her dismissal from [facility]. She stated that the letter is worded as though she has done something wrong. I explained to her that Melissa chose to terminate the patient provider relationship based on the fact that Ms Stamour does not feel that Lenna Sciara is working in her best interest, and it has caused a loss of trust between the two of them. This is so she can start fresh with a different provider and maybe not be so anxious in her visits.   She wanted to talk with somebody else regarding the dismissal so I've provided her with the information to reach Patient Experience

## 2022-11-19 ENCOUNTER — Other Ambulatory Visit (HOSPITAL_BASED_OUTPATIENT_CLINIC_OR_DEPARTMENT_OTHER): Payer: Self-pay

## 2022-11-19 ENCOUNTER — Other Ambulatory Visit: Payer: Self-pay | Admitting: Family

## 2022-11-19 ENCOUNTER — Encounter: Payer: Self-pay | Admitting: Pulmonary Disease

## 2022-11-19 ENCOUNTER — Ambulatory Visit: Payer: Federal, State, Local not specified - PPO | Admitting: Pulmonary Disease

## 2022-11-19 ENCOUNTER — Ambulatory Visit (INDEPENDENT_AMBULATORY_CARE_PROVIDER_SITE_OTHER): Payer: Federal, State, Local not specified - PPO | Admitting: Pulmonary Disease

## 2022-11-19 VITALS — BP 130/68 | HR 94 | Wt 163.8 lb

## 2022-11-19 DIAGNOSIS — I10 Essential (primary) hypertension: Secondary | ICD-10-CM

## 2022-11-19 DIAGNOSIS — J4540 Moderate persistent asthma, uncomplicated: Secondary | ICD-10-CM | POA: Diagnosis not present

## 2022-11-19 DIAGNOSIS — R0609 Other forms of dyspnea: Secondary | ICD-10-CM | POA: Diagnosis not present

## 2022-11-19 LAB — PULMONARY FUNCTION TEST
DL/VA % pred: 149 %
DL/VA: 6.67 ml/min/mmHg/L
DLCO cor % pred: 123 %
DLCO cor: 25.9 ml/min/mmHg
DLCO unc % pred: 123 %
DLCO unc: 25.9 ml/min/mmHg
FEF 25-75 Post: 3.2 L/sec
FEF 25-75 Pre: 2.63 L/sec
FEF2575-%Change-Post: 21 %
FEF2575-%Pred-Post: 105 %
FEF2575-%Pred-Pre: 86 %
FEV1-%Change-Post: 4 %
FEV1-%Pred-Post: 86 %
FEV1-%Pred-Pre: 82 %
FEV1-Post: 2.52 L
FEV1-Pre: 2.4 L
FEV1FVC-%Change-Post: 2 %
FEV1FVC-%Pred-Pre: 105 %
FEV6-%Change-Post: 2 %
FEV6-%Pred-Post: 81 %
FEV6-%Pred-Pre: 79 %
FEV6-Post: 2.84 L
FEV6-Pre: 2.77 L
FEV6FVC-%Pred-Post: 101 %
FEV6FVC-%Pred-Pre: 101 %
FVC-%Change-Post: 2 %
FVC-%Pred-Post: 79 %
FVC-%Pred-Pre: 77 %
FVC-Post: 2.84 L
FVC-Pre: 2.77 L
Post FEV1/FVC ratio: 89 %
Post FEV6/FVC ratio: 100 %
Pre FEV1/FVC ratio: 87 %
Pre FEV6/FVC Ratio: 100 %
RV % pred: 43 %
RV: 0.68 L
TLC % pred: 71 %
TLC: 3.53 L

## 2022-11-19 NOTE — Patient Instructions (Signed)
Full PFT performed today. °

## 2022-11-19 NOTE — Progress Notes (Signed)
_0  ID: [name], female    DOB: 19-Jan-1981, 41 y.o.   MRN: [id number]  Chief Complaint  Patient presents with   Follow-up    Pt is here for follow up for DOE. Pt had full PFTs done today. Pt states that the [place] is working well for her. She states that she got a recent cold and that started her coughing again. But she states before that she has been doing well.     Referring provider: Debbrah Alar, NP  HPI:   41 y.o. woman whom are seen in follow up okay for evaluation of dyspnea exertion, chest tightness, chronic cough elderly related to asthma.    Doing well.  Took prednisone at last visit and was prescribed high-dose Advair, escalate from Flovent.  Since taking the prednisone really wiped out her symptoms.  Tightness, cough shortness of breath have gotten much better.  She reports adherence to Advair HFA high-dose twice daily.  Has maintained good symptom control on this.  Recently contracted what sounds like upper respiratory illness.  She states her breathing and her lungs feel fine.  Little bit of cough preventing a lot of nasal and sinus congestion.  Lungs feel fine.  She had PFTs performed today that we reviewed.  These are largely normal.  See below for full interpretation.  HPI at initial visit: Patient notes onset of symptoms several months ago.  May be back in the spring 2023.  Initial symptoms of significant and new shortness of breath, chest tightness.  She developed cough.  Usually productive.  Worse throughout the day.  Symptoms have largely been unchanged.  She used albuterol as needed with mild improvement in temporary symptoms.  Has been using mid dose Flovent HFA with mild improvement but ongoing and persistent symptoms.  Has been seen by PCP for the symptoms.  Was treated for potential nasal allergies with antihistamines etc.  No improvement.  Subsequently tried on PPI for possible GERD related cough.  No improvement.  This prompted pulmonary  referral.  No other relieving or exacerbating factors.  No position make things better or worse.  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  Cough started after this.  No changes at home is in terms of pets, remodeling etc.  No chest imaging with the symptoms that I can see, she states she has had no chest imaging in the evaluation of the symptoms.  Most recent chest imaging 06/2021 that predated symptoms reveals on my review and interpretation clear lungs bilaterally.  PMH: Asthma, Seasonal allergies, prediabetes Surgical history: C-section, cholecystectomy Family history: Mother with asthma, hypertension, breast cancer, father with ALS Social history: Never smoker, from [place], lives in [place], works for [employer], prison and probation services / Pulmonary Flowsheets:   ACT:      No data to display           MMRC:     No data to display           Epworth:      No data to display           Tests:   FENO:  No results found for: "NITRICOXIDE"  PFT:    Latest Ref Rng & Units 11/19/2022   11:51 AM  PFT Results  FVC-Pre L 2.77  P  FVC-Predicted Pre % 77  P  FVC-Post L 2.84  P  FVC-Predicted Post % 79  P  Pre FEV1/FVC % % 87  P  Post FEV1/FCV % % 89  P  FEV1-Pre L 2.40  P  FEV1-Predicted Pre % 82  P  FEV1-Post L 2.52  P  DLCO uncorrected ml/min/mmHg 25.90  P  DLCO UNC% % 123  P  DLCO corrected ml/min/mmHg 25.90  P  DLCO COR %Predicted % 123  P  DLVA Predicted % 149  P  TLC L 3.53  P  TLC % Predicted % 71  P  RV % Predicted % 43  P    P Preliminary result   Personally reviewed and interpreted as spirometry just with mild restriction versus air trapping.  No bronchodilator spots.  Lung volumes reveal mild restriction.  No air trapping.  DLCO within normal limits.  WALK:      No data to display           Imaging: Personally reviewed and as per EMR discussion this note No  results found.  Lab Results: Personally reviewed CBC    Component Value Date/Time   WBC 7.4 07/08/2021 2145   RBC 4.92 07/08/2021 2145   HGB 12.7 07/08/2021 2145   HGB 9.8 (L) 10/17/2017 0848   HGB 12.6 01/12/2013 0000   HCT 40.2 07/08/2021 2145   HCT 30.2 (L) 10/17/2017 0848   HCT 38 01/12/2013 0000   PLT 344 07/08/2021 2145   PLT 257 10/17/2017 0848   PLT 297 01/12/2013 0000   MCV 81.7 07/08/2021 2145   MCV 81 10/17/2017 0848   MCH 25.8 (L) 07/08/2021 2145   MCHC 31.6 07/08/2021 2145   RDW 13.6 07/08/2021 2145   RDW 14.5 10/17/2017 0848   LYMPHSABS 1.9 05/05/2021 1230   LYMPHSABS 2.6 06/05/2017 1645   MONOABS 0.7 05/05/2021 1230   EOSABS 0.2 05/05/2021 1230   EOSABS 0.1 06/05/2017 1645   BASOSABS 0.0 05/05/2021 1230   BASOSABS 0.0 06/05/2017 1645    BMET    Component Value Date/Time   NA 138 05/23/2022 0850   K 3.8 05/23/2022 0850   CL 105 05/23/2022 0850   CO2 24 05/23/2022 0850   GLUCOSE 92 05/23/2022 0850   BUN 9 05/23/2022 0850   CREATININE 0.60 05/23/2022 0850   CREATININE 0.58 05/23/2016 1625   CALCIUM 9.1 05/23/2022 0850   GFRNONAA >60 07/08/2021 2145   GFRNONAA >89 05/23/2016 1625   GFRAA >89 05/23/2016 1625    BNP No results found for: "BNP"  ProBNP No results found for: "PROBNP"  Specialty Problems       Pulmonary Problems   Uncontrolled moderate persistent asthma   Chronic cough    Allergies  Allergen Reactions   Other Nausea And Vomiting and Other (See Comments)    Anesthesia     Immunization History  Administered Date(s) Administered   Influenza Split 12/30/2012   Influenza,inj,Quad PF,6+ Mos 12/04/2017   PFIZER(Purple Top)SARS-COV-2 Vaccination 03/10/2020, 03/31/2020, 12/19/2020   Tdap 06/08/2013, 12/04/2017    Past Medical History:  Diagnosis Date   Anxiety    Asthma    no meds needed, maybe 1-2 times per year   Chicken pox    Dysrhythmia    palpitations with pregnancy   GERD (gastroesophageal reflux disease)     no meds   Hyperlipidemia    Obesity    PONV (postoperative nausea and vomiting)    Vaginal Pap smear, abnormal 2000    Tobacco History: Social History   Tobacco Use  Smoking Status Never  Smokeless Tobacco Never   Counseling given: Not  Answered   Continue to not smoke  Outpatient Encounter Medications as of 11/19/2022  Medication Sig   amLODipine (NORVASC) 5 MG tablet Take 1 tablet (5 mg total) by mouth daily.   Blood Glucose Monitoring Suppl (ACCU-CHEK GUIDE) w/Device KIT AS DIRECTED   fluticasone (FLOVENT HFA) 110 MCG/ACT inhaler    fluticasone-salmeterol (ADVAIR HFA) 230-21 MCG/ACT inhaler Inhale 2 puffs into the lungs 2 (two) times daily.   glucose blood (ACCU-CHEK GUIDE) test strip check glucose one time daily   [DISCONTINUED] loratadine (CLARITIN) 10 MG tablet Take 1 tablet (10 mg total) by mouth daily.   No facility-administered encounter medications on file as of 11/19/2022.     Review of Systems  Review of Systems  N/a Physical Exam  BP 130/68 (BP Location: Left Arm, Patient Position: Sitting, Cuff Size: Normal)   Pulse 94   Wt 163 lb 12.8 oz (74.3 kg)   SpO2 96%   BMI 29.02 kg/m   Wt Readings from Last 5 Encounters:  11/19/22 163 lb 12.8 oz (74.3 kg)  09/04/22 164 lb (74.4 kg)  07/13/22 165 lb (74.8 kg)  05/23/22 164 lb (74.4 kg)  07/08/21 192 lb 14.4 oz (87.5 kg)    BMI Readings from Last 5 Encounters:  11/19/22 29.02 kg/m  09/06/22 29.05 kg/m  09/04/22 29.05 kg/m  07/13/22 29.23 kg/m  05/23/22 29.05 kg/m     Physical Exam General: Sitting in chair, no acute distress Eyes: EOMI, no icterus Neck: Supple, no JVP Pulmonary: Clear, normal work of breathing, on room air Cardiovascular: Warm, no edema Abdomen: Nondistended, bowel sounds present MSK: No synovitis, no joint effusion Neuro: Normal gait, no weakness Psych: Normal mood, full affect,   Assessment & Plan:   Chronic cough: With preceding asthma.  Worsened with what sounds  like worsening air quality.  Present for months.  No response to treatment of postnasal drip, seasonal allergies nor treatment of GERD with PPI.  Wheezing on initial exam, suspect related to asthma.  Chest x-ray 08/2022 clear.  Symptoms essentially resolved with prednisone and ICS/LABA therapy.  Wheezing, chest tightness, shortness of breath: With preceding diagnosis of asthma.  Worse with environmental factors, returning to work, air quality.  High suspicion for poorly controlled asthma.  Symptoms essentially resolved with prednisone and ICS/LABA therapy.  PFTs 11/2022 with mild restriction likely related to extrathoracic restriction with normal DLCO.  No further work-up recommended.  Moderate persistent asthma: Diagnosed as a child.  Intermittent use of albuterol in the past.  Worse over the last several months with consistent symptoms of dyspnea, chest tightness, cough.  Wheezing on initial exam.  Escalate inhaler ICS to number high-dose Advair HFA 2 puff twice daily 08/2022.  Symptoms well controlled.  Continue Advair and albuterol as needed.    Return in about 3 months (around 02/18/2023).   Lanier Clam, MD 11/19/2022

## 2022-11-19 NOTE — Progress Notes (Signed)
Full PFT performed today. °

## 2022-11-19 NOTE — Patient Instructions (Addendum)
Nice to see you again  I am glad you are feeling better  Continue the Advair 2 puffs twice a day every day.  Rinse your mouth out with water after every use.  No changes to medication  Return to clinic in 6 months or sooner as needed with Dr. Silas Flood

## 2023-02-19 DIAGNOSIS — I10 Essential (primary) hypertension: Secondary | ICD-10-CM | POA: Diagnosis not present

## 2023-07-01 DIAGNOSIS — E119 Type 2 diabetes mellitus without complications: Secondary | ICD-10-CM | POA: Diagnosis not present

## 2023-07-02 DIAGNOSIS — E669 Obesity, unspecified: Secondary | ICD-10-CM | POA: Diagnosis not present

## 2023-07-02 DIAGNOSIS — I10 Essential (primary) hypertension: Secondary | ICD-10-CM | POA: Diagnosis not present

## 2023-07-02 DIAGNOSIS — R7301 Impaired fasting glucose: Secondary | ICD-10-CM | POA: Diagnosis not present

## 2023-07-02 DIAGNOSIS — E6609 Other obesity due to excess calories: Secondary | ICD-10-CM | POA: Diagnosis not present

## 2023-07-09 ENCOUNTER — Other Ambulatory Visit (HOSPITAL_BASED_OUTPATIENT_CLINIC_OR_DEPARTMENT_OTHER): Payer: Self-pay | Admitting: Nurse Practitioner

## 2023-07-09 DIAGNOSIS — Z1231 Encounter for screening mammogram for malignant neoplasm of breast: Secondary | ICD-10-CM

## 2023-07-11 ENCOUNTER — Encounter (HOSPITAL_BASED_OUTPATIENT_CLINIC_OR_DEPARTMENT_OTHER): Payer: Self-pay

## 2023-07-11 ENCOUNTER — Ambulatory Visit (HOSPITAL_BASED_OUTPATIENT_CLINIC_OR_DEPARTMENT_OTHER)
Admission: RE | Admit: 2023-07-11 | Discharge: 2023-07-11 | Disposition: A | Discharge status: Home / Self Care | Payer: Federal, State, Local not specified - PPO | Source: Ambulatory Visit | Attending: Nurse Practitioner | Admitting: Nurse Practitioner

## 2023-07-11 DIAGNOSIS — Z1231 Encounter for screening mammogram for malignant neoplasm of breast: Secondary | ICD-10-CM | POA: Diagnosis not present

## 2023-10-01 DIAGNOSIS — Z0001 Encounter for general adult medical examination with abnormal findings: Secondary | ICD-10-CM | POA: Diagnosis not present

## 2023-10-01 DIAGNOSIS — R7301 Impaired fasting glucose: Secondary | ICD-10-CM | POA: Diagnosis not present

## 2023-10-01 DIAGNOSIS — Z124 Encounter for screening for malignant neoplasm of cervix: Secondary | ICD-10-CM | POA: Diagnosis not present

## 2023-10-01 DIAGNOSIS — E6609 Other obesity due to excess calories: Secondary | ICD-10-CM | POA: Diagnosis not present

## 2023-10-01 DIAGNOSIS — E669 Obesity, unspecified: Secondary | ICD-10-CM | POA: Diagnosis not present

## 2023-10-01 DIAGNOSIS — E559 Vitamin D deficiency, unspecified: Secondary | ICD-10-CM | POA: Diagnosis not present

## 2023-10-01 DIAGNOSIS — I10 Essential (primary) hypertension: Secondary | ICD-10-CM | POA: Diagnosis not present

## 2024-05-05 IMAGING — MG MM DIGITAL SCREENING BILAT W/ TOMO AND CAD
6 of 10 series · 6 of 30 positions shown · non-contrast
Comparison: None available.

CLINICAL DATA: Screening.

EXAM:
DIGITAL SCREENING BILATERAL MAMMOGRAM WITH TOMOSYNTHESIS AND CAD
TECHNIQUE: Bilateral screening digital craniocaudal and mediolateral oblique
mammograms were obtained. Bilateral screening digital breast
tomosynthesis was performed. The images were evaluated with
computer-aided detection.

[R MLO synth-2D (1 of 2)]
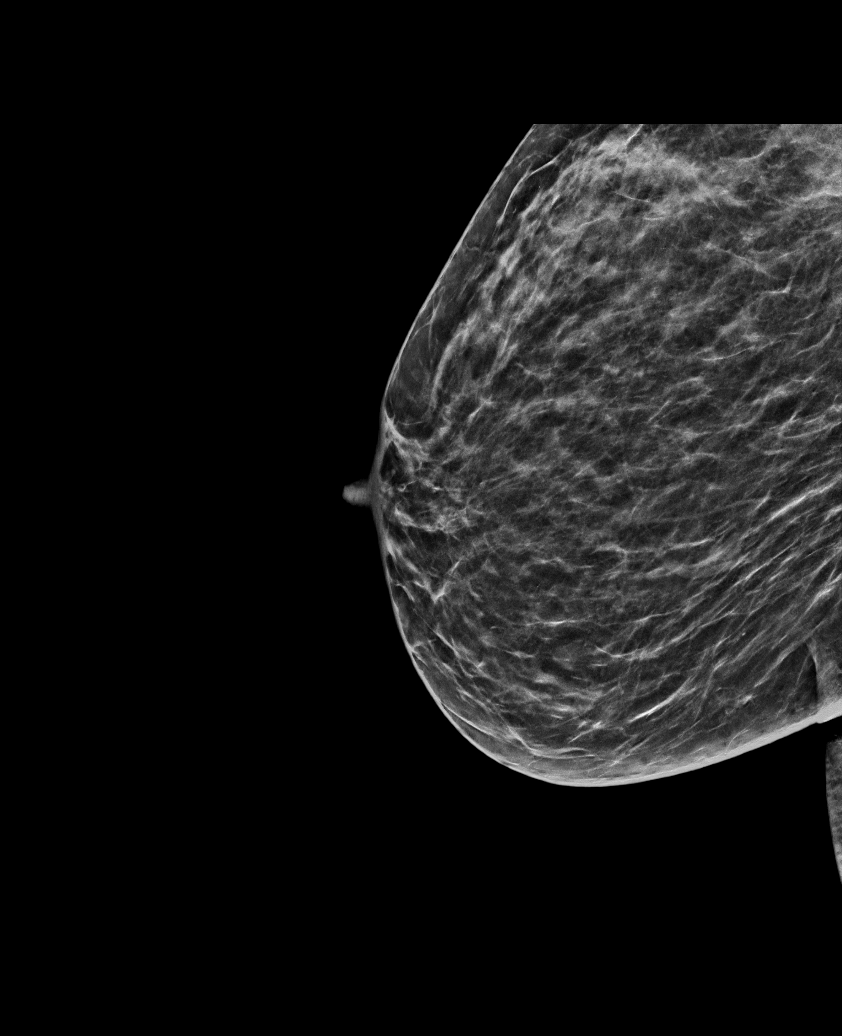

[R MLO synth-2D (2 of 2)]
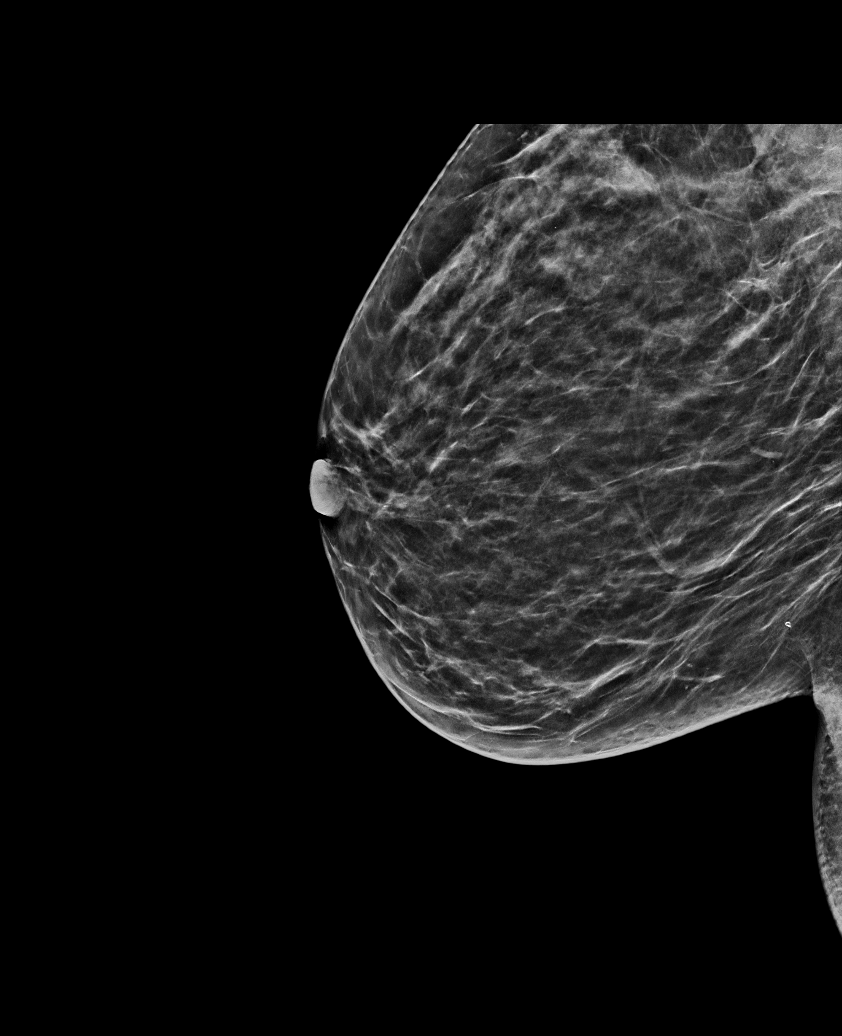

[R CC synth-2D]
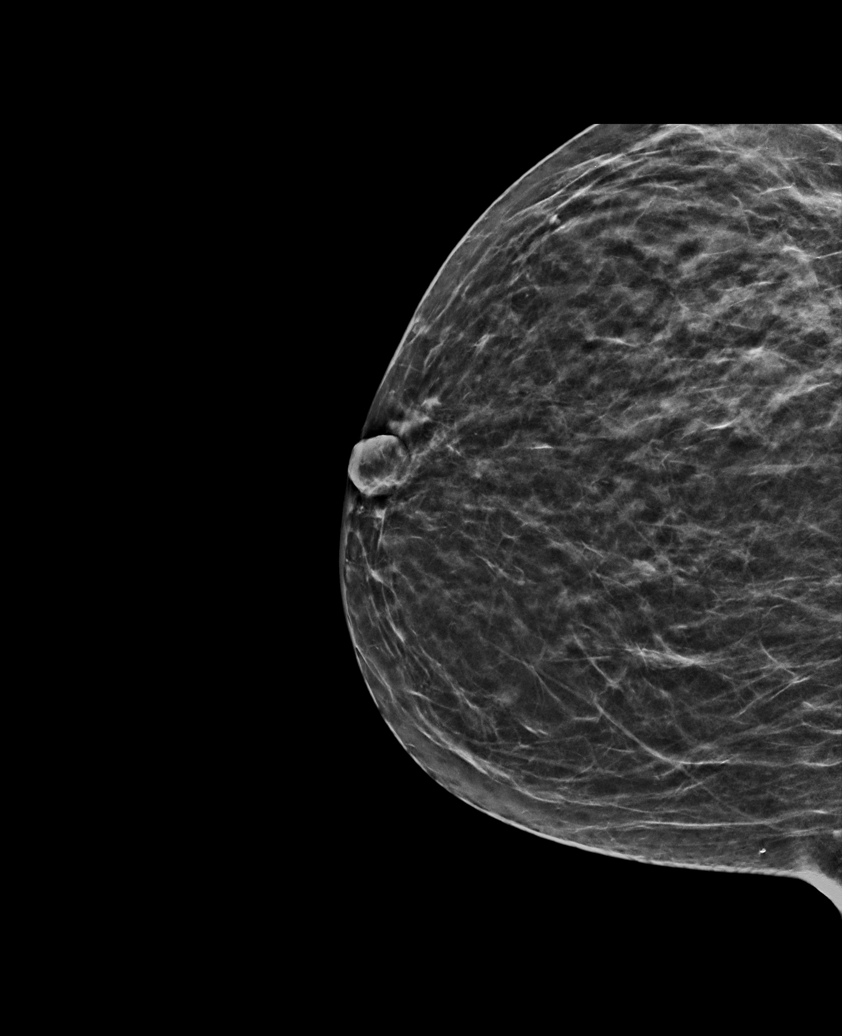

[L MLO synth-2D]
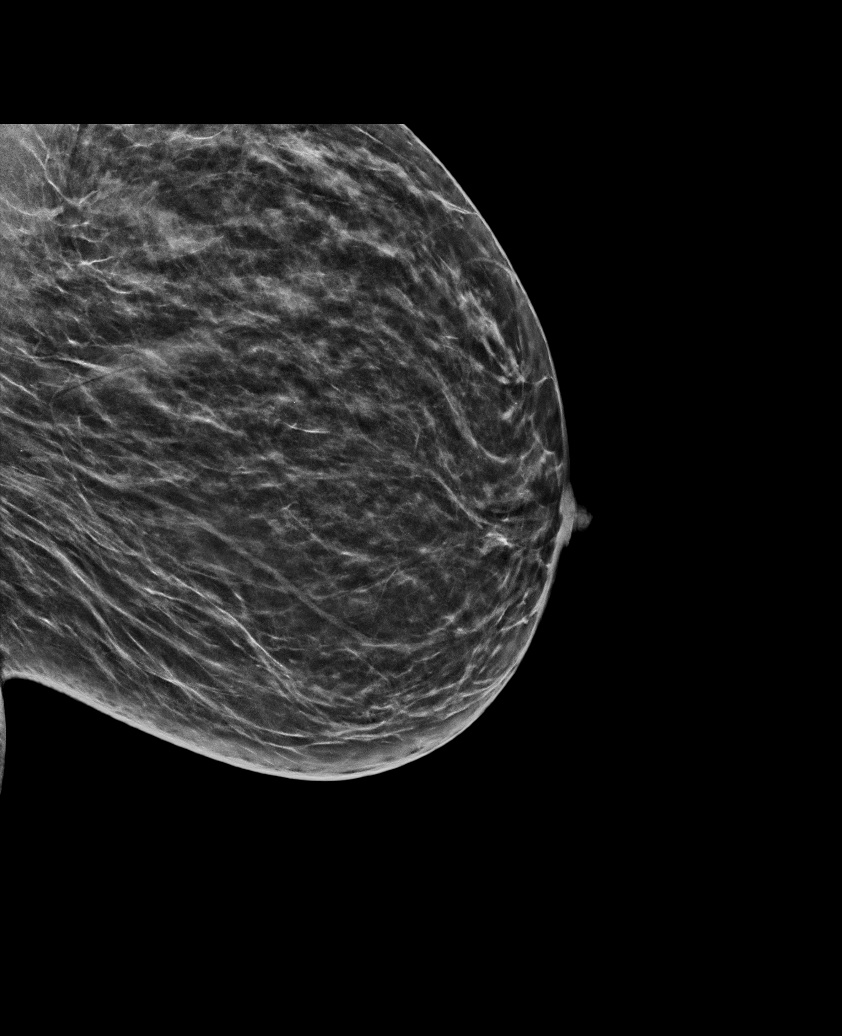

[L CC synth-2D]
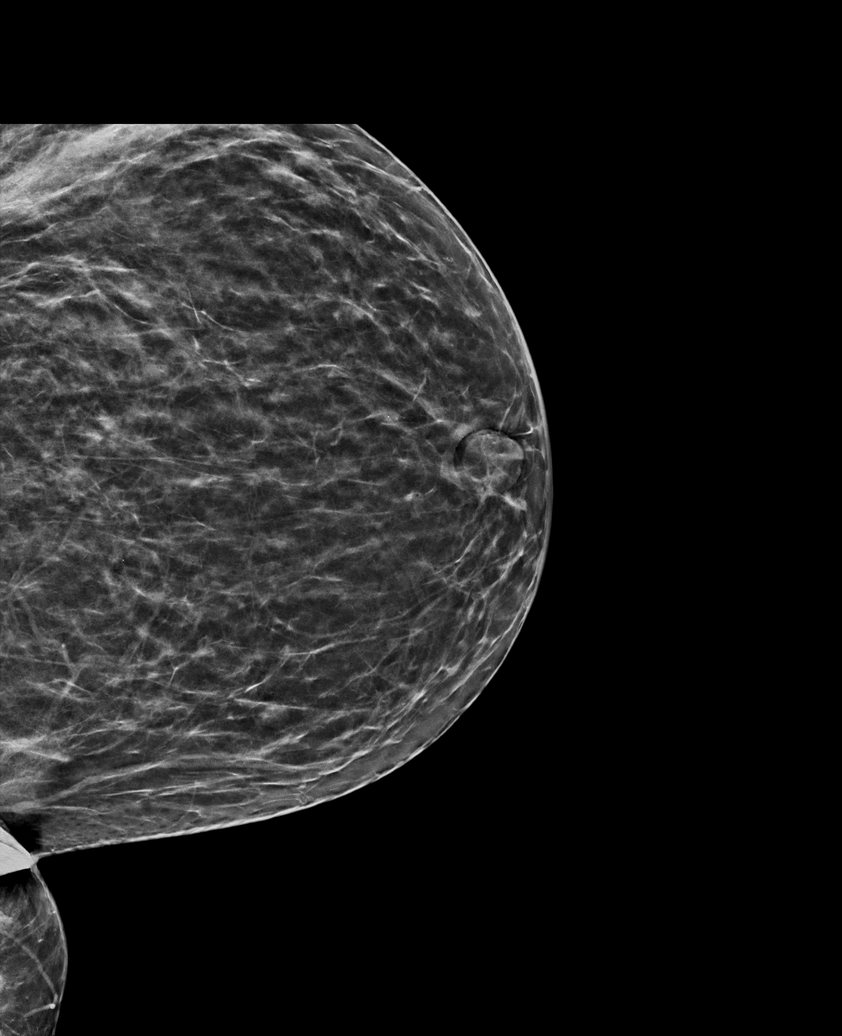

[L CC tomo · tomo slice 27/54.0]
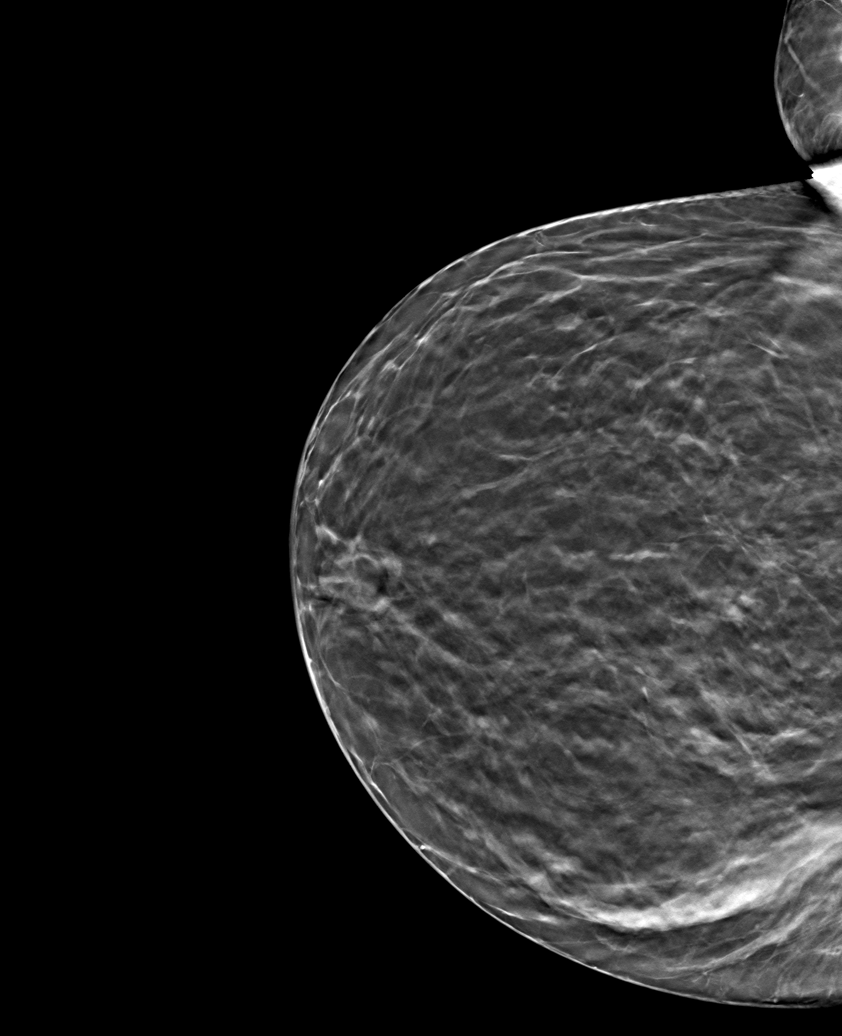

[6 of 30 positions shown; findings below may reference images not displayed]

ACR Breast Density Category b: There are scattered areas of
fibroglandular density.
FINDINGS: There are no findings suspicious for malignancy.
IMPRESSION: No mammographic evidence of malignancy. A result letter of this
screening mammogram will be mailed directly to the patient.

RECOMMENDATION:
Screening mammogram in one year. (Code:GB-Z-FIU)

BI-RADS CATEGORY  1: Negative.

## 2024-12-28 ENCOUNTER — Other Ambulatory Visit (HOSPITAL_BASED_OUTPATIENT_CLINIC_OR_DEPARTMENT_OTHER): Payer: Self-pay | Admitting: Nurse Practitioner

## 2024-12-28 DIAGNOSIS — Z1231 Encounter for screening mammogram for malignant neoplasm of breast: Secondary | ICD-10-CM

## 2024-12-31 ENCOUNTER — Ambulatory Visit (HOSPITAL_BASED_OUTPATIENT_CLINIC_OR_DEPARTMENT_OTHER)
Admission: RE | Admit: 2024-12-31 | Discharge: 2024-12-31 | Disposition: A | Discharge status: Home / Self Care | Source: Ambulatory Visit | Attending: Nurse Practitioner | Admitting: Nurse Practitioner

## 2024-12-31 ENCOUNTER — Encounter (HOSPITAL_BASED_OUTPATIENT_CLINIC_OR_DEPARTMENT_OTHER): Payer: Self-pay

## 2024-12-31 DIAGNOSIS — Z1231 Encounter for screening mammogram for malignant neoplasm of breast: Secondary | ICD-10-CM | POA: Insufficient documentation
